# Patient Record
Sex: Female | Born: 1975
Health system: Southern US, Community
[De-identification: ages and names within clinical notes are randomized; demographics above are authoritative.]

## PROBLEM LIST (undated history)

## (undated) DIAGNOSIS — N289 Disorder of kidney and ureter, unspecified: Secondary | ICD-10-CM

## (undated) DIAGNOSIS — E282 Polycystic ovarian syndrome: Secondary | ICD-10-CM

## (undated) DIAGNOSIS — G5602 Carpal tunnel syndrome, left upper limb: Secondary | ICD-10-CM

## (undated) DIAGNOSIS — F329 Major depressive disorder, single episode, unspecified: Secondary | ICD-10-CM

## (undated) DIAGNOSIS — Z87442 Personal history of urinary calculi: Secondary | ICD-10-CM

## (undated) DIAGNOSIS — R05 Cough: Secondary | ICD-10-CM

## (undated) DIAGNOSIS — G44229 Chronic tension-type headache, not intractable: Secondary | ICD-10-CM

## (undated) DIAGNOSIS — E119 Type 2 diabetes mellitus without complications: Secondary | ICD-10-CM

## (undated) DIAGNOSIS — G8929 Other chronic pain: Secondary | ICD-10-CM

## (undated) DIAGNOSIS — I10 Essential (primary) hypertension: Secondary | ICD-10-CM

## (undated) DIAGNOSIS — M545 Low back pain, unspecified: Secondary | ICD-10-CM

## (undated) DIAGNOSIS — E785 Hyperlipidemia, unspecified: Secondary | ICD-10-CM

## (undated) DIAGNOSIS — F32A Depression, unspecified: Secondary | ICD-10-CM

## (undated) DIAGNOSIS — G43909 Migraine, unspecified, not intractable, without status migrainosus: Secondary | ICD-10-CM

## (undated) DIAGNOSIS — F419 Anxiety disorder, unspecified: Secondary | ICD-10-CM

## (undated) HISTORY — PX: WISDOM TOOTH EXTRACTION: SHX21

## (undated) HISTORY — DX: Hyperlipidemia, unspecified: E78.5

## (undated) HISTORY — DX: Migraine, unspecified, not intractable, without status migrainosus: G43.909

---

## 1998-11-06 ENCOUNTER — Other Ambulatory Visit: Admission: RE | Admit: 1998-11-06 | Discharge: 1998-11-06 | Payer: Self-pay | Admitting: Obstetrics & Gynecology

## 2002-09-26 ENCOUNTER — Encounter: Payer: Self-pay | Admitting: Family Medicine

## 2002-09-26 ENCOUNTER — Encounter: Admission: RE | Admit: 2002-09-26 | Discharge: 2002-09-26 | Payer: Self-pay | Admitting: Family Medicine

## 2003-01-30 ENCOUNTER — Other Ambulatory Visit: Admission: RE | Admit: 2003-01-30 | Discharge: 2003-01-30 | Payer: Self-pay | Admitting: Obstetrics and Gynecology

## 2004-03-30 ENCOUNTER — Other Ambulatory Visit: Admission: RE | Admit: 2004-03-30 | Discharge: 2004-03-30 | Payer: Self-pay | Admitting: Obstetrics and Gynecology

## 2005-12-30 ENCOUNTER — Emergency Department (HOSPITAL_COMMUNITY): Admission: EM | Admit: 2005-12-30 | Discharge: 2005-12-30 | Payer: Self-pay | Admitting: Pediatrics

## 2006-05-26 ENCOUNTER — Other Ambulatory Visit: Admission: RE | Admit: 2006-05-26 | Discharge: 2006-05-26 | Payer: Self-pay | Admitting: Obstetrics and Gynecology

## 2006-10-07 ENCOUNTER — Inpatient Hospital Stay (HOSPITAL_COMMUNITY): Admission: AD | Admit: 2006-10-07 | Discharge: 2006-10-07 | Payer: Self-pay | Admitting: Obstetrics and Gynecology

## 2007-01-13 ENCOUNTER — Observation Stay (HOSPITAL_COMMUNITY): Admission: AD | Admit: 2007-01-13 | Discharge: 2007-01-14 | Payer: Self-pay | Admitting: Obstetrics and Gynecology

## 2007-04-19 ENCOUNTER — Ambulatory Visit (HOSPITAL_COMMUNITY): Admission: RE | Admit: 2007-04-19 | Discharge: 2007-04-19 | Payer: Self-pay | Admitting: Obstetrics and Gynecology

## 2007-05-04 ENCOUNTER — Inpatient Hospital Stay (HOSPITAL_COMMUNITY): Admission: AD | Admit: 2007-05-04 | Discharge: 2007-05-09 | Payer: Self-pay | Admitting: Obstetrics and Gynecology

## 2011-03-09 NOTE — Op Note (Signed)
[name], [name]               ACCOUNT NO.:  [id number]   MEDICAL RECORD NO.:  [id number]          PATIENT TYPE:  INP   LOCATION:  9106                          [facility]ILITY:  [facility]   PHYSICIAN:  Hal Morales, M.D.DATE OF BIRTH:  Nov 27, 1975   DATE OF PROCEDURE:  05/06/2007  DATE OF DISCHARGE:                               OPERATIVE REPORT   PREOPERATIVE DIAGNOSIS:  Intrauterine pregnancy at 41-5/7 weeks, arrest  of dilatation.   POSTOPERATIVE DIAGNOSIS:  Intrauterine pregnancy at 41-5/7 weeks, arrest  of dilatation.   OPERATION:  Primary low transverse cesarean section.   SURGEON:  Hal Morales, M.D.   FIRST ASSISTANT:  Erin Sons, certified nurse midwife.   ANESTHESIA:  Spinal.   ESTIMATED BLOOD LOSS:  750 mL.   COMPLICATIONS:  None.   FINDINGS:  The patient was delivered of a female infant whose name is  Ellanor weighing 7 pounds 8 ounces with Apgars of 9 and 9 at 1 in 5  minutes respectively.  The uterus and tubes were normal for the gravid  state.  The ovaries were consistent with polycystic ovarian changes.  The placenta contained an eccentrically inserted three-vessel cord.   PREOPERATIVE DISCUSSION:  The patient had then brought in for induction  of labor on May 04, 2007 and had had cervical ripening overnight with  Cervidil.  She had had Pitocin from approximately 6:00 a.m. until 1:00  a.m. on May 06, 2007.  The Pitocin was discontinued for a short period  of time and restarted again at approximately 5:00 a.m. and continued  through 5:00 p.m.  In spite of contractions that became as close as  every 4 minutes the patient was unable to moved to a place where rupture  of membranes could be achieved.  In light of approximately 2 days of  Pitocin induction.  The patient was given the option of continuing  Pitocin through the evening, replacing Cervidil, or proceeding with  cesarean section.  She opted to proceed with cesarean section.  Several  discussions  had been held concerning the risks and benefits of cesarean  section, especially since the patient had originally been scheduled for  cesarean section because of breech presentation at 43 weeks' gestation.  She and her husband acknowledged understanding of the procedure as well  as the risks and wished to proceed.   PROCEDURE:  The patient was taken to the operating room after  appropriate identification and placed on the operating table.  A spinal  anesthetic was placed.  She was placed in the supine position with a  left lateral tilt.  The abdomen and perineum were prepped with multiple  layers of Betadine and a Foley catheter inserted into the bladder and  connected to straight drainage.  The abdomen was draped as a sterile  field.  After assurance of adequate anesthesia the suprapubic region was  infiltrated with 20 mL of quarter percent Marcaine.  A suprapubic  incision was made and the abdomen opened in layers.  The peritoneum was  entered and the bladder blade placed.  The uterus was incised  approximately 2 cm  above the uterovesical fold and that incision taken  laterally on either side bluntly.  The infant was delivered from the  occiput transverse position with the aid of a kiwi vacuum extractor and  after having the nares and pharynx suctioned and the cord clamped and  cut was handed off to the awaiting pediatricians.  The placenta was  allowed to separate from the uterus and was removed from the operative  field and given to the personnel from [facility].  The  uterine incision was closed with a running interlocking suture of 0  Vicryl.  An imbricating suture of 0 Vicryl was placed.  Several  hemostatic figure-of-eight sutures of 0 Vicryl were required for  adequate hemostasis.  Copious irrigation was carried out.  The abdominal  peritoneum was closed with a running suture of 2-0 Vicryl.  The rectus  muscles were reapproximated in the midline with a  figure-of-eight suture  of 2-0 Vicryl.  The rectus fascia was closed with a running suture of 0  Vicryl then reinforced on either side of midline with figure-of-eight  sutures of 0 Vicryl.  The subcutaneous tissue was copiously irrigated  and made hemostatic with Bovie cautery.  A 7 mm Jackson-Pratt drain was  placed in the subcutaneous tissue and exited through a left lower  quadrant stab wound.  It was sewn in place with a 0 silk suture.  The  skin incision was then closed with a subcuticular suture of 3-0  Monocryl.  Steri-Strips were applied.  The grenade was applied to the  Jackson-Pratt drain and a sterile dressing applied to the incision.  The  patient was taken from the operating room to the recovery room in  satisfactory condition having tolerated the procedure well with sponge  and instrument counts correct.  The infant went to the full-term  nursery.   OPERATIVE REPORT:  Nine Tyrene Nader thank you      Hal Morales, M.D.  Electronically Signed     VPH/MEDQ  D:  05/06/2007  T:  05/07/2007  Job:  [id number]

## 2011-03-09 NOTE — H&P (Signed)
[name], [name]               ACCOUNT NO.:  [id number]   MEDICAL RECORD NO.:  [id number]          PATIENT TYPE:  INP   LOCATION:                                [facility]   PHYSICIAN:  Janine Limbo, M.D.DATE OF BIRTH:  Mar 02, 1976   DATE OF ADMISSION:  05/04/2007  DATE OF DISCHARGE:                              HISTORY & PHYSICAL   [name] is a 35 year old  gravida 1, para 0, at 41-3/7 weeks who  presents tonight for induction secondary to post dates.  Her history has  been remarkable for a previous breech presentation for which the patient  was scheduled for cesarean section on April 20, 2007; however, on  presentation the patient was noted to be vertex.  She therefore was sent  home.  She was then seen in the office this week and was counseled  regarding risks and benefits of induction.  The patient did wish to  proceed with induction tonight.  Pregnancy has been remarkable for:  1. Elevated BMI.  2. Polycystic ovarian syndrome.  3. Anxiety disorder.  4. History of kidney stone.   PRENATAL LABS:  Blood type is O positive, Rh antibody negative, VDRL  nonreactive, rubella titer positive, Hepatitis B Surface Antigen  negative, HIV was nonreactive.  Cystic fibrosis testing was negative.  Toxoplasmosis titers were negative.  Hemoglobin upon entering the  practice was 13.2, it was 11 at 27 weeks.  First trimester screen was  normal, AFP was normal.  The patient had negative urine culture at 16  weeks, Glucola was normal at 27 weeks, Group B Strep culture was also  negative at 36 weeks.   HISTORY OF PRESENT PREGNANCY:  Patient entered care at approximately 10  weeks.  She had an ultrasound for first trimester screening that also  gave and confirmed an [facility] of April 27, 2007.  She had some upper abdominal  pain in early pregnancy.  She had an abdominal ultrasound that showed  mild diffuse fatty infiltration of the liver.  She was referred to Dr.  Loreta Ave.  She continued to  have some issues through 12 weeks, however, this  resolved by 15 weeks and a GI consult was canceled per the patient's  request.  She had a normal first trimester screen and a normal AFP.  She  had an ultrasound at 19 weeks showing normal growth, normal anatomy and  confirmation of EDC with normal cervical length.  Urine culture was sent  at that time and was negative.  Glucola was normal.  She had a kidney  stone at 26 weeks.  She had a urine culture showing multiple species at  29 weeks.  At 35-36 weeks she had an ultrasound for questionable  presentation, fetus was noted to be breech at that time and the patient  elected to not attempt an external version, therefore she was scheduled  for cesarean section on June 26.  However, on that day ultrasound was  performed and the fetus was noted to be vertex.  At that time, the  patient was disappointed in that change in position; however,  she was  counseled regarding the risks and benefits of elective cesarean section  versus awaiting the onset of labor or induction.  Patient did agree with  the plan then to wait for the onset of labor or scheduling of induction  for post dates.  On her exam this week cervix was closed, long, vertex  was at a -2 station.   OBSTETRICAL HISTORY:  The patient is a primigravida.   MEDICAL HISTORY:  1. The patient is a previous oral contraceptive user, she stopped in      July of 2006.  2. She reports usual childhood illnesses.  3. She does have a history of kidney stones and occasional UTI.  4. She does have a history of anxiety.   She has not been on any medication.   PAST SURGICAL HISTORY:  Wisdom teeth removed.   FAMILY HISTORY:  1. Paternal grandmother has varicosities.  2. Paternal grandfather had diabetes.  3. Maternal grandmother had arthritis.  4. Her father had myeloma.  5. Paternal aunt had some type of cancer.  6. Her father is bipolar.   GENETIC HISTORY:  Is remarkable for the patient's  paternal grandfather  having hemophilia and maternal grandmother being a carrier.   SOCIAL HISTORY:  1. The patient is married to the father of the baby.  He is involved      and supportive, his name is Lameka Disla.  2. Patient is Caucasian.  3. She is college educated.  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  5. Her partner is also college educated and is employed in customer      care.  6. She has been followed by the [facility] [place].  7. She denies any alcohol, drug or tobacco use during this pregnancy.   PHYSICAL EXAM:  VITAL SIGNS:  Stable.  Patient is afebrile.  HEENT:  Within normal limits.  LUNGS:  The breath sounds are clear.  HEART:  Regular rate and rhythm without murmur.  BREASTS:  Soft and nontender.  ABDOMEN:  Fundal height is approximately 39 cm, estimated fetal weight  is 7-8 pounds.  Uterine contractions are very occasional and mild and  fetal heart rate on NST yesterday was reactive with no decelerations.  PELVIC EXAM IN THE OFFICE YESTERDAY:  Closed, long, vertex was in a -2  station.  DEEP TENDON REFLEXES:  Are 2+ without clonus, there is a trace edema  noted.   IMPRESSION:  1. Intrauterine pregnancy at 41 and 3/7 weeks.  2. Post dates.  3. Native Group B Streptococcus.   PLAN:  1. Admit to [facility] per consult with Dr.      Stefano Gaul as attending physician.  2. Routine certified nurse midwife orders.  3. Will anticipate implanting Cervidil tonight then with Pitocin to be      begun in the morning.      [name], C.N.M.      Janine Limbo, M.D.  Electronically Signed    VLL/MEDQ  D:  05/04/2007  T:  05/04/2007  Job:  [id number]

## 2011-03-09 NOTE — Discharge Summary (Signed)
NAMECYANI, [name]               ACCOUNT NO.:  [id number]   MEDICAL RECORD NO.:  [id number]          PATIENT TYPE:  INP   LOCATION:  9106                          [facility]   PHYSICIAN:  Osborn Coho, M.D.   DATE OF BIRTH:  [birth year]   DATE OF ADMISSION:  05/04/2007  DATE OF DISCHARGE:  05/09/2007                               DISCHARGE SUMMARY   ADMISSION DIAGNOSES:  1. Intrauterine pregnancy at 41-3/7 weeks.  2. Post dates.   DISCHARGE DIAGNOSES:  1. Intrauterine pregnancy at 41-5/7 weeks.  2. Arrest dilatation.   PROCEDURES:  1. Primary low transverse cesarean section.  2. Spinal anesthesia.   HOSPITAL COURSE:  [name] is a 35 year old gravida 1, para 0, 41-3/7  weeks who was admitted for induction on the evening of May 04, 2007  secondary to post dates. Her history has been remarkable for previous  breech presentation which the patient was scheduled for C-section on  June 26, however, on presentation to the hospital the patient was noted  to be vertex.  She therefore was sent home.  She was then seen in the  office this week and was counseled regarding risks and benefits of  induction.  The patient did wish to proceed with induction.  She was  scheduled for the evening of July 10. Her pregnancy was remarkable for:  1. Elevated BMI.  2. Polycystic ovarian syndrome.  3. Anxiety disorder.  4. History of kidney stone.   HOSPITAL COURSE:  On admission cervix was posterior, closed to  fingertip, long, vertex -2.  Cervidil was placed.  The patient slept  throughout the night and was removed at approximately 10:00 a.m. with  cervix unchanged.  Pitocin was begun at that point. Next examination was  at 7:30 p.m. with cervix 1, 70%, vertex at minus three station. Pitocin  was continued through the evening, however, per patient request it was  discontinued approximately 1:30.  It was restarted again at 5:00 a.m.  throughout the rest of the day the patient continued  to have  contractions but was never uncomfortable. Cervix never changed beyond  fingertip to 1, 60%, vertex at -3.  Artificial rupture of membranes was  never able to be accomplished safely. At 5:00 p.m. the patient's status  was unchanged, contractions were essentially minimal despite being on 20  milliunits of Pitocin. Dr. Pennie Rushing was consulted and the decision was  made to offer the patient cesarean section.  She did wish to proceed  with C-section.  She was taken to the operating room where a primary low  transverse cesarean section was performed by Dr. Pennie Rushing under spinal  anesthesia.  Findings were a viable female by the name of Ellanor,  weight 7 pounds 8 ounces with Apgars of nine and nine. Ovaries were  consistent with polycystic ovarian changes. Placenta contained  eccentrically inserted three-vessel cord. The patient tolerated  procedure well was taken to recovery in good condition.  Infant was  taken to the full-term nursery.  By postop day #1 patient is doing well.  She initially was breast-feeding but then elected to start  bottle  feeding. Her hemoglobin was 10.3 down from 11.5, white blood cell count  13.4, platelet count was 293. Rest of hospital course was uncomplicated.  She did have a JP drain that drained a small amount. By postop day #3  she was doing well.  She was up ad lib.  She was planning oral  contraceptives for contraception.  Her incision was clean, dry and  intact.  Her JP drain was removed without difficulty.  She was deemed to  receive full benefit of hospital stay and was discharged home.  Discharge instructions per [facility] handout.   DISCHARGE MEDICATIONS:  1. Motrin 600 mg p.o. q.6 hours p.r.n. pain.  2. Percocet 5/325 one to two p.o. q.3-4 hours p.r.n. pain.  3. Loestrin 1.5/30 one p.o. daily.   The patient is to follow up at [facility] is in 6 weeks or  p.r.n.      [name], C.N.M.      Osborn Coho,  M.D.  Electronically Signed    VLL/MEDQ  D:  05/09/2007  T:  05/09/2007  Job:  [id number]

## 2011-03-09 NOTE — H&P (Signed)
[name], [name]               ACCOUNT NO.:  [id number]   MEDICAL RECORD NO.:  [id number]          PATIENT TYPE:  AMB   LOCATION:  SDC                           [facility]   PHYSICIAN:  Hal Morales, M.D.DATE OF BIRTH:  Apr 12, 1976   DATE OF ADMISSION:  05/04/2007  DATE OF DISCHARGE:  05/09/2007                              HISTORY & PHYSICAL   [name] is a 35 year old gravida 1, para 0, at 6 weeks, who  presents for scheduled primary cesarean section secondary to breech  presentation.  Pregnancy has been remarkable for:  1)  Elevated BMI.  2)  Polycystic ovarian syndrome disease.  3)  Anxiety disorder.  4)  History  of kidney stone.  5)  Breech presentation.  The infant was noted to be  breech at approximately 35-36 weeks.  The patient declined no external  version attempt; therefore, she was scheduled for C-section at 38 weeks.   PRENATAL LABS:  Blood type is O+.  Rh antibody negative.  VDRL  nonreactive.  Rubella titer positive.  Hepatitis B surface antigen  negative.  HIV was nonreactive.  Cystic fibrosis testing was negative.  Toxoplasmosis titers were negative.  Hemoglobin upon entering the  practice was 13.2.  It was 11 at 27 weeks.  First trimester screen was  normal.  AFP was normal.  Patient had a negative urine culture at 16  weeks.  Glucola was normal at 27 weeks.  Group B strep culture was also  negative at 36 weeks.   HISTORY OF PRESENT PREGNANCY:  Patient entered care at approximately 10  weeks.  She had an ultrasound for first trimester screening that also  gave and confirmed EDC of April 27, 2007.  She had some upper abdominal  pain of early pregnancy.  She had an abdominal ultrasound that showed  mild diffuse fatty infiltration of the liver.  She was referred to Dr.  Loreta Hooper.  She continued to have some issues through 12 weeks; however, this  resolved by 15 weeks and a GI consult was cancelled, per the patient's  request.  She had a normal first  trimester screen and a normal AFP.  She  had an ultrasound at 19 weeks showing normal growth, normal anatomy, and  confirmation of EDC with normal cervical length.  Urine culture was sent  at that time and was negative.  Glucola was normal.  She had a kidney  stone at 26 weeks.  She had a multiple species urine culture at 29  weeks.  At 35-36 weeks, she had an ultrasound for questionable position.  Fetus was noted to be breech at that time, and the patient elected to  not attempt an external version; therefore, she is scheduled for  cesarean section.   OBSTETRICAL HISTORY:  Patient is a primigravida.   MEDICAL HISTORY:  Patient is a previous oral contraceptive user.  She  stopped in July, 2006.  She reports the usual childhood illnesses.  She  does have a history of kidney stones and occasional UTI.  Patient does  have a history of anxiety.   PAST SURGICAL  HISTORY:  Wisdom teeth only, removed.   FAMILY HISTORY:  Paternal grandmother has varicosities.  Paternal  grandfather had diabetes.  Maternal grandmother had arthritis.  Her  father had myeloma.  Paternal aunt had some type of cancer.  Her father  is bipolar.   GENETIC HISTORY:  Remarkable for the paternal grandfather having  hemophilia and the paternal grandmother being a carrier.   SOCIAL HISTORY:  Patient is married to the father of the baby.  He is  involved and supportive.  His name is [name].  Patient is  Caucasian.  She is college-educated.  She is a Secretary/administrator.  Her partner is also college-educated.  He is also  employed in customer care.  She has been followed by the certified nurse  midwife service at [facility].  She denies any alcohol, drug,  or tobacco use during this pregnancy.   PHYSICAL EXAMINATION:  VITAL SIGNS:  Stable.  Patient is afebrile.  HEENT:  Within normal limits.  LUNGS:  Breath sounds are clear.  HEART:  Regular rate and rhythm without murmur.  BREASTS:  Soft and  nontender.  ABDOMEN:  Fundal height is approximately 39 cm.  Estimated fetal weight  is 7-8 pounds.  Uterine contractions are very occasional and mild.  PELVIC:  Deferred.  Fetal heart rates are in the 140s to 150s by Doppler  and office visits.  EXTREMITIES:  Deep tendon reflexes are 2+ without clonus.  There is a  trace edema noted.   IMPRESSION:  1. Intrauterine pregnancy at 38 weeks.  2. Breech presentation with patient declining version.  3. Negative Group B strep.   PLAN:  1. Admit to [facility] for a consult with Dr.      Dierdre Forth as attending physician.  2. Routine physician preoperative orders.  3. Preoperative ultrasound shows vertex presentation.  Discussion with      the parents re options for management:  proceed with elective      cesarean section without medical indication, and discharge home to      await spontaneous labor.  Risks and benefits of each option were      reviewed, and parents decided on discharge home.      Renaldo Reel [name], C.N.M.      Hal Morales, M.D.  Electronically Signed    VLL/MEDQ  D:  04/14/2007  T:  04/14/2007  Job:  [id number]

## 2011-03-12 NOTE — Discharge Summary (Signed)
[name], [name]               ACCOUNT NO.:  [id number]   MEDICAL RECORD NO.:  [id number]          PATIENT TYPE:  OBV   LOCATION:  9319                          [facility]   PHYSICIAN:  Janine Limbo, M.D.DATE OF BIRTH:  06-Jul-1976   DATE OF ADMISSION:  01/13/2007  DATE OF DISCHARGE:  01/14/2007                               DISCHARGE SUMMARY   ADMISSION DIAGNOSES:  1. Intrauterine pregnancy at 25-1/7 weeks.  2. Left renal calculus.   DISCHARGE DIAGNOSES:  1. Intrauterine pregnancy at 25-1/7 weeks.  2. Left renal calculus.   HOSPITAL COURSE:  [name] is a 34 year old primigravida who  presented to [facility] hospital at 25-1/7 weeks; complaining of left flank  and left lower quadrant pain that began on January 12, 2007.  She reported  mild nausea and no vomiting, mild dysuria but no fever.   CURRENT PREGNANCY:  Her pregnancy has been followed by the [facility] service and has been remarkable for:  1. History of renal stones.  2. History of UTI and pyelonephritis.  3. Elevated BMI.  4. Polycystic ovarian syndrome.  5. History of anxiety.   Upon admission the patient's vital signs were stable.  She was afebrile.  Fetal heart tones were tones were in the 150s, with no decelerations.  There were no contractions on the monitor.  Her cervix was long and  closed.  A renal ultrasound showed a 10 mm nonobstructive left renal  calculus, with no hydronephrosis.  A clean catch urinalysis showed  specific gravity less than 1.005, with large hemoglobin.  CBC:  Hemoglobin 11.5, hematocrit 33.4, white blood cell count 13.5, platelets  389,000.  Neutrophils were at the 74th percent.   The patient was given a room for 23-year observation for a consult with  Dr. Stefano Gaul.  She was to receive IV hydration, Ancef 1 gram IV q.6 h.;  as well as pain medications.  Her urine was also to be strained.  On the  morning of January 14, 2007 at approximately 6:00 a.m., she passed  a pea-  sized stone and was feeling better subsequently.  While in the hospital  she was using full-dose Dilaudid PCA.  She was also receiving Zofran as  needed.  Her urine was sent for culture, but the results were  unavailable at the time of her discharge.  She was deemed to have  received a full benefit of her hospital stay and was discharged home.   DISCHARGE MEDICATIONS:  Prenatal vitamin one p.o. daily.   DISCHARGE INSTRUCTIONS:  Preterm labor precautions were reviewed, as  well as to call with signs or symptoms of UTI or kidney stones.   DISCHARGE FOLLOW-UP:  Will occur at [facility] at her next  scheduled visit, or p.r.n.      Cam Hai, C.N.M.      Janine Limbo, M.D.  Electronically Signed    KS/MEDQ  D:  01/14/2007  T:  01/14/2007  Job:  [id number]

## 2011-03-12 NOTE — H&P (Signed)
[name], ADINOLFI               ACCOUNT NO.:  [id number]   MEDICAL RECORD NO.:  [id number]          PATIENT TYPE:  OBV   LOCATION:  9319                          FACILITY:  [facility]   PHYSICIAN:  Janine Limbo, M.D.DATE OF BIRTH:  12-03-1975   DATE OF ADMISSION:  01/13/2007  DATE OF DISCHARGE:                              HISTORY & PHYSICAL   [name] is a 35 year old gravida 1, para 0 at 25-1/7 weeks who  presented complaining of left flank and left lower quadrant pain since  last night.  She reports mild nausea, no vomiting.  Mild dysuria this  a.m. but no fever and reports positive fetal movement and denies  contractions.  Pregnancy has been remarkable for:  1. History of renal stones.  2. History of UTI and pyelonephritis.  3. Elevated BMI.  4. Polycystic ovarian syndrome.  5. History of anxiety.   PRENATAL LABS:  Blood type is O+, RH antibody negative, VDRL  nonreactive, rubella titer positive, hepatitis B surface antigen  negative. HIV was nonreactive.  Cystic fibrosis testing was negative.  Toxoplasmosis titers were negative, GC Chlamydia cultures were declined  in December  Pap was normal in August.  Hemoglobin upon entering the  practice was 13.2, platelets were 392.  First trimester screen was  normal.  AFT was done and was also normal.   HISTORY OF PRESENT PREGNANCY:  The patient entered care at approximately  10 weeks.  She had an ultrasound for viability at 11 weeks showing EDC  of 7308.  First trimester screen was desired and was normal.  She was  seen for upper abdominal pain in December, was using Vicodin after an  MAU visit.  She had resolution of this.  Initially they were just going  to be a referral to Dr. Christella Hartigan at [facility].  However, then with  resolution of the pain that was cancelled.  She had a urine culture done  at approximately 15 weeks that was negative.  Subsequent to that she did  get started on amoxicillin for a UTI at urgent care.  She  had another  ultrasound at 19 weeks showing normal growth and development.  Rest of  pregnancy was essentially uncomplicated until this pain began last  night.   OBSTETRICAL HISTORY:  The patient is a prima gravida.   MEDICAL HISTORY:  She used oral contraceptives and stopped in 04/2005.  She does have anxiety disorder but is not on any current medications.  She has had a kidney stone in the past.  She has had occasional UTI.  Her only other surgery was wisdom teeth removed in the past.   ALLERGIES:  None.   FAMILY HISTORY:  Paternal grandmother had varicosities.  Paternal  grandfather had diabetes. Her maternal grandmother has arthritis.  Her  father had myeloma.  Paternal aunt had breast cancer.  Her father is  bipolar.   GENETIC HISTORY:  Remarkable for the patient's paternal grandfather  having hemophilia with her maternal grandmother being a carrier.  The  patient's parents were screened and were negative.   SOCIAL HISTORY:  The patient is  married to the father of the baby.  He  is involved and his name is Eris Hannan.  The patient is Caucasian.  She is college educated.  She is employed in customer care.  Her husband  is also college educated. He is also employed in customer care.  She has  been followed by certified nurse midwife service in [place].  She denies any alcohol, drug or tobacco use during this pregnancy.   PHYSICAL EXAMINATION:  VITAL SIGNS:  Stable.  The patient is afebrile.  HEENT:  Within normal limits.  LUNGS:  Breath sounds are clear.  HEART:  Regular rate and rhythm without murmurs.  BREASTS:  Soft, nontender.  ABDOMEN:  Fundal height is approximately 25-26 cm.  Abdomen is soft,  nontender except for a small amount of mild tenderness in the left lower  quadrant.  There is no rebound or guarding noted.  There is negative CVA  tenderness noted.  PELVIC EXAM:  Shows cervix is long and closed.  Fetal  heart rate is in the 150s, no  decelerations, no uterine contractions on  the monitor.  EXTREMITIES:  Deep tendon reflexes are 2+ without clonus.  There is a  trace to 1+ edema noted in the lower extremities.   CBC showed hemoglobin 11.5, hematocrit of 33.4, white blood cell count  of 13.5 and platelet count of 389.  Neutrophil count was 74%.   IMPRESSION:  1. Intrauterine pregnancy at 25 weeks.  2. Left renal calculus.   PLAN:  1. Admitted for 23 hour observation for consult with Dr. Debria Garret      attending physician.  2. IV hydration.  3. Ancef 1 g IV q. 6.  4. Pain medication.  5. Strain all urine.  6. Reevaluate tomorrow or p.r.n.      [name], C.N.M.      Janine Limbo, M.D.  Electronically Signed    VLL/MEDQ  D:  01/13/2007  T:  01/13/2007  Job:  [id number]

## 2011-08-10 LAB — CCBB MATERNAL DONOR DRAW

## 2011-08-10 LAB — CBC
HCT: 34 — ABNORMAL LOW
Hemoglobin: 10.3 — ABNORMAL LOW
Hemoglobin: 11.5 — ABNORMAL LOW
MCHC: 33.8
MCHC: 33.9
MCV: 82.1
Platelets: 293
Platelets: 350
RBC: 4.14
RDW: 14.5 — ABNORMAL HIGH
RDW: 14.7 — ABNORMAL HIGH
WBC: 12.8 — ABNORMAL HIGH

## 2011-08-10 LAB — RPR: RPR Ser Ql: NONREACTIVE

## 2011-08-11 LAB — DIFFERENTIAL
Basophils Relative: 0
Eosinophils Absolute: 0.1
Lymphs Abs: 2.2
Neutro Abs: 7.5
Neutrophils Relative %: 71

## 2011-08-11 LAB — CBC
MCV: 82.6
Platelets: 342
WBC: 10.6 — ABNORMAL HIGH

## 2011-08-11 LAB — RPR: RPR Ser Ql: NONREACTIVE

## 2011-09-08 ENCOUNTER — Encounter: Payer: Self-pay | Admitting: *Deleted

## 2011-09-08 ENCOUNTER — Encounter: Payer: BC Managed Care – PPO | Attending: Family Medicine | Admitting: *Deleted

## 2011-09-08 DIAGNOSIS — E669 Obesity, unspecified: Secondary | ICD-10-CM | POA: Insufficient documentation

## 2011-09-08 DIAGNOSIS — Z713 Dietary counseling and surveillance: Secondary | ICD-10-CM | POA: Insufficient documentation

## 2011-09-08 NOTE — Progress Notes (Signed)
  Medical Nutrition Therapy:  Appt start time: 0900 end time:  1000.   Assessment:  Primary concerns today: Patient states she is trying very hard to lose weight with no success. 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 She is exercising 5 days a week aerobically for 50 minutes. She also states she has PCOS with insulin resistance. She is in process of changing her birth control meds which could help with weight loss efforts. She states she has been on Metformin in the past but it made her very nauseated. She plans to resume the Metformin in the extended release form as soon as the birth control meds are settled.   MEDICATIONS: see list   DIETARY INTAKE:  Usual eating pattern includes 3 meals and 0-1 snacks per day.  Everyday foods include good variety of all food groups.  Avoided foods include fried foods, sweets, regular sodas.    24-hr recall:  B ( AM): cheerios and 4 oz skim milk  Snk ( AM): none  L ( PM): lean meat sandwich on whole grain bread with 1 slice, cheese, occasionally pita chips or fresh fruit Snk ( PM): none D ( PM): lean meat, vegetable and 1 starch serving Snk ( PM): small bag of popcorn Beverages: Diet Soda  Usual physical activity: 50 minutes aerobic exercise at the gym  Estimated energy needs: 1200 calories 135 g carbohydrates 90 g protein 33 g fat  Progress Towards Goal(s):  In progress.   Nutritional Diagnosis:  NI-1.7 Predicted excessive energy intake As related to weight loss efforts.  As evidenced by lack of weight loss with aerobic exercise 5 days a week.    Intervention:  Nutrition counseling provided on nutrient content of all food groups and role carbohydrate and protein plan in energy metabolism. Recommend a Low Carb High Protein plan for the next 2 weeks to jump start her metabolism further. This plan calls for 1 Carb Choice per meal, and 2-4 oz protein per meal. Also suggest high protein  choices for snacks and increased fluid intake of at least 8 oz per day.  Handouts given during visit include:  Carb Counting and Beyond handout  Meal Planning Card with portion recommendations included  Label Reading handout  Monitoring/Evaluation:  Dietary intake of low carb, high protein foods, exercise, and body weight in 2 week(s).

## 2011-09-08 NOTE — Patient Instructions (Signed)
Goals:  Eat 3 meals/day, Avoid meal skipping   Increase protein rich foods  Limit carbohydrate1 servings/meal   Choose more whole grains, lean protein, low-fat dairy, and fruits/non-starchy vegetables.   Continue with 50 min of physical activity 5 days/week  Continue avoiding sugar-sweetened beverages and concentrated sweets  Increase fluid intake by 8 oz per day.

## 2011-09-22 ENCOUNTER — Ambulatory Visit: Payer: BC Managed Care – PPO | Admitting: *Deleted

## 2011-10-01 ENCOUNTER — Other Ambulatory Visit: Payer: Self-pay | Admitting: Family Medicine

## 2011-10-01 DIAGNOSIS — M545 Low back pain: Secondary | ICD-10-CM

## 2011-10-04 ENCOUNTER — Other Ambulatory Visit: Payer: BC Managed Care – PPO

## 2011-10-12 ENCOUNTER — Emergency Department (HOSPITAL_COMMUNITY)
Admission: EM | Admit: 2011-10-12 | Discharge: 2011-10-13 | Disposition: A | Discharge status: Home / Self Care | Payer: BC Managed Care – PPO | Attending: Emergency Medicine | Admitting: Emergency Medicine

## 2011-10-12 ENCOUNTER — Emergency Department (HOSPITAL_COMMUNITY): Payer: BC Managed Care – PPO

## 2011-10-12 ENCOUNTER — Encounter (HOSPITAL_COMMUNITY): Payer: Self-pay | Admitting: *Deleted

## 2011-10-12 DIAGNOSIS — K5730 Diverticulosis of large intestine without perforation or abscess without bleeding: Secondary | ICD-10-CM | POA: Insufficient documentation

## 2011-10-12 DIAGNOSIS — N200 Calculus of kidney: Secondary | ICD-10-CM

## 2011-10-12 DIAGNOSIS — N390 Urinary tract infection, site not specified: Secondary | ICD-10-CM | POA: Insufficient documentation

## 2011-10-12 DIAGNOSIS — R109 Unspecified abdominal pain: Secondary | ICD-10-CM | POA: Insufficient documentation

## 2011-10-12 DIAGNOSIS — K7689 Other specified diseases of liver: Secondary | ICD-10-CM | POA: Insufficient documentation

## 2011-10-12 DIAGNOSIS — N201 Calculus of ureter: Secondary | ICD-10-CM | POA: Insufficient documentation

## 2011-10-12 MED ORDER — SODIUM CHLORIDE 0.9 % IV BOLUS (SEPSIS)
1000.0000 mL | Freq: Once | INTRAVENOUS | Status: AC
  Administered 2011-10-12: 1000 mL via INTRAVENOUS

## 2011-10-12 MED ORDER — HYDROMORPHONE HCL PF 1 MG/ML IJ SOLN
1.0000 mg | Freq: Once | INTRAMUSCULAR | Status: AC
  Administered 2011-10-12: 1 mg via INTRAVENOUS
  Filled 2011-10-12: qty 1

## 2011-10-12 MED ORDER — ONDANSETRON HCL 4 MG/2ML IJ SOLN
4.0000 mg | Freq: Once | INTRAMUSCULAR | Status: AC
  Administered 2011-10-12: 4 mg via INTRAVENOUS
  Filled 2011-10-12: qty 2

## 2011-10-12 MED ORDER — ONDANSETRON 8 MG PO TBDP
8.0000 mg | ORAL_TABLET | Freq: Once | ORAL | Status: AC
  Administered 2011-10-12: 8 mg via ORAL
  Filled 2011-10-12: qty 1

## 2011-10-12 NOTE — ED Notes (Signed)
MD at bedside. 

## 2011-10-12 NOTE — ED Notes (Signed)
Patient transported to CT 

## 2011-10-12 NOTE — ED Notes (Signed)
Pt in c/o left flank pain x1 hour, with nausea, states she has a history of kidney stones but this pain feels worse

## 2011-10-12 NOTE — ED Provider Notes (Addendum)
History     CSN: [id number] Arrival date & time: 10/12/2011 10:22 PM   First MD Initiated Contact with Patient 10/12/11 2301      Chief Complaint  Patient presents with  . Flank Pain    (Consider location/radiation/quality/duration/timing/severity/associated sxs/prior treatment) HPI Comments: Patient has a history of kidney stones the last one was 2-3 years ago when she was pregnant with her daughter. Over the last 2 weeks she's had intermittent pain and was seen at an outside emergency room 10 days ago and found to have multiple renal stones no obstructing stones at that time. She states the pain rapidly worsened today about one hour prior to arrival to the ER causing her to vomit and radiating down into her groin. She states her pain is 10 out of 10. She's had several episodes of vomiting. No fever, dysuria, diarrhea. She states that over the last one month she has been taking multiple antibiotics for persistent UTI.  Patient is a 35 y.o. female presenting with flank pain. The history is provided by the patient.  Flank Pain This is a recurrent problem. The current episode started less than 1 hour ago. The problem occurs constantly. The problem has been gradually worsening. Associated symptoms include abdominal pain. Pertinent negatives include no headaches and no shortness of breath. The symptoms are aggravated by nothing. The symptoms are relieved by nothing. She has tried acetaminophen for the symptoms. The treatment provided no relief.    Past Medical History  Diagnosis Date  . PCOS (polycystic ovarian syndrome)     Past Surgical History  Procedure Date  . Cesarean section     History reviewed. No pertinent family history.  History  Substance Use Topics  . Smoking status: Former Smoker    Quit date: 09/07/1998  . Smokeless tobacco: Never Used  . Alcohol Use: No    OB History    Grav Para Term Preterm Abortions TAB SAB Ect Mult Living                  Review of  Systems  Respiratory: Negative for shortness of breath.   Gastrointestinal: Positive for abdominal pain.  Genitourinary: Positive for flank pain.  Neurological: Negative for headaches.  All other systems reviewed and are negative.    Allergies  Review of patient's allergies indicates no known allergies.  Home Medications   Current Outpatient Rx  Name Route Sig Dispense Refill  . CALCIUM CARBONATE ANTACID 420 MG PO CHEW Oral Chew 420 mg by mouth.      . ESTRADIOL CYPIONATE 5 MG/ML IM OIL Intramuscular Inject into the muscle every 28 (twenty-eight) days.      . MULTI-VITAMIN/MINERALS PO TABS Oral Take 1 tablet by mouth daily.      . SERTRALINE HCL 100 MG PO TABS Oral Take 150 mg by mouth daily.      . TRAZODONE HCL 150 MG PO TABS Oral Take 150 mg by mouth at bedtime.        BP 154/84  Pulse 105  Temp(Src) 99 F (37.2 C) (Oral)  Resp 22  SpO2 97%  Physical Exam  Nursing note and vitals reviewed. Constitutional: She is oriented to person, place, and time. She appears well-developed and well-nourished. She appears distressed.       Writhing around in the bed in pain  HENT:  Head: Normocephalic and atraumatic.  Mouth/Throat: Mucous membranes are dry.  Eyes: EOM are normal. Pupils are equal, round, and reactive to light.  Cardiovascular: Normal rate,  regular rhythm, normal heart sounds and intact distal pulses.  Exam reveals no friction rub.   No murmur heard. Pulmonary/Chest: Effort normal and breath sounds normal. She has no wheezes. She has no rales.  Abdominal: Soft. Bowel sounds are normal. She exhibits no distension. There is no tenderness. There is CVA tenderness. There is no rebound and no guarding.       Left CVA tenderness.  No LLQ or suprapubic pain  Musculoskeletal: Normal range of motion. She exhibits no tenderness.       No edema  Neurological: She is alert and oriented to person, place, and time. No cranial nerve deficit.  Skin: Skin is warm and dry. No rash  noted.  Psychiatric: She has a normal mood and affect. Her behavior is normal.    ED Course  Procedures (including critical care time)  Labs Reviewed  CBC - Abnormal; Notable for the following:    WBC 14.0 (*) WHITE COUNT CONFIRMED ON SMEAR   All other components within normal limits  DIFFERENTIAL - Abnormal; Notable for the following:    Neutro Abs 8.1 (*)    Lymphs Abs 4.5 (*)    Monocytes Absolute 1.3 (*)    All other components within normal limits  URINALYSIS, ROUTINE W REFLEX MICROSCOPIC - Abnormal; Notable for the following:    APPearance CLOUDY (*)    Hgb urine dipstick LARGE (*)    Protein, ur 30 (*)    Leukocytes, UA MODERATE (*)    All other components within normal limits  URINE MICROSCOPIC-ADD ON - Abnormal; Notable for the following:    Squamous Epithelial / LPF FEW (*)    Bacteria, UA FEW (*)    All other components within normal limits  COMPREHENSIVE METABOLIC PANEL  URINE CULTURE   No results found.   No diagnosis found.    MDM   Pt with symptoms consistent with kidney stone.  Denies infectious sx, or GI symptoms.  Low concern for diverticulitis and no risk factors or history suggestive of AAA.  No hx suggestive of GU source (discharge) and otherwise pt is healthy.  As the patient seem 10 days ago and had a CT done at an outside hospital which showed multiple stones in the kidneys to 6 mm. Patient's pain got increasingly worse today she is writhing in pain today and states it's been worse the last 1-2 hours. Will hydrate, treat pain and ensure no infection with UA, CBC, CMP and will get stone study to further eval due to concern for obstruction.  12:34 AM Patient found to have a 4 mm left stone. UA with moderate leukocytes 11-20 white blood cells. With a mild leukocytosis of 14,000. On repeat evaluation patient was still in pain so given a second milligram of Dilaudid and Toradol.   2:22 AM On reevaluation the patient feels much better. She's tolerating  orals. Will give by mouth Percocet. Speaking with patient she is had multiple rounds of antibiotics.  Infection over the last month including Cipro, Levaquin, Bactrim without clearance of the urinary tract infection. She states that they've been cultures but were able to grow out enough to know the sensitivities. We will send a urine culture today. Discussed that she will need to followup with urology for further evaluation of these frequent UTIs to see if it is related to the stones. She has an appointment with her GP tomorrow which she is going to keep. She will return here for any fever, vomiting, worsening pain. Will prescribe Vantin  for UTI.    Gwyneth Sprout, MD 10/12/11 1610  Gwyneth Sprout, MD 10/13/11 [phone]

## 2011-10-12 NOTE — ED Notes (Signed)
Patient returned from CT scan.

## 2011-10-13 LAB — COMPREHENSIVE METABOLIC PANEL
ALT: 21 U/L (ref 0–35)
AST: 32 U/L (ref 0–37)
Albumin: 3.9 g/dL (ref 3.5–5.2)
Alkaline Phosphatase: 65 U/L (ref 39–117)
Potassium: 4 mEq/L (ref 3.5–5.1)
Sodium: 136 mEq/L (ref 135–145)
Total Protein: 7.7 g/dL (ref 6.0–8.3)

## 2011-10-13 LAB — URINE MICROSCOPIC-ADD ON

## 2011-10-13 LAB — CBC
HCT: 37.3 % (ref 36.0–46.0)
Platelets: 335 10*3/uL (ref 150–400)
RBC: 4.59 MIL/uL (ref 3.87–5.11)
RDW: 14.6 % (ref 11.5–15.5)
WBC: 14 10*3/uL — ABNORMAL HIGH (ref 4.0–10.5)

## 2011-10-13 LAB — URINALYSIS, ROUTINE W REFLEX MICROSCOPIC
Bilirubin Urine: NEGATIVE
Glucose, UA: NEGATIVE mg/dL
Ketones, ur: NEGATIVE mg/dL
pH: 6 (ref 5.0–8.0)

## 2011-10-13 LAB — DIFFERENTIAL
Eosinophils Absolute: 0.1 10*3/uL (ref 0.0–0.7)
Lymphocytes Relative: 32 % (ref 12–46)
Monocytes Absolute: 1.3 10*3/uL — ABNORMAL HIGH (ref 0.1–1.0)
Monocytes Relative: 9 % (ref 3–12)

## 2011-10-13 MED ORDER — HYDROMORPHONE HCL PF 1 MG/ML IJ SOLN
1.0000 mg | Freq: Once | INTRAMUSCULAR | Status: AC
  Administered 2011-10-13: 1 mg via INTRAVENOUS
  Filled 2011-10-13: qty 1

## 2011-10-13 MED ORDER — OXYCODONE-ACETAMINOPHEN 5-325 MG PO TABS
2.0000 | ORAL_TABLET | Freq: Once | ORAL | Status: AC
  Administered 2011-10-13: 2 via ORAL
  Filled 2011-10-13: qty 2

## 2011-10-13 MED ORDER — TAMSULOSIN HCL 0.4 MG PO CAPS: 0.4000 mg | ORAL_CAPSULE | ORAL | Status: DC

## 2011-10-13 MED ORDER — KETOROLAC TROMETHAMINE 30 MG/ML IJ SOLN
30.0000 mg | Freq: Once | INTRAMUSCULAR | Status: AC
  Administered 2011-10-13: 30 mg via INTRAVENOUS
  Filled 2011-10-13: qty 1

## 2011-10-13 MED ORDER — CEFPODOXIME PROXETIL 200 MG PO TABS: 200.0000 mg | ORAL_TABLET | Freq: Two times a day (BID) | ORAL | Status: AC

## 2011-10-13 MED ORDER — OXYCODONE-ACETAMINOPHEN 5-325 MG PO TABS: 1.0000 | ORAL_TABLET | Freq: Four times a day (QID) | ORAL | Status: AC | PRN

## 2011-10-13 NOTE — ED Notes (Signed)
MD at bedside. 

## 2011-10-14 LAB — URINE CULTURE
Colony Count: >=100000
Culture  Setup Time: 201212190623

## 2011-11-01 ENCOUNTER — Other Ambulatory Visit: Payer: Self-pay | Admitting: Urology

## 2011-11-02 ENCOUNTER — Encounter (HOSPITAL_COMMUNITY): Payer: Self-pay | Admitting: *Deleted

## 2011-11-02 NOTE — Pre-Procedure Instructions (Signed)
Patient instructed to bring blue folder, driver, insurance card, picture ID. To arrive in [place]  At 1415. NPO for solid food at midnight Sunday. MAY HAVE CLEAR LIQUIDS UNTIL !000 AM ON  Monday. Not to take any aspirin, Ibuprofen, vitamins etc until after the procedure. Patient to follow instructions for laxative Sunday afternoon.. Patient verbalized understanding of instructions.

## 2011-11-04 ENCOUNTER — Encounter (HOSPITAL_COMMUNITY): Payer: Self-pay

## 2011-11-08 ENCOUNTER — Encounter (HOSPITAL_COMMUNITY): Admission: RE | Payer: Self-pay | Source: Ambulatory Visit

## 2011-11-08 ENCOUNTER — Ambulatory Visit (HOSPITAL_COMMUNITY): Admission: RE | Admit: 2011-11-08 | Payer: 59 | Source: Ambulatory Visit | Admitting: Urology

## 2011-11-08 SURGERY — LITHOTRIPSY, ESWL
Anesthesia: LOCAL | Laterality: Right

## 2011-11-17 ENCOUNTER — Encounter (HOSPITAL_COMMUNITY): Payer: Self-pay | Admitting: *Deleted

## 2011-11-17 NOTE — Progress Notes (Signed)
No Aspirin products after Thursday 11/18/11. Pt reminded to take laxative Sunday PM

## 2011-11-22 ENCOUNTER — Encounter (HOSPITAL_COMMUNITY)
Admission: RE | Disposition: A | Discharge status: Home / Self Care | Payer: Self-pay | Source: Ambulatory Visit | Attending: Urology

## 2011-11-22 ENCOUNTER — Ambulatory Visit (HOSPITAL_COMMUNITY)
Admission: RE | Admit: 2011-11-22 | Discharge: 2011-11-22 | Disposition: A | Discharge status: Home / Self Care | Payer: 59 | Source: Ambulatory Visit | Attending: Urology | Admitting: Urology

## 2011-11-22 ENCOUNTER — Ambulatory Visit (HOSPITAL_COMMUNITY): Payer: 59

## 2011-11-22 ENCOUNTER — Encounter (HOSPITAL_COMMUNITY): Payer: Self-pay | Admitting: *Deleted

## 2011-11-22 DIAGNOSIS — N200 Calculus of kidney: Secondary | ICD-10-CM | POA: Insufficient documentation

## 2011-11-22 DIAGNOSIS — E669 Obesity, unspecified: Secondary | ICD-10-CM | POA: Insufficient documentation

## 2011-11-22 HISTORY — DX: Anxiety disorder, unspecified: F41.9

## 2011-11-22 HISTORY — PX: EXTRACORPOREAL SHOCK WAVE LITHOTRIPSY: SHX1557

## 2011-11-22 LAB — PREGNANCY, URINE: Preg Test, Ur: NEGATIVE

## 2011-11-22 SURGERY — LITHOTRIPSY, ESWL
Anesthesia: LOCAL | Laterality: Right

## 2011-11-22 MED ORDER — CIPROFLOXACIN HCL 500 MG PO TABS
500.0000 mg | ORAL_TABLET | ORAL | Status: AC
  Administered 2011-11-22: 500 mg via ORAL

## 2011-11-22 MED ORDER — DIAZEPAM 5 MG PO TABS
10.0000 mg | ORAL_TABLET | ORAL | Status: AC
  Administered 2011-11-22: 10 mg via ORAL

## 2011-11-22 MED ORDER — OXYCODONE-ACETAMINOPHEN 5-325 MG PO TABS: 1.0000 | ORAL_TABLET | ORAL | Status: AC | PRN

## 2011-11-22 MED ORDER — DEXTROSE-NACL 5-0.45 % IV SOLN
INTRAVENOUS | Status: DC
  Administered 2011-11-22: 17:00:00 via INTRAVENOUS

## 2011-11-22 MED ORDER — CIPROFLOXACIN HCL 500 MG PO TABS
ORAL_TABLET | ORAL | Status: AC
  Administered 2011-11-22: 500 mg via ORAL
  Filled 2011-11-22: qty 1

## 2011-11-22 MED ORDER — DIPHENHYDRAMINE HCL 25 MG PO CAPS
25.0000 mg | ORAL_CAPSULE | ORAL | Status: AC
  Administered 2011-11-22: 25 mg via ORAL

## 2011-11-22 MED ORDER — DIAZEPAM 5 MG PO TABS
ORAL_TABLET | ORAL | Status: AC
  Administered 2011-11-22: 10 mg via ORAL
  Filled 2011-11-22: qty 2

## 2011-11-22 MED ORDER — DIPHENHYDRAMINE HCL 25 MG PO CAPS
ORAL_CAPSULE | ORAL | Status: AC
  Administered 2011-11-22: 25 mg via ORAL
  Filled 2011-11-22: qty 1

## 2011-11-22 NOTE — Brief Op Note (Signed)
11/22/2011  5:43 PM  PATIENT:  [name]  36 y.o. female  PRE-OPERATIVE DIAGNOSIS:  Right Nephrolithiasis  POST-OPERATIVE DIAGNOSIS:  Right nephrolithiasis  PROCEDURE:  Procedure(s): Right EXTRACORPOREAL SHOCK WAVE LITHOTRIPSY (ESWL)  SURGEON:  Surgeon(s): Milford Cage, MD  PHYSICIAN ASSISTANT:   ASSISTANTS: none   ANESTHESIA:   IV sedation  EBL:     BLOOD ADMINISTERED:none  DRAINS: none   LOCAL MEDICATIONS USED:  NONE  SPECIMEN:  No Specimen  DISPOSITION OF SPECIMEN:  N/A  COUNTS:  YES  TOURNIQUET:  * No tourniquets in log *  DICTATION: .Note written in paper chart  PLAN OF CARE: Discharge to home after PACU  PATIENT DISPOSITION:  PACU - hemodynamically stable.   Delay start of Pharmacological VTE agent (>24hrs) due to surgical blood loss or risk of bleeding:  Yes

## 2011-11-22 NOTE — H&P (Signed)
Urology History and Physical Exam  CC: Right nephrolithiasis  HPI: 36 year old female.  History of bilateral nephrolithiasis.  She had a right upper pole 6mm stone.  She has passed other stones.  After discussion of management options, she had elected for shock wave lithotripsy of this right upper pole stone. Urine culture from 11-01-11 was negative for growth.  PMH: Past Medical History  Diagnosis Date  . PCOS (polycystic ovarian syndrome)   . Anxiety     takes Zoloft  . Chronic kidney disease     right renal stone    PSH: Past Surgical History  Procedure Date  . Cesarean section     Allergies: No Known Allergies  Medications: No prescriptions prior to admission  Microgestin 1/20 tabs Sertraline 100mg  Trazodone 150 mg   Social History: History   Social History  . Marital Status: Married    Spouse Name: N/A    Number of Children: N/A  . Years of Education: N/A   Occupational History  . Not on file.   Social History Main Topics  . Smoking status: Former Smoker    Quit date: 09/07/1998  . Smokeless tobacco: Never Used  . Alcohol Use: Yes     very occasionally  . Drug Use: Not on file  . Sexually Active: Not on file   Other Topics Concern  . Not on file   Social History Narrative  . No narrative on file    Family History: History reviewed. No pertinent family history.  Review of Systems: Positive: None Negative: Chest pain, gross hematuria.  A further 10 point review of systems was negative except what is listed in the HPI.  Physical Exam:  General: No acute distress.  Awake. Head:  Normocephalic.  Atraumatic. ENT:  EOMI.  Mucous membranes moist Neck:  Supple.  No lymphadenopathy. CV:  S1 present. S2 present. Regular rate. Pulmonary: Equal effort bilaterally.  Clear to auscultation bilaterally. Abdomen: Soft.  Non- tender to palpation. Skin:  Normal turgor.  No visible rash. Extremity: No gross deformity of bilateral upper extremities.  No  gross deformity of    bilateral lower extremities. Neurologic: Alert. Appropriate mood.   Studies:  No results found for this basename: HGB:2,WBC:2,PLT:2 in the last 72 hours  No results found for this basename: NA:2,K:2,CL:2,CO2:2,BUN:2,CREATININE:2,CALCIUM:2,MAGNESIUM:2,GFRNONAA:2,GFRAA:2 in the last 72 hours   No results found for this basename: PT:2,INR:2,APTT:2 in the last 72 hours   No components found with this basename: ABG:2    Assessment:  Right upper pole nephrolithiasis.  Plan: -Right upper pole shock wave lithotripsy.

## 2012-01-05 ENCOUNTER — Ambulatory Visit
Admission: RE | Admit: 2012-01-05 | Discharge: 2012-01-05 | Disposition: A | Discharge status: Home / Self Care | Payer: 59 | Source: Ambulatory Visit | Attending: Family Medicine | Admitting: Family Medicine

## 2012-01-05 ENCOUNTER — Other Ambulatory Visit: Payer: Self-pay | Admitting: Family Medicine

## 2012-01-05 DIAGNOSIS — R509 Fever, unspecified: Secondary | ICD-10-CM

## 2012-01-05 DIAGNOSIS — R05 Cough: Secondary | ICD-10-CM

## 2012-02-09 IMAGING — CR DG ABDOMEN 1V
2 series · 2 of 2 positions shown · non-contrast
Comparison: 10/29/2011

CLINICAL DATA: Right-sided lithotripsy.

ABDOMEN - 1 VIEW

[t abdomen supine (1 of 2)]
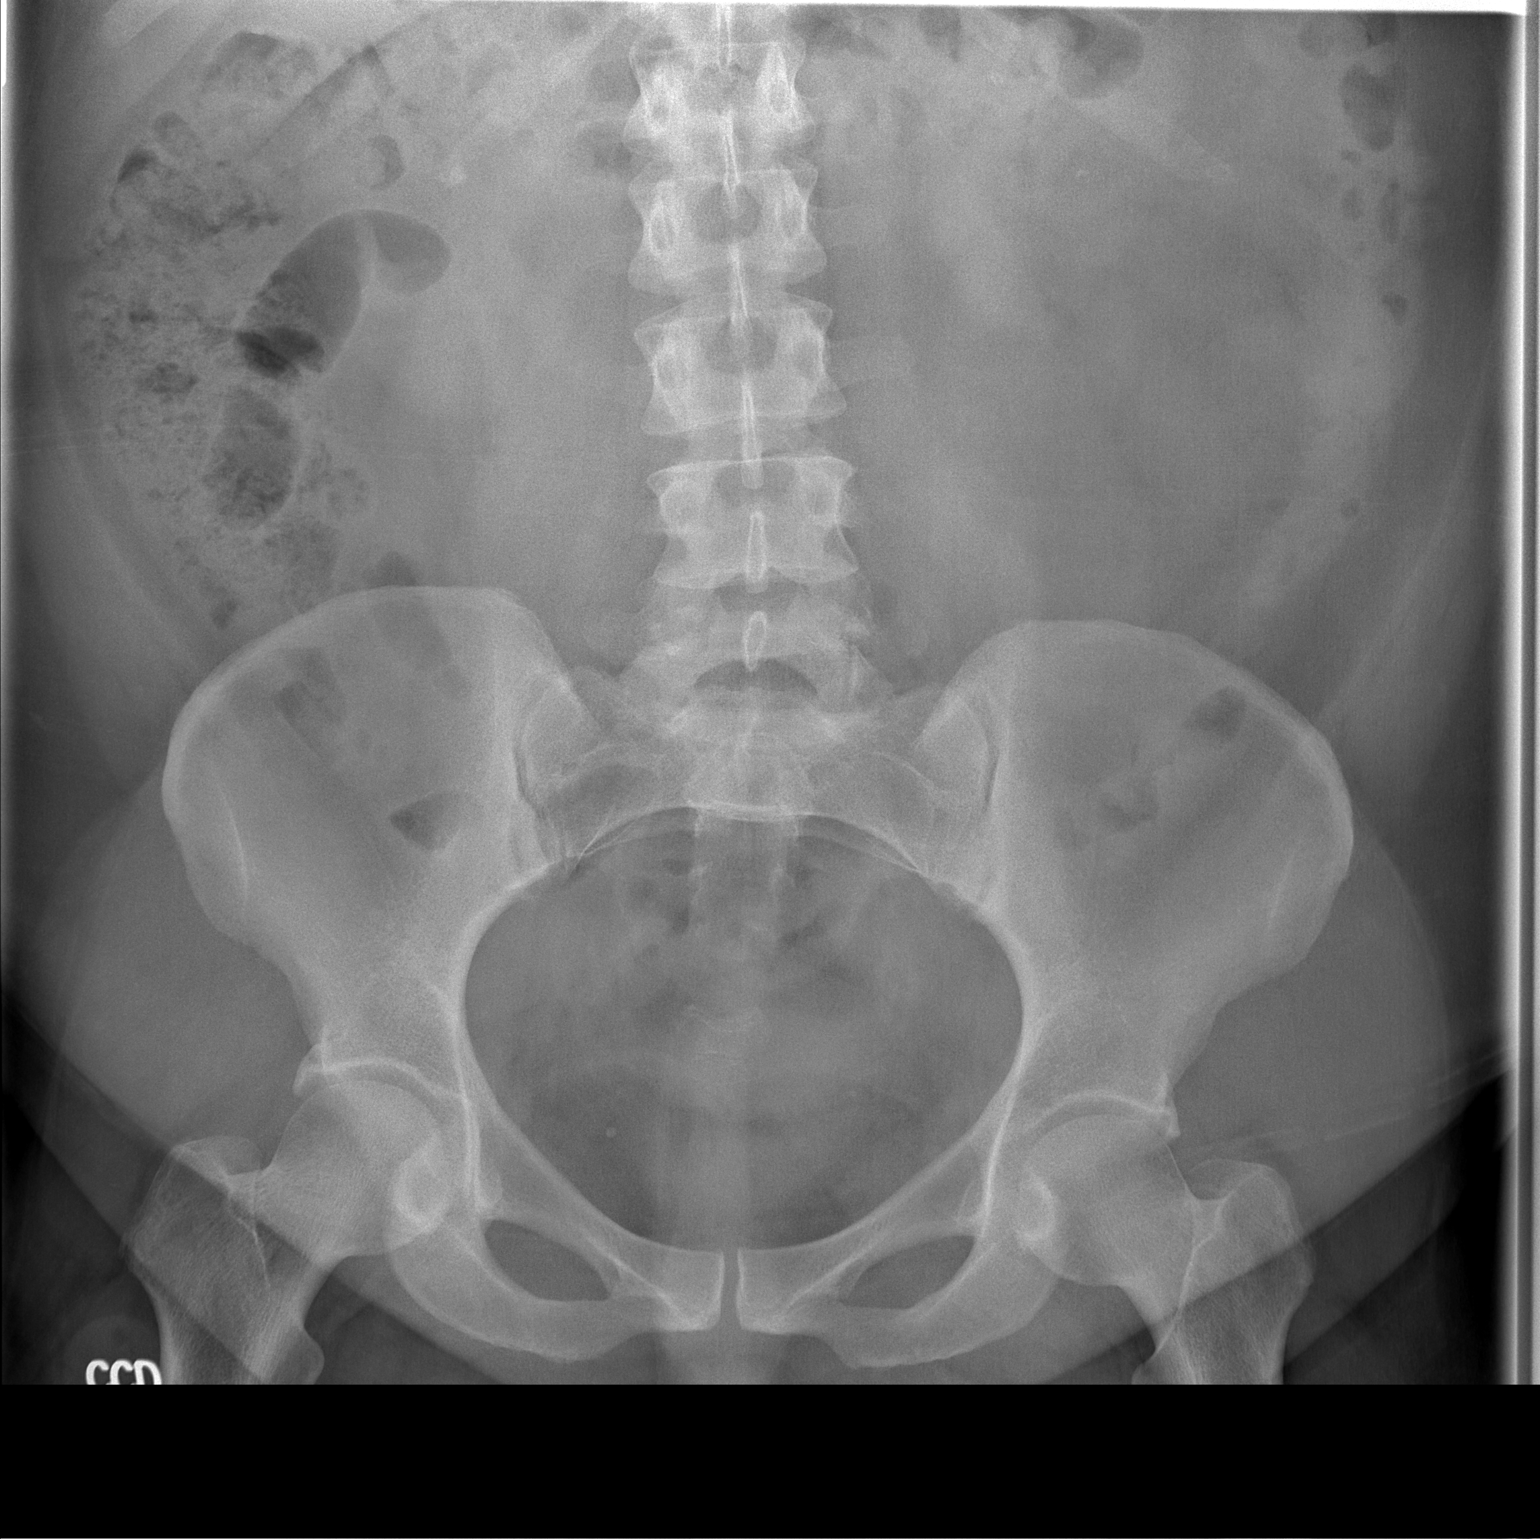

[t abdomen supine (2 of 2)]
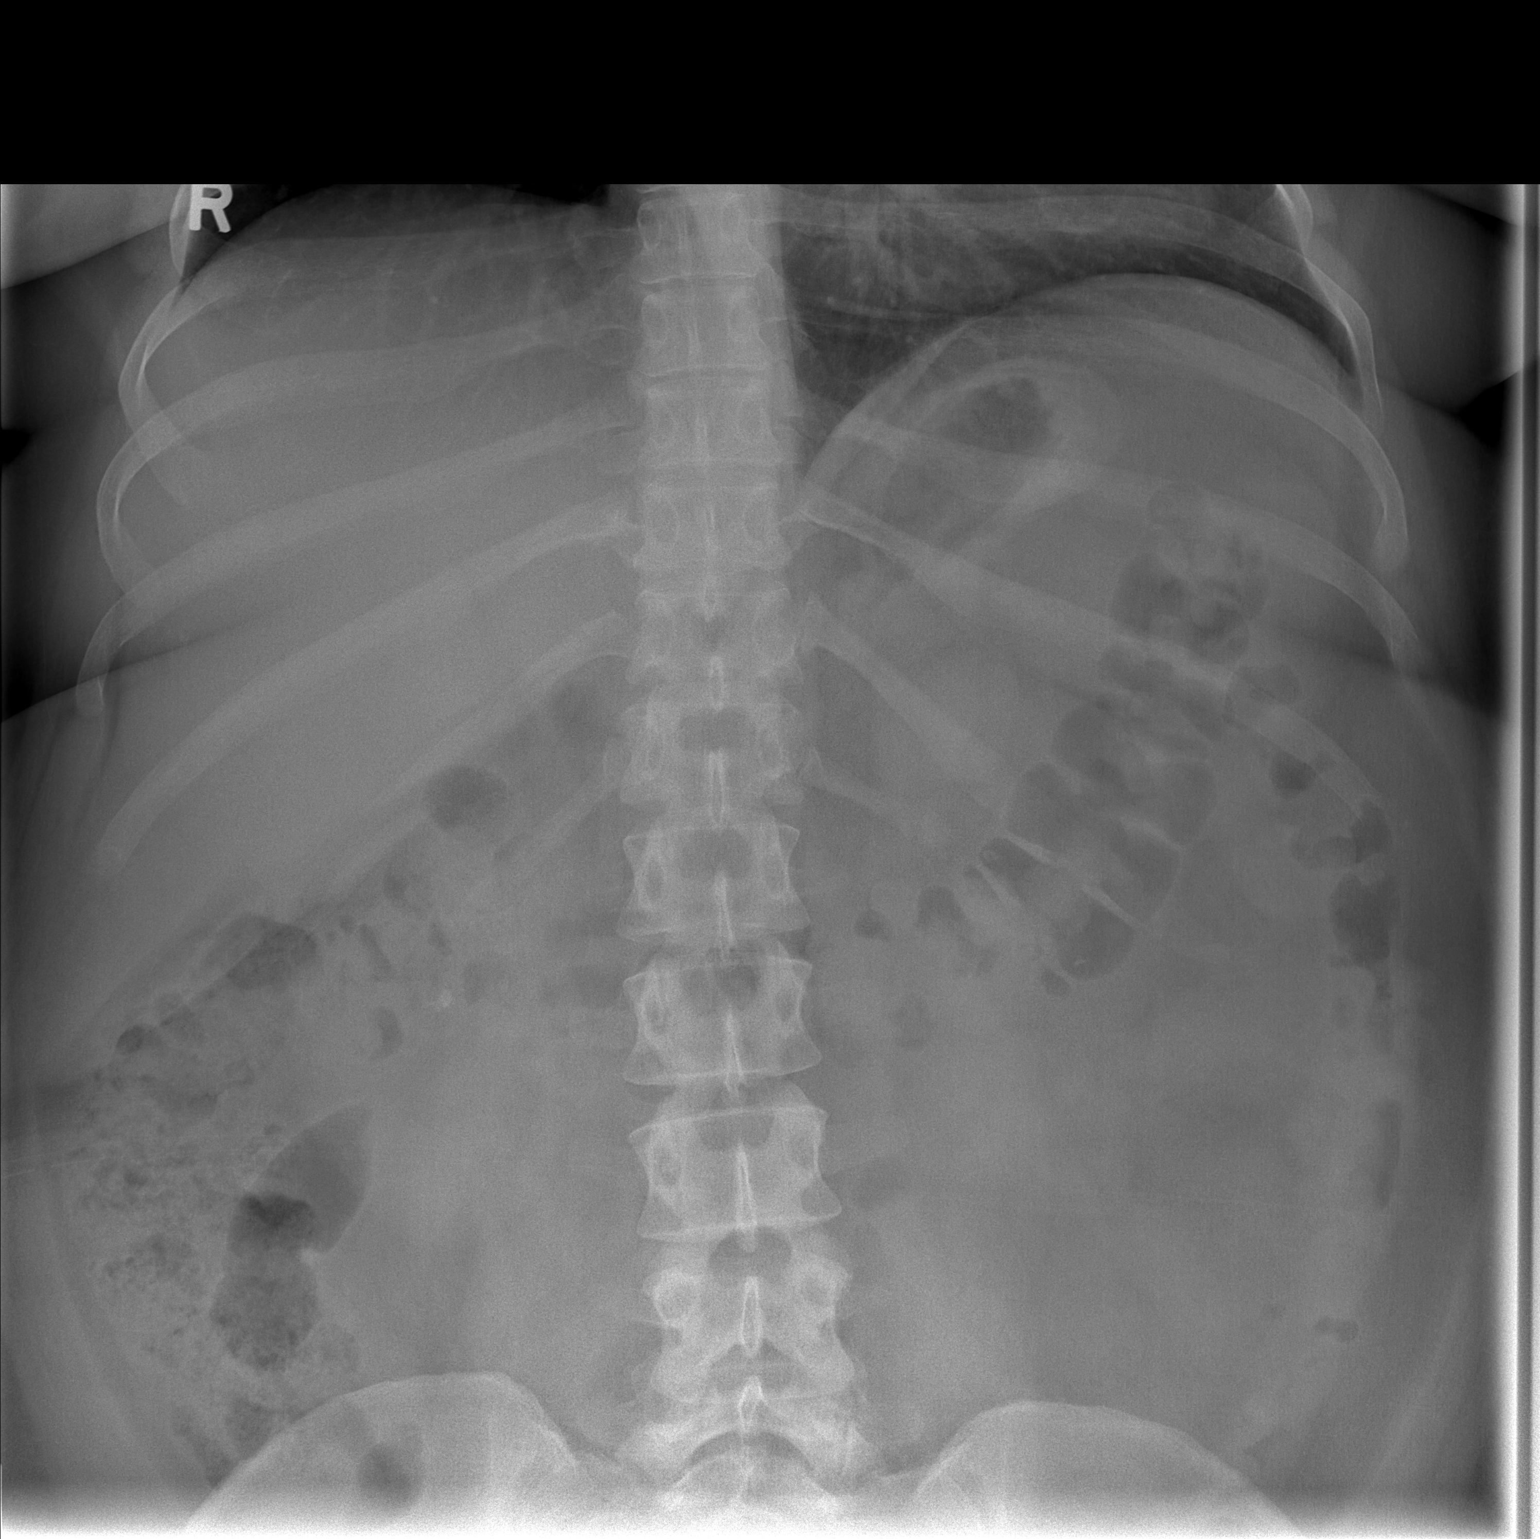

[2 of 2 positions shown; findings below may reference images not displayed]

FINDINGS: Calcification projects over the mid pole of the right
kidney, unchanged.  Tiny calcified phlebolith in the right side of
the anatomic pelvis.  Small left renal stones are also again noted.
Normal bowel gas pattern.  No bony abnormality.
IMPRESSION: Bilateral nephrolithiasis, stable.

## 2012-03-24 IMAGING — CR DG CHEST 2V
2 series · 2 of 2 positions shown · non-contrast
Comparison: None

CLINICAL DATA: Cough and fever.

CHEST - 2 VIEW

[w chest pa]
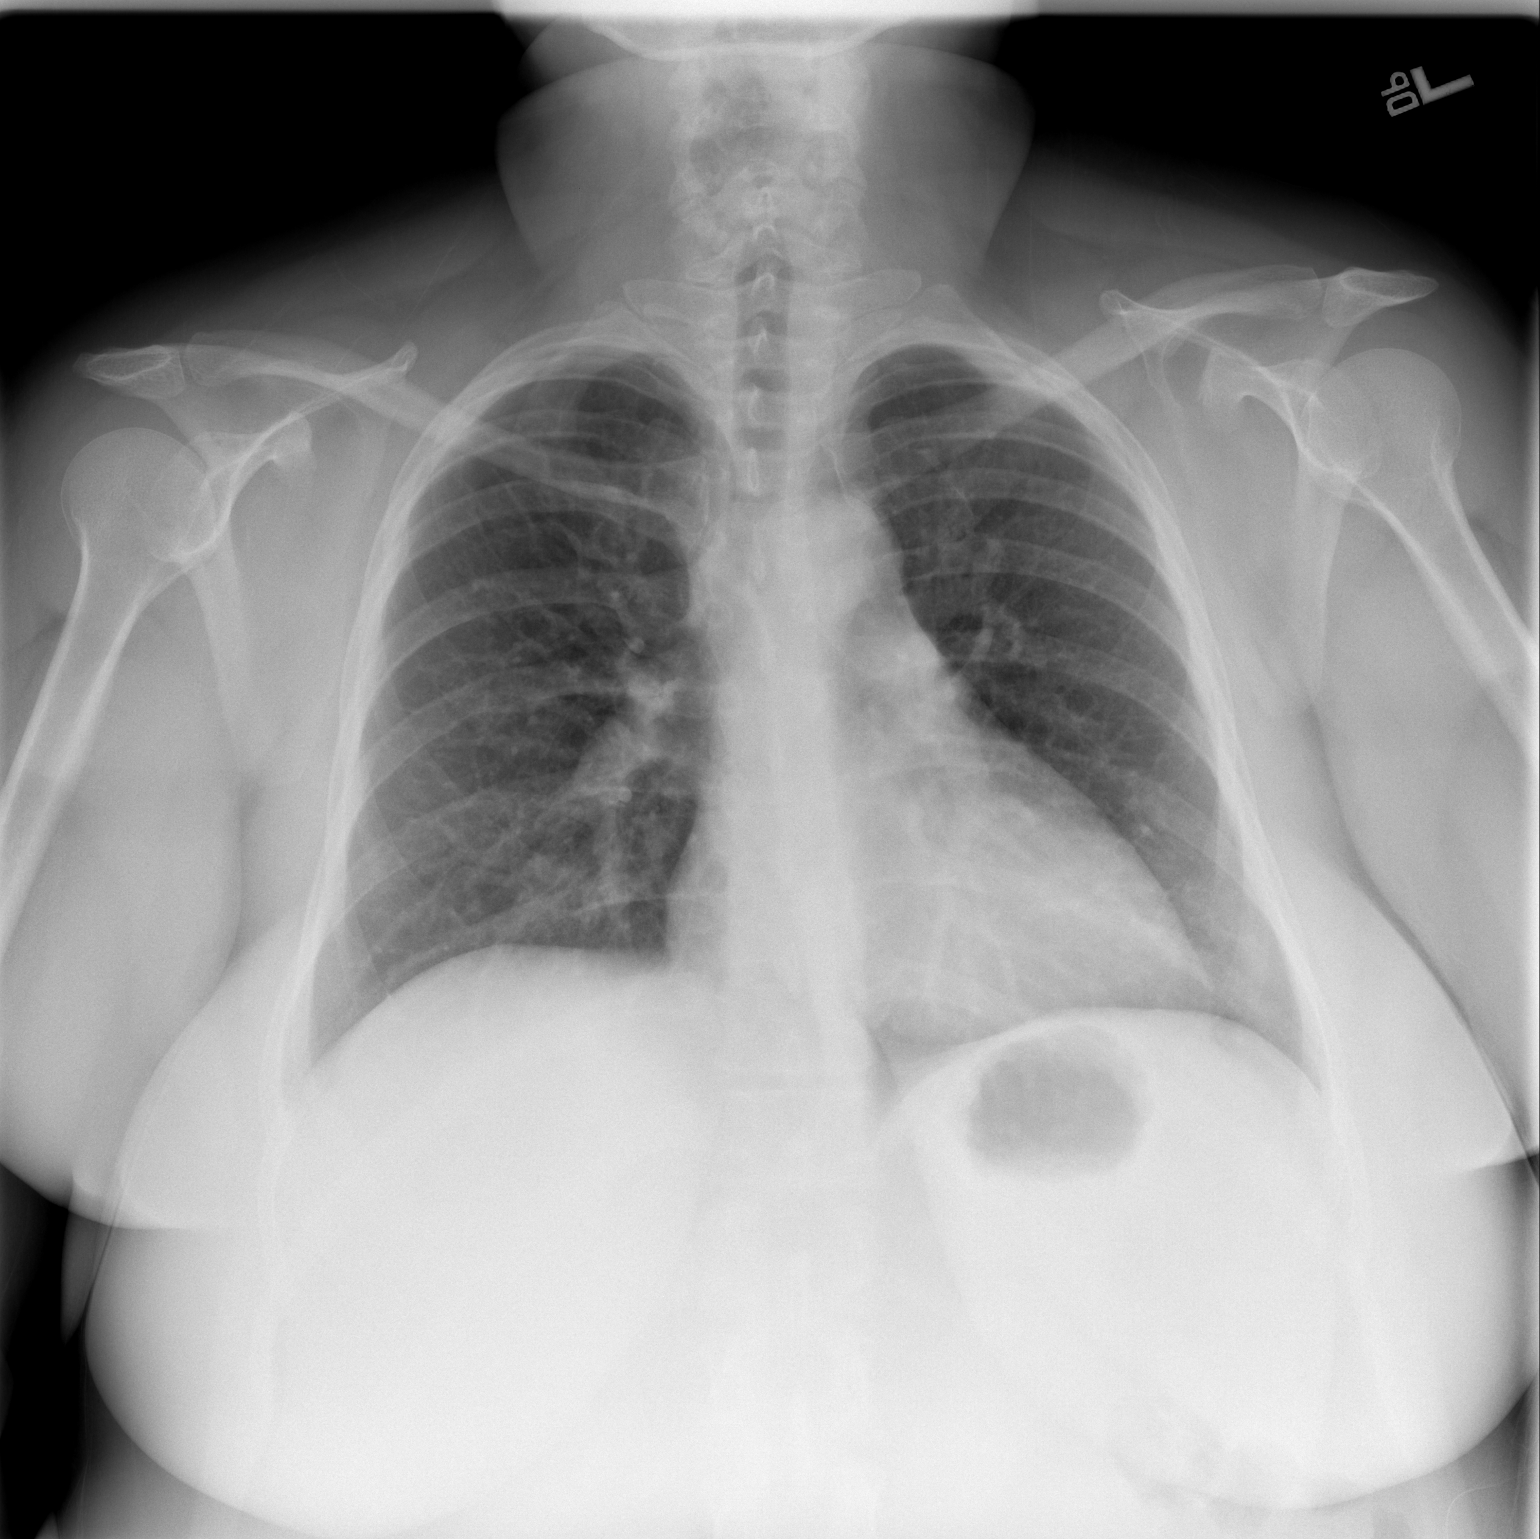

[w chest lat]
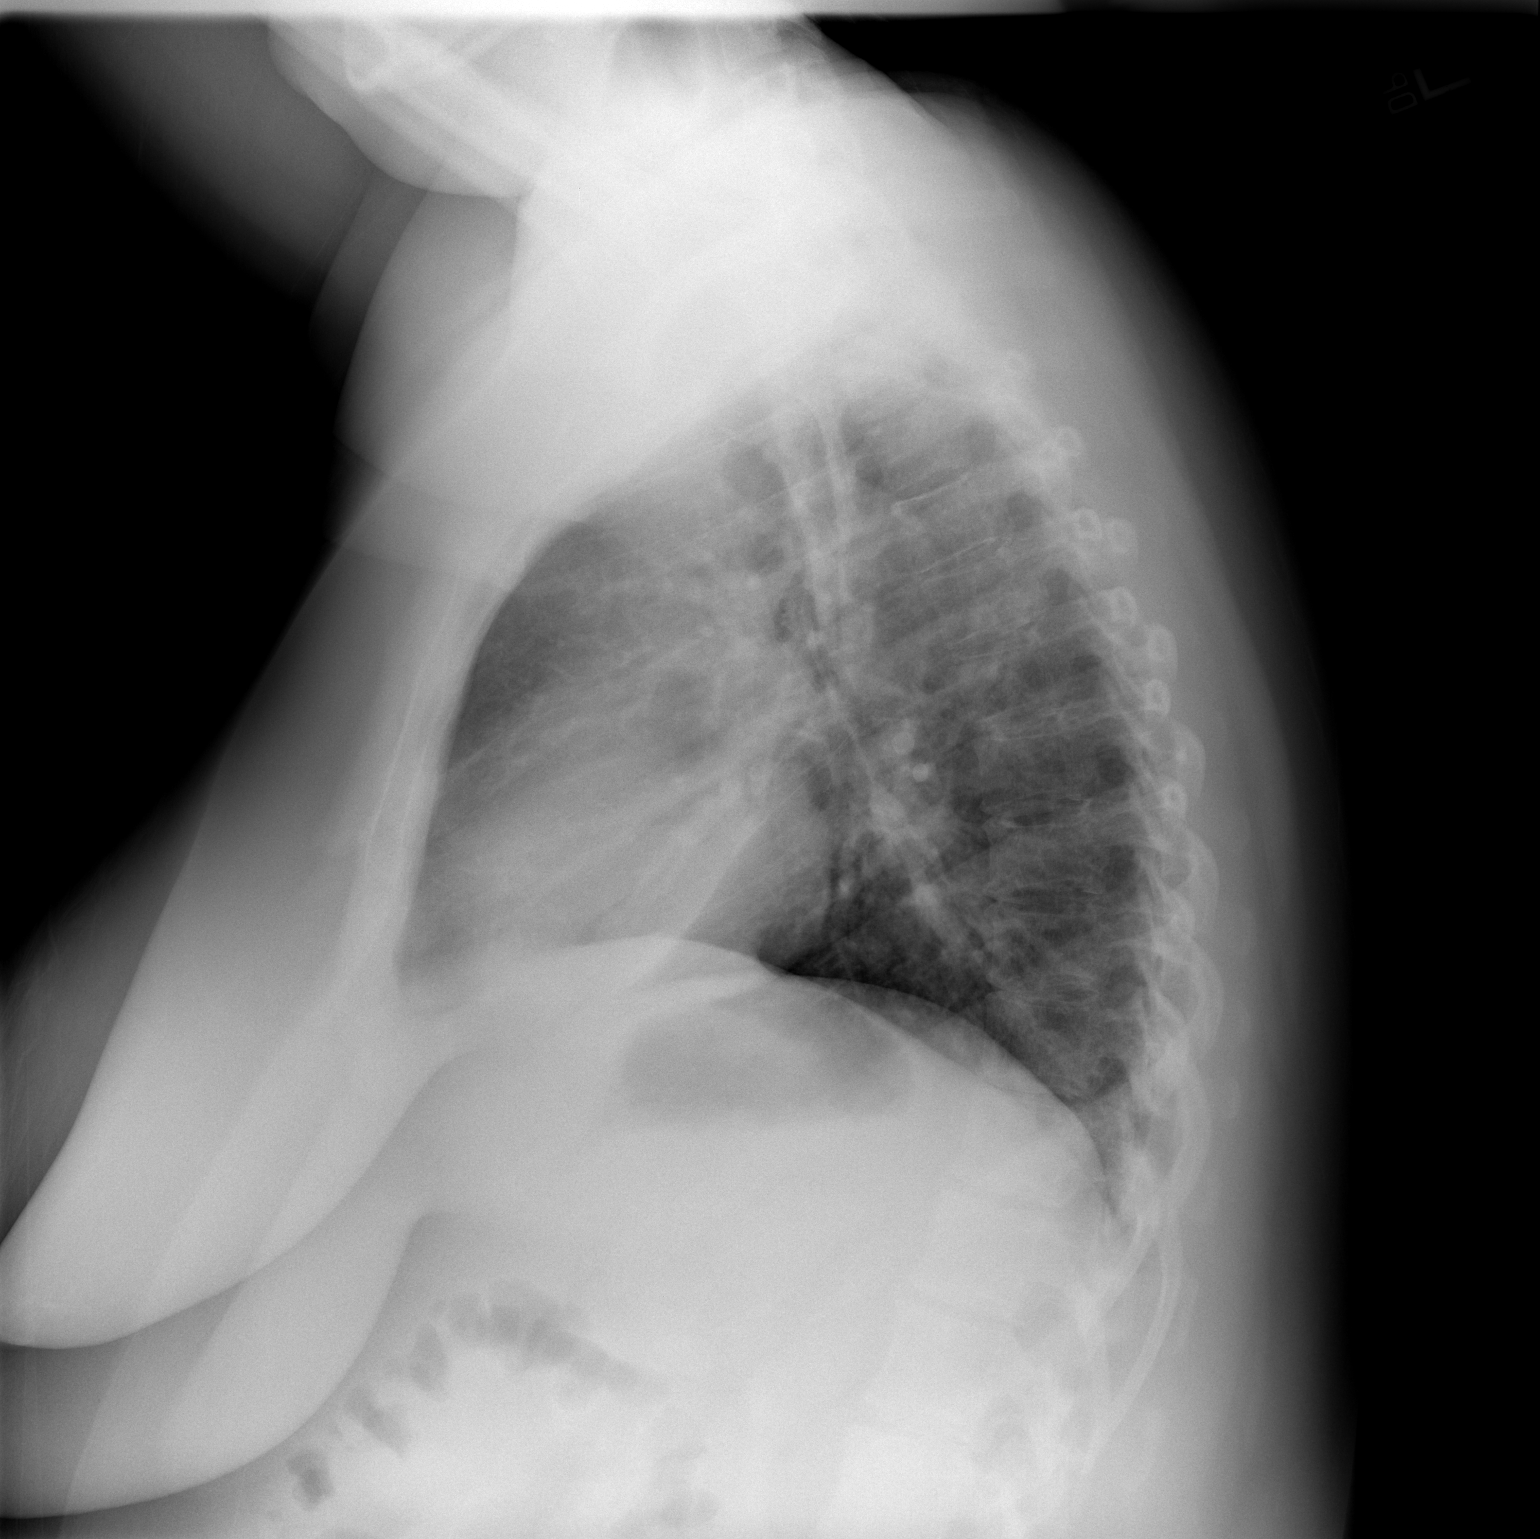

[2 of 2 positions shown; findings below may reference images not displayed]

FINDINGS: The cardiac silhouette, mediastinal and hilar contours
are within normal limits.  There are mild bronchitic type lung
changes which could be related to smoking or bronchitis.  Right
middle lobe and left lingular densities could suggest atelectasis
or early infiltrates.  No pleural effusion.  The bony thorax is
intact.
IMPRESSION: Mild bronchitic type lung changes may be related to smoking or
bronchitis.
Streaky density in the left lower lobe and right middle lobe may
suggest atelectasis or early infiltrates.

## 2012-10-24 ENCOUNTER — Ambulatory Visit: Payer: Self-pay | Admitting: Obstetrics and Gynecology

## 2012-11-08 ENCOUNTER — Ambulatory Visit: Payer: 59 | Admitting: Obstetrics and Gynecology

## 2012-11-08 ENCOUNTER — Other Ambulatory Visit: Payer: Self-pay | Admitting: Obstetrics and Gynecology

## 2012-11-08 ENCOUNTER — Encounter: Payer: Self-pay | Admitting: Obstetrics and Gynecology

## 2012-11-08 VITALS — BP 120/80 | HR 106 | Ht 60.5 in | Wt 236.0 lb

## 2012-11-08 DIAGNOSIS — Z124 Encounter for screening for malignant neoplasm of cervix: Secondary | ICD-10-CM

## 2012-11-08 DIAGNOSIS — R87619 Unspecified abnormal cytological findings in specimens from cervix uteri: Secondary | ICD-10-CM | POA: Insufficient documentation

## 2012-11-08 DIAGNOSIS — E282 Polycystic ovarian syndrome: Secondary | ICD-10-CM

## 2012-11-08 DIAGNOSIS — Z6841 Body Mass Index (BMI) 40.0 and over, adult: Secondary | ICD-10-CM | POA: Insufficient documentation

## 2012-11-08 MED ORDER — NORETHINDRONE ACET-ETHINYL EST 1-20 MG-MCG PO TABS: 1.0000 | ORAL_TABLET | Freq: Every day | ORAL | Status: DC

## 2012-11-08 NOTE — Progress Notes (Signed)
Subjective:  Last Pap: 07/29/09 WNL: Yes Regular Periods:yes Contraception: pill-microgestin  Monthly Breast exam:yes Tetanus<25yrs:yes Nl.Bladder Function:yes Daily BMs:yes Healthy Diet:yes Calcium:yes Mammogram:no Date of Mammogram: n/a Exercise:yes Have often Exercise: 1-2 times per week  Seatbelt: yes Abuse at home: no Stressful work:no Sigmoid-colonoscopy: n/a Bone Density: No PCP: Dr. Blair Heys Change in PMH: no changes Change in FMH:no changes  [name] is a 37 y.o. female, G1P1, who presents for an annual exam.     History   Social History  . Marital Status: Married    Spouse Name: N/A    Number of Children: N/A  . Years of Education: N/A   Social History Main Topics  . Smoking status: Former Smoker -- 0.2 packs/day    Types: Cigarettes    Quit date: 09/07/1998  . Smokeless tobacco: Never Used  . Alcohol Use: Yes     Comment: very occasionally  . Drug Use: No  . Sexually Active: Yes    Birth Control/ Protection: Pill     Comment: microgestin   Other Topics Concern  . None   Social History Narrative  . None    Menstrual cycle:   LMP: Patient's last menstrual period was 10/22/2012.           Cycle: every 3 mos cycles on extended cycle Micronor Dyspareunia is improved with that med.  Wants to d/c metformin . May want another pregnancy in a year or so. The following portions of the patient's history were reviewed and updated as appropriate: allergies, current medications, past family history, past medical history, past social history, past surgical history and problem list.  Review of Systems Pertinent items are noted in HPI. Breast:Negative for breast lump,nipple discharge or nipple retraction Gastrointestinal: Negative for abdominal pain, change in bowel habits or rectal bleeding Urinary:negative   Objective:    BP 120/80  Pulse 106  Ht 5' 0.5" (1.537 m)  Wt 236 lb (107.049 kg)  BMI 45.33 kg/m2  LMP 10/22/2012    Weight:  Wt  Readings from Last 1 Encounters:  11/08/12 236 lb (107.049 kg)          BMI: Body mass index is 45.33 kg/(m^2).  General Appearance: Alert, appropriate appearance for age. No acute distress HEENT: Grossly normal Neck / Thyroid: Supple, no masses, nodes or enlargement Lungs: clear to auscultation bilaterally Back: No CVA tenderness Breast Exam: No masses or nodes.No dimpling, nipple retraction or discharge. Cardiovascular: Regular rate and rhythm. S1, S2, no murmur Gastrointestinal: Soft, non-tender, no masses or organomegaly Pelvic Exam: Vulva and vagina appear normal. Bimanual exam reveals normal uterus and adnexa. Exam limited by body habitus Rectovaginal: normal rectal, no masses Lymphatic Exam: Non-palpable nodes in neck, clavicular, axillary, or inguinal regions Skin: no rash or abnormalities Neurologic: Normal gait and speech, no tremor  Psychiatric: Alert and oriented, appropriate affect.   Wet Prep:not applicable Urinalysis:not applicable UPT: Not done   Assessment:    PCOS  SX suggestive of endometriosis, now controlled on Micronor Morbid obesity   Plan:   Advised trying to reach pre-pregnancy wt of 190-200 again D/c metformin HGBa1C TODAY and again 3 mos after d/cing metformin pap smear return annually or prn STD screening: declined Contraception:oral progesterone-only contraceptive   Dierdre Forth MD

## 2012-11-09 LAB — PAP IG AND HPV HIGH-RISK

## 2012-11-16 LAB — HPV DNA, RELFEX TYPE 16/18: HPV DNA High Risk: DETECTED — AB

## 2012-11-16 LAB — HPV TYPE 16/18: HPV Genotype, 18: NOT DETECTED

## 2012-11-21 ENCOUNTER — Other Ambulatory Visit: Payer: Self-pay | Admitting: Family Medicine

## 2012-11-21 ENCOUNTER — Other Ambulatory Visit: Payer: Self-pay | Admitting: Obstetrics and Gynecology

## 2012-11-21 DIAGNOSIS — R1032 Left lower quadrant pain: Secondary | ICD-10-CM

## 2012-11-21 NOTE — Telephone Encounter (Signed)
[facility] to address. Chart is on your desk. Last AEX 11/08/12.

## 2012-11-22 ENCOUNTER — Other Ambulatory Visit: Payer: 59

## 2012-11-27 ENCOUNTER — Ambulatory Visit
Admission: RE | Admit: 2012-11-27 | Discharge: 2012-11-27 | Disposition: A | Discharge status: Home / Self Care | Payer: 59 | Source: Ambulatory Visit | Attending: Family Medicine | Admitting: Family Medicine

## 2012-11-27 DIAGNOSIS — R1032 Left lower quadrant pain: Secondary | ICD-10-CM

## 2012-11-27 MED ORDER — IOHEXOL 300 MG/ML  SOLN
125.0000 mL | Freq: Once | INTRAMUSCULAR | Status: AC | PRN
  Administered 2012-11-27: 125 mL via INTRAVENOUS

## 2012-12-12 ENCOUNTER — Ambulatory Visit: Payer: 59 | Admitting: Obstetrics and Gynecology

## 2012-12-12 ENCOUNTER — Encounter: Payer: Self-pay | Admitting: Obstetrics and Gynecology

## 2012-12-12 VITALS — BP 110/72 | Ht 60.0 in | Wt 239.0 lb

## 2012-12-12 DIAGNOSIS — Z139 Encounter for screening, unspecified: Secondary | ICD-10-CM

## 2012-12-12 DIAGNOSIS — B977 Papillomavirus as the cause of diseases classified elsewhere: Secondary | ICD-10-CM

## 2012-12-12 DIAGNOSIS — R87619 Unspecified abnormal cytological findings in specimens from cervix uteri: Secondary | ICD-10-CM

## 2012-12-12 LAB — POCT URINE PREGNANCY: Preg Test, Ur: NEGATIVE

## 2012-12-12 NOTE — Progress Notes (Signed)
GYN PROBLEM VISIT  Ms. [name] is a 37 y.o. year old female,G1P1, who presents for a problem visit.   Subjective:  Pt here for colposcopy. No complaints  Objective:  BP 110/72  Ht 5' (1.524 m)  Wt 239 lb (108.41 kg)  BMI 46.68 kg/m2  LMP 11/06/2012    Colposcopy Procedure Note  Indications: Pap smear 1 months ago showed: benign cellular changes. Positive HPV 16. The prior pap showed no abnormalities.  Prior cervical/vaginal disease: no prior exam. Prior cervical treatment: no treatment.  Procedure Details  The risks and benefits of the procedure and Written informed consent obtained.  Speculum placed in vagina and excellent visualization of cervix achieved, cervix swabbed x 3 with acetic acid solution.  Findings: Cervix: visible lesion(s) at 8 o'clock, acetowhite lesion(s) noted at 8 o'clock and mosaicism noted at 8 o'clock; SCJ visualized - lesion at 8 o'clock, endocervical curettage performed, cervical biopsies taken at 8 o'clock, specimen labelled and sent to pathology and hemostasis achieved with Monsel's solution. Vaginal inspection: vaginal colposcopy not performed. Vulvar colposcopy: vulvar colposcopy not performed.  Specimens: bx from 8    ECC   Complications: none.  Plan: Specimens labelled and sent to Pathology. Will base further treatment on Pathology findings. Treatment options discussed with patient.   Dierdre Forth, MD  12/12/2012 11:37 AM

## 2012-12-12 NOTE — Patient Instructions (Signed)

## 2012-12-21 ENCOUNTER — Encounter: Payer: Self-pay | Admitting: Obstetrics and Gynecology

## 2013-10-01 ENCOUNTER — Emergency Department (HOSPITAL_COMMUNITY)
Admission: EM | Admit: 2013-10-01 | Discharge: 2013-10-01 | Disposition: A | Discharge status: Home / Self Care | Payer: 59 | Attending: Emergency Medicine | Admitting: Emergency Medicine

## 2013-10-01 ENCOUNTER — Encounter (HOSPITAL_COMMUNITY): Payer: Self-pay | Admitting: Emergency Medicine

## 2013-10-01 ENCOUNTER — Emergency Department (HOSPITAL_COMMUNITY): Payer: 59

## 2013-10-01 DIAGNOSIS — F411 Generalized anxiety disorder: Secondary | ICD-10-CM | POA: Insufficient documentation

## 2013-10-01 DIAGNOSIS — N200 Calculus of kidney: Secondary | ICD-10-CM

## 2013-10-01 DIAGNOSIS — M549 Dorsalgia, unspecified: Secondary | ICD-10-CM | POA: Insufficient documentation

## 2013-10-01 DIAGNOSIS — N189 Chronic kidney disease, unspecified: Secondary | ICD-10-CM | POA: Insufficient documentation

## 2013-10-01 DIAGNOSIS — Z79899 Other long term (current) drug therapy: Secondary | ICD-10-CM | POA: Insufficient documentation

## 2013-10-01 DIAGNOSIS — Z3202 Encounter for pregnancy test, result negative: Secondary | ICD-10-CM | POA: Insufficient documentation

## 2013-10-01 DIAGNOSIS — Z8742 Personal history of other diseases of the female genital tract: Secondary | ICD-10-CM | POA: Insufficient documentation

## 2013-10-01 DIAGNOSIS — Z87891 Personal history of nicotine dependence: Secondary | ICD-10-CM | POA: Insufficient documentation

## 2013-10-01 LAB — CBC WITH DIFFERENTIAL/PLATELET
Basophils Absolute: 0 10*3/uL (ref 0.0–0.1)
Basophils Relative: 0 % (ref 0–1)
Lymphocytes Relative: 29 % (ref 12–46)
MCHC: 35 g/dL (ref 30.0–36.0)
Monocytes Absolute: 0.7 10*3/uL (ref 0.1–1.0)
Neutro Abs: 7.7 10*3/uL (ref 1.7–7.7)
Neutrophils Relative %: 64 % (ref 43–77)
Platelets: 410 10*3/uL — ABNORMAL HIGH (ref 150–400)
RDW: 13.9 % (ref 11.5–15.5)
WBC: 12 10*3/uL — ABNORMAL HIGH (ref 4.0–10.5)

## 2013-10-01 LAB — COMPREHENSIVE METABOLIC PANEL
ALT: 20 U/L (ref 0–35)
AST: 27 U/L (ref 0–37)
Albumin: 3.8 g/dL (ref 3.5–5.2)
CO2: 22 mEq/L (ref 19–32)
Chloride: 98 mEq/L (ref 96–112)
Creatinine, Ser: 0.69 mg/dL (ref 0.50–1.10)
Potassium: 3.4 mEq/L — ABNORMAL LOW (ref 3.5–5.1)
Sodium: 137 mEq/L (ref 135–145)
Total Bilirubin: 0.3 mg/dL (ref 0.3–1.2)

## 2013-10-01 LAB — URINALYSIS, ROUTINE W REFLEX MICROSCOPIC
Leukocytes, UA: NEGATIVE
Nitrite: NEGATIVE
Protein, ur: 30 mg/dL — AB
Specific Gravity, Urine: 1.017 (ref 1.005–1.030)
Urobilinogen, UA: 0.2 mg/dL (ref 0.0–1.0)

## 2013-10-01 LAB — URINE MICROSCOPIC-ADD ON

## 2013-10-01 LAB — PREGNANCY, URINE: Preg Test, Ur: NEGATIVE

## 2013-10-01 MED ORDER — HYDROMORPHONE HCL PF 1 MG/ML IJ SOLN
1.0000 mg | Freq: Once | INTRAMUSCULAR | Status: AC
  Administered 2013-10-01: 1 mg via INTRAVENOUS
  Filled 2013-10-01: qty 1

## 2013-10-01 MED ORDER — OXYCODONE-ACETAMINOPHEN 5-325 MG PO TABS: 1.0000 | ORAL_TABLET | ORAL | Status: DC | PRN

## 2013-10-01 MED ORDER — TAMSULOSIN HCL 0.4 MG PO CAPS: 0.4000 mg | ORAL_CAPSULE | Freq: Every day | ORAL | Status: DC

## 2013-10-01 MED ORDER — KETOROLAC TROMETHAMINE 30 MG/ML IJ SOLN
30.0000 mg | Freq: Once | INTRAMUSCULAR | Status: AC
  Administered 2013-10-01: 30 mg via INTRAVENOUS
  Filled 2013-10-01: qty 1

## 2013-10-01 NOTE — ED Notes (Signed)
Patient c/o left flank pain. Patient reports history of kidney stones and states she was diagnosed with stones in the past year. Patient denies any blood in her urine.

## 2013-10-01 NOTE — ED Provider Notes (Signed)
Medical screening examination/treatment/procedure(s) were performed by non-physician practitioner and as supervising physician I was immediately available for consultation/collaboration.  EKG Interpretation   None        Doug Sou, MD 10/01/13 2330

## 2013-10-01 NOTE — ED Provider Notes (Signed)
CSN: [id number]     Arrival date & time 10/01/13  1508 History   None    Chief Complaint  Patient presents with  . Flank Pain    HPI  [name] is a 37 y.o. female with a PMH of PCOS, anxiety, CKD, oligomenorrhea, insulin resistance, abnormal pap, and kidney stones who presents to the ED for evaluation of flank pain.  History was provided by the patient.  Patient states that she has had right flank pain off and on for the past few days.  Her pain intensified today about an hour prior to arrival.  She describes a dull constant pain currently.  Her pain is located in her left lower back with radiation to her left flank, LLQ, and left groin.  Her pain is similar to her kidney stones in the past.  Nothing makes her pain better/worse.  She has urologist at [facility] Dr. Margarita Grizzle.  She has a hx of lithotripsy in the past.  She has not taken anything for pain prior to arrival.  No nausea, emesis, diarrhea, constipation, dysuria/hematuria, or vaginal bleeding/discharge.  Previous abdominal surgeries include a c-section.  She otherwise has been well with no fevers, chills, or change in appetite/activity.  No chest pain, SOB, dizziness, lightheadedness, or headaches.       Past Medical History  Diagnosis Date  . PCOS (polycystic ovarian syndrome)   . Anxiety     takes Zoloft  . Chronic kidney disease     right renal stone  . Oligomenorrhea   . Insulin resistance   . Abnormal pap 06/20/07    ASCUS  . Kidney stones    Past Surgical History  Procedure Laterality Date  . Cesarean section     History reviewed. No pertinent family history. History  Substance Use Topics  . Smoking status: Former Smoker -- 0.25 packs/day    Types: Cigarettes    Quit date: 09/07/1998  . Smokeless tobacco: Never Used  . Alcohol Use: Yes     Comment: very occasionally   OB History   Grav Para Term Preterm Abortions TAB SAB Ect Mult Living   1 1        1      Review of Systems  Constitutional: Negative for  fever, chills, diaphoresis, activity change, appetite change and fatigue.  HENT: Negative for congestion and sore throat.   Respiratory: Negative for cough and shortness of breath.   Cardiovascular: Negative for chest pain and leg swelling.  Gastrointestinal: Positive for abdominal pain. Negative for nausea, vomiting, diarrhea, constipation, anal bleeding and rectal pain.  Genitourinary: Positive for flank pain. Negative for dysuria, urgency, hematuria, decreased urine volume, vaginal bleeding, vaginal discharge, difficulty urinating, genital sores, vaginal pain, menstrual problem and pelvic pain.  Musculoskeletal: Positive for back pain. Negative for gait problem, myalgias and neck pain.  Skin: Negative for color change and wound.  Neurological: Negative for dizziness, weakness, light-headedness, numbness and headaches.    Allergies  Review of patient's allergies indicates no known allergies.  Home Medications   Current Outpatient Rx  Name  Route  Sig  Dispense  Refill  . Calcium-Phosphorus-Vitamin D (CALCIUM GUMMIES PO)   Oral   Take 1 tablet by mouth daily.         . metFORMIN (GLUCOPHAGE-XR) 750 MG 24 hr tablet      TAKE 2 TABLETS DAILY   180 tablet   2   . metFORMIN (GLUMETZA) 1000 MG (MOD) 24 hr tablet   Oral  Take 1,000 mg by mouth daily with breakfast.         . Multiple Vitamins-Minerals (MULTIVITAMIN WITH MINERALS) tablet   Oral   Take 1 tablet by mouth daily.           . norethindrone-ethinyl estradiol (MICROGESTIN) 1-20 MG-MCG tablet   Oral   Take 1 tablet by mouth daily.   3 Package   3   . Potassium Citrate (UROCIT-K 15 PO)   Oral   Take by mouth 2 (two) times daily.         . sertraline (ZOLOFT) 100 MG tablet   Oral   Take 150 mg by mouth daily.           . traZODone (DESYREL) 150 MG tablet   Oral   Take 150 mg by mouth at bedtime.            BP 149/99  Pulse 116  Temp(Src) 97.6 F (36.4 C) (Oral)  Resp 16  Ht 5' (1.524 m)  Wt  225 lb (102.059 kg)  BMI 43.94 kg/m2  SpO2 98%  LMP 08/01/2013  Filed Vitals:   10/01/13 1514 10/01/13 1757 10/01/13 1936  BP: 149/99 141/72 145/69  Pulse: 116 97 87  Temp: 97.6 F (36.4 C) 98.4 F (36.9 C) 98.5 F (36.9 C)  TempSrc: Oral Oral Oral  Resp: 16 20 18   Height: 5' (1.524 m)    Weight: 225 lb (102.059 kg)    SpO2: 98% 95% 95%    Physical Exam  Nursing note and vitals reviewed. Constitutional: She is oriented to person, place, and time. She appears well-developed and well-nourished. No distress.  Patient appears to be in pain and uncomfortable  HENT:  Head: Normocephalic and atraumatic.  Right Ear: External ear normal.  Left Ear: External ear normal.  Nose: Nose normal.  Mouth/Throat: Oropharynx is clear and moist.  Eyes: Conjunctivae are normal. Pupils are equal, round, and reactive to light. Right eye exhibits no discharge. Left eye exhibits no discharge.  Neck: Normal range of motion. Neck supple.  Cardiovascular: Normal rate, regular rhythm, normal heart sounds and intact distal pulses.  Exam reveals no gallop and no friction rub.   No murmur heard. Pulmonary/Chest: Effort normal and breath sounds normal. No respiratory distress. She has no wheezes. She has no rales. She exhibits no tenderness.  Abdominal: Soft. Bowel sounds are normal. She exhibits no distension and no mass. There is no tenderness. There is no rebound and no guarding.  Musculoskeletal: Normal range of motion. She exhibits tenderness. She exhibits no edema.  Tenderness to palpation to the left flank.  No CVA tenderness bilaterally.  No tenderness to palpation to the thoracic or lumbar spinous processes throughout.  No tenderness to palpation to the paraspinal muscles throughout.  Patient able to ambulate without difficulty or ataxia.    Neurological: She is alert and oriented to person, place, and time.  Skin: Skin is warm and dry. She is not diaphoretic.    ED Course  Procedures (including  critical care time) Labs Review Labs Reviewed  URINALYSIS, ROUTINE W REFLEX MICROSCOPIC   Imaging Review No results found.  EKG Interpretation   None      Results for orders placed during the hospital encounter of 10/01/13  URINALYSIS, ROUTINE W REFLEX MICROSCOPIC      Result Value Range   Color, Urine YELLOW  YELLOW   APPearance CLOUDY (*) CLEAR   Specific Gravity, Urine 1.017  1.005 - 1.030   pH 6.0  5.0 - 8.0   Glucose, UA NEGATIVE  NEGATIVE mg/dL   Hgb urine dipstick TRACE (*) NEGATIVE   Bilirubin Urine NEGATIVE  NEGATIVE   Ketones, ur NEGATIVE  NEGATIVE mg/dL   Protein, ur 30 (*) NEGATIVE mg/dL   Urobilinogen, UA 0.2  0.0 - 1.0 mg/dL   Nitrite NEGATIVE  NEGATIVE   Leukocytes, UA NEGATIVE  NEGATIVE  PREGNANCY, URINE      Result Value Range   Preg Test, Ur NEGATIVE  NEGATIVE  CBC WITH DIFFERENTIAL      Result Value Range   WBC 12.0 (*) 4.0 - 10.5 K/uL   RBC 4.68  3.87 - 5.11 MIL/uL   Hemoglobin 13.5  12.0 - 15.0 g/dL   HCT 40.9  81.1 - 91.4 %   MCV 82.5  78.0 - 100.0 fL   MCH 28.8  26.0 - 34.0 pg   MCHC 35.0  30.0 - 36.0 g/dL   RDW 78.2  95.6 - 21.3 %   Platelets 410 (*) 150 - 400 K/uL   Neutrophils Relative % 64  43 - 77 %   Neutro Abs 7.7  1.7 - 7.7 K/uL   Lymphocytes Relative 29  12 - 46 %   Lymphs Abs 3.4  0.7 - 4.0 K/uL   Monocytes Relative 5  3 - 12 %   Monocytes Absolute 0.7  0.1 - 1.0 K/uL   Eosinophils Relative 2  0 - 5 %   Eosinophils Absolute 0.2  0.0 - 0.7 K/uL   Basophils Relative 0  0 - 1 %   Basophils Absolute 0.0  0.0 - 0.1 K/uL  COMPREHENSIVE METABOLIC PANEL      Result Value Range   Sodium 137  135 - 145 mEq/L   Potassium 3.4 (*) 3.5 - 5.1 mEq/L   Chloride 98  96 - 112 mEq/L   CO2 22  19 - 32 mEq/L   Glucose, Bld 139 (*) 70 - 99 mg/dL   BUN 9  6 - 23 mg/dL   Creatinine, Ser 0.86  0.50 - 1.10 mg/dL   Calcium 9.8  8.4 - 57.8 mg/dL   Total Protein 7.8  6.0 - 8.3 g/dL   Albumin 3.8  3.5 - 5.2 g/dL   AST 27  0 - 37 U/L   ALT 20  0 - 35  U/L   Alkaline Phosphatase 61  39 - 117 U/L   Total Bilirubin 0.3  0.3 - 1.2 mg/dL   GFR calc non Af Amer >90  >90 mL/min   GFR calc Af Amer >90  >90 mL/min  URINE MICROSCOPIC-ADD ON      Result Value Range   Squamous Epithelial / LPF RARE  RARE   WBC, UA 0-2  <3 WBC/hpf   RBC / HPF 0-2  <3 RBC/hpf   Bacteria, UA RARE  RARE   Casts HYALINE CASTS (*) NEGATIVE   Urine-Other MUCOUS PRESENT      MDM   SHAUNDREA CARRIGG is a 37 y.o. female with a PMH of PCOS, anxiety, CKD, oligomenorrhea, insulin resistance, abnormal pap, and kidney stones who presents to the ED for evaluation of flank pain.   Rechecks  5:30 PM = Pain well controlled.  Patient appears more comfortable.    6:45 PM = Pain returning.  Toradol ordered.   7:30 PM = Pain well controlled.  Patient feels ready for discharge.      Etiology of flank pain likely due to a kidney stone.  CT revealed a  5 mm mildly obstructing left distal UVJ calculus with mild hydronephrosis and hydroureter.  Patient's pain controlled at discharge.  Patient had no complaints of abdominal pain and her abdominal exam was benign. She was given prescriptions for percocet and Flomax.  She has a strainer at home.  Patient was instructed to follow-up with urologist.  Urine negative for UTI.  Labs otherwise unremarkable.  Return precautions, discharge instructions, and follow-up was discussed with the patient.  Patient in agreement with discharge and plan.  Husband in ED and will drive patient home.        Discharge Medication List as of 10/01/2013  7:30 PM    START taking these medications   Details  oxyCODONE-acetaminophen (PERCOCET/ROXICET) 5-325 MG per tablet Take 1-2 tablets by mouth every 4 (four) hours as needed for severe pain., Starting 10/01/2013, Until Discontinued, Print    tamsulosin (FLOMAX) 0.4 MG CAPS capsule Take 1 capsule (0.4 mg total) by mouth daily., Starting 10/01/2013, Until Discontinued, Print         Final impressions: 1. Kidney  stone on left side       Luiz Iron PA-C   This patient was discussed with Dr. Barbarann Ehlers, PA-C 10/01/13 2324

## 2014-05-21 ENCOUNTER — Emergency Department (HOSPITAL_COMMUNITY)
Admission: EM | Admit: 2014-05-21 | Discharge: 2014-05-21 | Discharge status: Against Medical Advice | Payer: 59 | Attending: Emergency Medicine | Admitting: Emergency Medicine

## 2014-05-21 ENCOUNTER — Encounter (HOSPITAL_COMMUNITY): Payer: Self-pay | Admitting: Emergency Medicine

## 2014-05-21 DIAGNOSIS — N189 Chronic kidney disease, unspecified: Secondary | ICD-10-CM | POA: Diagnosis not present

## 2014-05-21 DIAGNOSIS — R1110 Vomiting, unspecified: Secondary | ICD-10-CM | POA: Diagnosis not present

## 2014-05-21 DIAGNOSIS — R51 Headache: Secondary | ICD-10-CM | POA: Diagnosis not present

## 2014-05-21 DIAGNOSIS — Z87442 Personal history of urinary calculi: Secondary | ICD-10-CM | POA: Diagnosis not present

## 2014-05-21 NOTE — ED Notes (Signed)
Pt c/o headache x 1 day and vomiting about 1 hour ago.

## 2014-05-21 NOTE — ED Notes (Signed)
Patient called for the second time, with no response

## 2014-05-21 NOTE — ED Notes (Signed)
Patient called 3rd time for vital sign check, no answer. Patient dismissed at this time LWBS

## 2014-05-21 NOTE — ED Notes (Signed)
Attempted to call patient x1- no response

## 2014-05-21 NOTE — ED Notes (Signed)
Patient called to recheck vitals. No answer.

## 2014-08-07 ENCOUNTER — Other Ambulatory Visit: Payer: Self-pay | Admitting: Orthopedic Surgery

## 2014-08-15 ENCOUNTER — Ambulatory Visit (HOSPITAL_BASED_OUTPATIENT_CLINIC_OR_DEPARTMENT_OTHER): Admit: 2014-08-15 | Payer: 59 | Admitting: Orthopedic Surgery

## 2014-08-15 ENCOUNTER — Encounter (HOSPITAL_BASED_OUTPATIENT_CLINIC_OR_DEPARTMENT_OTHER): Payer: Self-pay

## 2014-08-15 SURGERY — CARPAL TUNNEL RELEASE
Anesthesia: Choice | Laterality: Right

## 2014-08-26 ENCOUNTER — Encounter (HOSPITAL_COMMUNITY): Payer: Self-pay | Admitting: Emergency Medicine

## 2015-02-25 ENCOUNTER — Encounter (HOSPITAL_COMMUNITY): Payer: Self-pay | Admitting: General Practice

## 2015-02-26 ENCOUNTER — Other Ambulatory Visit: Payer: Self-pay | Admitting: Obstetrics and Gynecology

## 2015-03-16 ENCOUNTER — Encounter (HOSPITAL_COMMUNITY): Payer: Self-pay | Admitting: Obstetrics and Gynecology

## 2015-03-16 DIAGNOSIS — IMO0002 Reserved for concepts with insufficient information to code with codable children: Secondary | ICD-10-CM | POA: Diagnosis present

## 2015-03-16 NOTE — Anesthesia Preprocedure Evaluation (Addendum)
Anesthesia Evaluation  Patient identified by MRN, date of birth, ID band Patient awake    Reviewed: Allergy & Precautions, NPO status , Patient's Chart, lab work & pertinent test results  History of Anesthesia Complications Negative for: history of anesthetic complications  Airway Mallampati: III  TM Distance: >3 FB Neck ROM: Full    Dental no notable dental hx. (+) Dental Advisory Given   Pulmonary former smoker,  breath sounds clear to auscultation  Pulmonary exam normal       Cardiovascular negative cardio ROS Normal cardiovascular examRhythm:Regular Rate:Normal     Neuro/Psych PSYCHIATRIC DISORDERS Anxiety negative neurological ROS     GI/Hepatic negative GI ROS, Neg liver ROS,   Endo/Other  Morbid obesity  Renal/GU negative Renal ROS  negative genitourinary   Musculoskeletal negative musculoskeletal ROS (+)   Abdominal   Peds negative pediatric ROS (+)  Hematology negative hematology ROS (+)   Anesthesia Other Findings   Reproductive/Obstetrics negative OB ROS                            Anesthesia Physical Anesthesia Plan  ASA: III  Anesthesia Plan: General   Post-op Pain Management:    Induction: Intravenous  Airway Management Planned: LMA  Additional Equipment:   Intra-op Plan:   Post-operative Plan: Extubation in OR  Informed Consent: I have reviewed the patients History and Physical, chart, labs and discussed the procedure including the risks, benefits and alternatives for the proposed anesthesia with the patient or authorized representative who has indicated his/her understanding and acceptance.   Dental advisory given  Plan Discussed with: CRNA  Anesthesia Plan Comments:         Anesthesia Quick Evaluation

## 2015-03-16 NOTE — H&P (Signed)
[name] is an 39 y.o. female. Who presents for LEEP excision of HGSIL of the cervix.  She had her first abnormal pap in 2008. Evaluations had all shown only LGSIL until 11/25/14 when pap showed ASCUS with positive HR HPV 16.  Colposcopically directed biopsies showed HGSIL, moderated dysplasia and ECC neg.  The patient was noted to have an elevation in her BP at her pre-op visit and I changed her BCPs to progesterone only pill.  She has been on that pill for 1 week.  Pertinent Gynecological History: Menses: Governed by bcps, currently monthly with progesterone only pills. Contraception: oral progesterone-only contraceptive Blood transfusions: none Sexually transmitted diseases: no past history Previous GYN Procedures: none  Last mammogram: n/a Last pap: abnormal: ASCUS with positive HR HPV Date: 11/25/14 OB History: G1, P1   Menstrual History: Menarche age: 7 Patient's last menstrual period was 02/16/2015 (approximate).    Past Medical History  Diagnosis Date  . PCOS (polycystic ovarian syndrome)   . Anxiety     takes Zoloft  . Chronic kidney disease     right renal stone  . Oligomenorrhea   . Insulin resistance   . Abnormal pap 06/20/07    ASCUS  . Kidney stones   . Vaginal Pap smear, abnormal   . Anxiety disorder     Past Surgical History  Procedure Laterality Date  . Cesarean section    . Wisdom tooth extraction Bilateral   . Lithotripsy      2013    History reviewed. No pertinent family history.  Social History:  reports that she quit smoking about 16 years ago. Her smoking use included Cigarettes. She smoked 0.25 packs per day. She has never used smokeless tobacco. She reports that she drinks alcohol. She reports that she does not use illicit drugs.  Allergies: No Known Allergies  Prescriptions prior to admission  Medication Sig Dispense Refill Last Dose  . acetaminophen (TYLENOL) 500 MG tablet Take 1,000 mg by mouth every 6 (six) hours as needed for mild  pain or headache.     Marland Kitchen BIOTIN PO Take 2,500 mg by mouth at bedtime.     Marland Kitchen HYDROcodone-acetaminophen (NORCO/VICODIN) 5-325 MG per tablet Take 2 tablets by mouth every 6 (six) hours as needed for moderate pain.     Marland Kitchen sertraline (ZOLOFT) 100 MG tablet Take 150 mg by mouth daily.    03/16/2015 at Unknown time  . traZODone (DESYREL) 150 MG tablet Take 150 mg by mouth at bedtime.     03/16/2015 at Unknown time  . ibuprofen (ADVIL,MOTRIN) 200 MG tablet Take 400 mg by mouth every 6 (six) hours as needed (pain).   10/01/2013 at Unknown time  . norethindrone-ethinyl estradiol (MICROGESTIN) 1-20 MG-MCG tablet Take 1 tablet by mouth daily. 3 Package 3 09/30/2013 at Unknown time  . oxyCODONE-acetaminophen (PERCOCET/ROXICET) 5-325 MG per tablet Take 1-2 tablets by mouth every 4 (four) hours as needed for severe pain. (Patient not taking: Reported on 03/05/2015) 20 tablet 0 Not Taking at Unknown time  . tamsulosin (FLOMAX) 0.4 MG CAPS capsule Take 1 capsule (0.4 mg total) by mouth daily. (Patient not taking: Reported on 03/05/2015) 10 capsule 0 Not Taking at Unknown time    Review of Systems  Constitutional: Negative.   HENT: Negative.   Eyes: Negative.   Respiratory: Negative.   Cardiovascular: Negative.   Gastrointestinal: Negative.   Genitourinary: Negative.   Musculoskeletal: Negative.   Skin: Negative.   Neurological: Negative.   Endo/Heme/Allergies: Negative.  Psychiatric/Behavioral: Negative.     Blood pressure 154/98, pulse 100, temperature 98.1 F (36.7 C), temperature source Oral, resp. rate 16, height 5' (1.524 m), weight 230 lb (104.327 kg), last menstrual period 02/16/2015, SpO2 100 %. Physical Exam  Constitutional: She is oriented to person, place, and time.  Morbidly obese  HENT:  Head: Normocephalic and atraumatic.  Eyes: EOM are normal.  Neck: Normal range of motion. Neck supple.  Cardiovascular: Normal rate and regular rhythm.   Respiratory: Effort normal and breath sounds normal.   GI: Soft. Bowel sounds are normal.  Exam limited by body habitus  Genitourinary:  Pelvic exam:  VULVA: normal appearing vulva with no masses, tenderness or lesions,  VAGINA: normal appearing vagina with normal color and discharge, no lesions,  CERVIX: normal appearing cervix without discharge or lesions,  UTERUS: uterus is normal size, shape, consistency and nontender,,Exam limited by body habitus  ADNEXA: no masses.Exam limited by body habitus  Musculoskeletal: Normal range of motion.  Neurological: She is alert and oriented to person, place, and time.  Skin: Skin is warm and dry.  Psychiatric: She has a normal mood and affect.    Results for orders placed or performed during the hospital encounter of 03/17/15 (from the past 24 hour(s))  Pregnancy, urine     Status: None   Collection Time: 03/17/15  8:03 AM  Result Value Ref Range   Preg Test, Ur NEGATIVE NEGATIVE     Assessment: HGSIL with negative ECC Morbid obesity  Plan Options for management include observation, ablative or excitional therapy.  Pt has chosen excision with LEEP under anesthestia.  The risks of anesthesia, infection, damage to adjacent organs and particularly for this surgery intraoperative and late bleeding were reviewed.  The patient  Acknowledges understanding and wishes to proceed.   [name] P 03/17/2015, 9:21 AM

## 2015-03-17 ENCOUNTER — Ambulatory Visit (HOSPITAL_COMMUNITY)
Admission: RE | Admit: 2015-03-17 | Discharge: 2015-03-17 | Disposition: A | Discharge status: Home / Self Care | Payer: 59 | Source: Ambulatory Visit | Attending: Obstetrics and Gynecology | Admitting: Obstetrics and Gynecology

## 2015-03-17 ENCOUNTER — Encounter (HOSPITAL_COMMUNITY)
Admission: RE | Disposition: A | Discharge status: Home / Self Care | Payer: Self-pay | Source: Ambulatory Visit | Attending: Obstetrics and Gynecology

## 2015-03-17 ENCOUNTER — Encounter (HOSPITAL_COMMUNITY): Payer: Self-pay | Admitting: Emergency Medicine

## 2015-03-17 ENCOUNTER — Ambulatory Visit (HOSPITAL_COMMUNITY): Payer: 59 | Admitting: Anesthesiology

## 2015-03-17 DIAGNOSIS — R87613 High grade squamous intraepithelial lesion on cytologic smear of cervix (HGSIL): Secondary | ICD-10-CM | POA: Diagnosis present

## 2015-03-17 DIAGNOSIS — F419 Anxiety disorder, unspecified: Secondary | ICD-10-CM | POA: Diagnosis not present

## 2015-03-17 DIAGNOSIS — Z87442 Personal history of urinary calculi: Secondary | ICD-10-CM | POA: Diagnosis not present

## 2015-03-17 DIAGNOSIS — Z6841 Body Mass Index (BMI) 40.0 and over, adult: Secondary | ICD-10-CM

## 2015-03-17 DIAGNOSIS — IMO0002 Reserved for concepts with insufficient information to code with codable children: Secondary | ICD-10-CM | POA: Diagnosis present

## 2015-03-17 DIAGNOSIS — E8881 Metabolic syndrome: Secondary | ICD-10-CM | POA: Diagnosis not present

## 2015-03-17 DIAGNOSIS — Z87891 Personal history of nicotine dependence: Secondary | ICD-10-CM | POA: Insufficient documentation

## 2015-03-17 DIAGNOSIS — N870 Mild cervical dysplasia: Secondary | ICD-10-CM | POA: Insufficient documentation

## 2015-03-17 DIAGNOSIS — E282 Polycystic ovarian syndrome: Secondary | ICD-10-CM | POA: Insufficient documentation

## 2015-03-17 DIAGNOSIS — Z79899 Other long term (current) drug therapy: Secondary | ICD-10-CM | POA: Diagnosis not present

## 2015-03-17 HISTORY — PX: LEEP: SHX91

## 2015-03-17 LAB — PREGNANCY, URINE: Preg Test, Ur: NEGATIVE

## 2015-03-17 SURGERY — LEEP (LOOP ELECTROSURGICAL EXCISION PROCEDURE)
Anesthesia: General

## 2015-03-17 MED ORDER — DEXAMETHASONE SODIUM PHOSPHATE 10 MG/ML IJ SOLN
INTRAMUSCULAR | Status: DC | PRN
  Administered 2015-03-17: 8 mg via INTRAVENOUS

## 2015-03-17 MED ORDER — PROPOFOL 10 MG/ML IV BOLUS
INTRAVENOUS | Status: DC | PRN
  Administered 2015-03-17: 250 mg via INTRAVENOUS
  Administered 2015-03-17: 40 mg via INTRAVENOUS

## 2015-03-17 MED ORDER — FENTANYL CITRATE (PF) 250 MCG/5ML IJ SOLN
INTRAMUSCULAR | Status: AC
  Filled 2015-03-17: qty 5

## 2015-03-17 MED ORDER — VASOPRESSIN 20 UNIT/ML IV SOLN
INTRAVENOUS | Status: DC | PRN
  Administered 2015-03-17: 30 mL via INTRAMUSCULAR

## 2015-03-17 MED ORDER — IODINE STRONG (LUGOLS) 5 % PO SOLN
ORAL | Status: DC | PRN
  Administered 2015-03-17: 8 mL via ORAL

## 2015-03-17 MED ORDER — FERRIC SUBSULFATE 259 MG/GM EX SOLN
CUTANEOUS | Status: AC
  Filled 2015-03-17: qty 8

## 2015-03-17 MED ORDER — KETOROLAC TROMETHAMINE 30 MG/ML IJ SOLN
INTRAMUSCULAR | Status: DC | PRN
  Administered 2015-03-17: 30 mg via INTRAVENOUS

## 2015-03-17 MED ORDER — LIDOCAINE HCL (CARDIAC) 20 MG/ML IV SOLN
INTRAVENOUS | Status: DC | PRN
  Administered 2015-03-17: 40 mg via INTRAVENOUS
  Administered 2015-03-17: 100 mg via INTRAVENOUS

## 2015-03-17 MED ORDER — ONDANSETRON HCL 4 MG/2ML IJ SOLN: 4.0000 mg | Freq: Once | INTRAMUSCULAR | Status: DC | PRN

## 2015-03-17 MED ORDER — ONDANSETRON HCL 4 MG/2ML IJ SOLN
INTRAMUSCULAR | Status: AC
  Filled 2015-03-17: qty 2

## 2015-03-17 MED ORDER — ACETAMINOPHEN 10 MG/ML IV SOLN
1000.0000 mg | Freq: Once | INTRAVENOUS | Status: AC
  Administered 2015-03-17: 1000 mg via INTRAVENOUS
  Filled 2015-03-17: qty 100

## 2015-03-17 MED ORDER — LIDOCAINE HCL (CARDIAC) 20 MG/ML IV SOLN
INTRAVENOUS | Status: AC
  Filled 2015-03-17: qty 5

## 2015-03-17 MED ORDER — ACETIC ACID 5 % SOLN
Status: AC
  Filled 2015-03-17: qty 500

## 2015-03-17 MED ORDER — FERRIC SUBSULFATE 259 MG/GM EX SOLN
CUTANEOUS | Status: DC | PRN
  Administered 2015-03-17: 1

## 2015-03-17 MED ORDER — FENTANYL CITRATE (PF) 100 MCG/2ML IJ SOLN
INTRAMUSCULAR | Status: DC
Start: 2015-03-17 — End: 2015-03-17
  Filled 2015-03-17: qty 2

## 2015-03-17 MED ORDER — IODINE STRONG (LUGOLS) 5 % PO SOLN
ORAL | Status: AC
  Filled 2015-03-17: qty 1

## 2015-03-17 MED ORDER — HYDROCODONE-ACETAMINOPHEN 5-325 MG PO TABS: 1.0000 | ORAL_TABLET | Freq: Four times a day (QID) | ORAL | Status: DC | PRN

## 2015-03-17 MED ORDER — MIDAZOLAM HCL 2 MG/2ML IJ SOLN
INTRAMUSCULAR | Status: AC
  Filled 2015-03-17: qty 2

## 2015-03-17 MED ORDER — KETOROLAC TROMETHAMINE 30 MG/ML IJ SOLN
INTRAMUSCULAR | Status: AC
  Filled 2015-03-17: qty 1

## 2015-03-17 MED ORDER — MIDAZOLAM HCL 2 MG/2ML IJ SOLN
INTRAMUSCULAR | Status: DC | PRN
  Administered 2015-03-17: 2 mg via INTRAVENOUS

## 2015-03-17 MED ORDER — SCOPOLAMINE 1 MG/3DAYS TD PT72
1.0000 | MEDICATED_PATCH | TRANSDERMAL | Status: DC
  Administered 2015-03-17: 1.5 mg via TRANSDERMAL

## 2015-03-17 MED ORDER — FENTANYL CITRATE (PF) 100 MCG/2ML IJ SOLN
25.0000 ug | INTRAMUSCULAR | Status: DC | PRN
  Administered 2015-03-17 (×2): 50 ug via INTRAVENOUS

## 2015-03-17 MED ORDER — FENTANYL CITRATE (PF) 100 MCG/2ML IJ SOLN
INTRAMUSCULAR | Status: DC | PRN
  Administered 2015-03-17: 100 ug via INTRAVENOUS
  Administered 2015-03-17 (×3): 25 ug via INTRAVENOUS
  Administered 2015-03-17: 50 ug via INTRAVENOUS
  Administered 2015-03-17 (×3): 25 ug via INTRAVENOUS

## 2015-03-17 MED ORDER — VASOPRESSIN 20 UNIT/ML IV SOLN
INTRAVENOUS | Status: AC
  Filled 2015-03-17: qty 1

## 2015-03-17 MED ORDER — LACTATED RINGERS IV SOLN
INTRAVENOUS | Status: DC
  Administered 2015-03-17 (×2): via INTRAVENOUS

## 2015-03-17 MED ORDER — PROPOFOL 10 MG/ML IV BOLUS
INTRAVENOUS | Status: AC
  Filled 2015-03-17: qty 20

## 2015-03-17 MED ORDER — SODIUM CHLORIDE 0.9 % IJ SOLN
INTRAMUSCULAR | Status: AC
  Filled 2015-03-17: qty 50

## 2015-03-17 MED ORDER — FENTANYL CITRATE (PF) 100 MCG/2ML IJ SOLN
INTRAMUSCULAR | Status: AC
  Filled 2015-03-17: qty 2

## 2015-03-17 MED ORDER — SILVER NITRATE-POT NITRATE 75-25 % EX MISC
CUTANEOUS | Status: DC | PRN
  Administered 2015-03-17: 1

## 2015-03-17 MED ORDER — IBUPROFEN 600 MG PO TABS: ORAL_TABLET | ORAL | Status: DC

## 2015-03-17 MED ORDER — DEXAMETHASONE SODIUM PHOSPHATE 10 MG/ML IJ SOLN
INTRAMUSCULAR | Status: AC
  Filled 2015-03-17: qty 1

## 2015-03-17 MED ORDER — GLYCOPYRROLATE 0.2 MG/ML IJ SOLN
INTRAMUSCULAR | Status: DC | PRN
  Administered 2015-03-17: 0.1 mg via INTRAVENOUS

## 2015-03-17 MED ORDER — ONDANSETRON HCL 4 MG/2ML IJ SOLN
INTRAMUSCULAR | Status: DC | PRN
  Administered 2015-03-17: 4 mg via INTRAVENOUS

## 2015-03-17 MED ORDER — SCOPOLAMINE 1 MG/3DAYS TD PT72
MEDICATED_PATCH | TRANSDERMAL | Status: AC
  Filled 2015-03-17: qty 1

## 2015-03-17 SURGICAL SUPPLY — 28 items
APPLICATOR COTTON TIP 6IN STRL (MISCELLANEOUS) ×3 IMPLANT
CLOTH BEACON ORANGE TIMEOUT ST (SAFETY) ×3 IMPLANT
COUNTER NEEDLE 1200 MAGNETIC (NEEDLE) IMPLANT
ELECT BALL LEEP 5MM RED (ELECTRODE) ×3 IMPLANT
ELECT LOOP LEEP RND 15X12 GRN (CUTTING LOOP) ×3
ELECT LOOP LEEP RND 20X12 WHT (CUTTING LOOP)
ELECT REM PT RETURN 9FT ADLT (ELECTROSURGICAL) ×3
ELECTRODE LOOP LP RND 15X12GRN (CUTTING LOOP) ×1 IMPLANT
ELECTRODE LOOP LP RND 20X12WHT (CUTTING LOOP) IMPLANT
ELECTRODE REM PT RTRN 9FT ADLT (ELECTROSURGICAL) ×1 IMPLANT
EVACUATOR PREFILTER SMOKE (MISCELLANEOUS) ×3 IMPLANT
EXTENDER ELECT LOOP LEEP 10CM (CUTTING LOOP) IMPLANT
GLOVE SURG SS PI 6.5 STRL IVOR (GLOVE) ×6 IMPLANT
GOWN STRL REUS W/TWL LRG LVL3 (GOWN DISPOSABLE) ×6 IMPLANT
HOSE NS SMOKE EVAC 7/8 X6 (MISCELLANEOUS) ×2 IMPLANT
HOSE NS SMOKE EVAC 7/8 X6' (MISCELLANEOUS) ×1
NS IRRIG 1000ML POUR BTL (IV SOLUTION) ×3 IMPLANT
PACK VAGINAL MINOR WOMEN LF (CUSTOM PROCEDURE TRAY) ×3 IMPLANT
PAD OB MATERNITY 4.3X12.25 (PERSONAL CARE ITEMS) ×3 IMPLANT
PENCIL BUTTON HOLSTER BLD 10FT (ELECTRODE) ×3 IMPLANT
REDUCER FITTING SMOKE EVAC (MISCELLANEOUS) ×3 IMPLANT
SCOPETTES 8  STERILE (MISCELLANEOUS) ×4
SCOPETTES 8 STERILE (MISCELLANEOUS) ×2 IMPLANT
SPONGE SURGIFOAM ABS GEL 12-7 (HEMOSTASIS) ×3 IMPLANT
SUT VIC AB 4-0 PS2 27 (SUTURE) ×3 IMPLANT
TOWEL OR 17X24 6PK STRL BLUE (TOWEL DISPOSABLE) ×6 IMPLANT
TUBING SMOKE EVAC HOSE ADAPTER (MISCELLANEOUS) ×3 IMPLANT
WATER STERILE IRR 1000ML POUR (IV SOLUTION) ×3 IMPLANT

## 2015-03-17 NOTE — Anesthesia Postprocedure Evaluation (Signed)
  Anesthesia Post-op Note  Patient: [name]  Procedure(s) Performed: Procedure(s) (LRB): LOOP ELECTROSURGICAL EXCISION PROCEDURE (LEEP) and Cold Knife Conization (N/A)  Patient Location: PACU  Anesthesia Type: General  Level of Consciousness: awake and alert   Airway and Oxygen Therapy: Patient Spontanous Breathing  Post-op Pain: mild  Post-op Assessment: Post-op Vital signs reviewed, Patient's Cardiovascular Status Stable, Respiratory Function Stable, Patent Airway and No signs of Nausea or vomiting  Last Vitals:  Filed Vitals:   03/17/15 1230  BP: 136/80  Pulse: 107  Temp: 36.6 C  Resp: 20    Post-op Vital Signs: stable   Complications: No apparent anesthesia complications

## 2015-03-17 NOTE — Discharge Instructions (Signed)
Conization of the Cervix, Care After Refer to this sheet in the next few weeks. These instructions provide you with information on caring for yourself after your procedure. Your health care provider may also give you more specific instructions. Your treatment has been planned according to current medical practices but problems sometimes occur. Call your health care provider if you have any problems or questions after your procedure. WHAT TO EXPECT AFTER THE PROCEDURE After your procedure, it is typical to have the following sensations:  If you had a general anesthetic, you may be groggy for 2-3 hours after the procedure.  You may have cramps (similar to menstrual cramps) for about 1 week.   You may have a bloody discharge or light to moderate bleeding for 1-2 weeks. The bleeding should not be heavy (for example, it should not soak 1 pad in less than 1 hour).  You may have a black vaginal discharge that looks similar to coffee grounds. This is from the paste that was applied to the cervix to control bleeding. This is normal. Recovery may take up to 3 weeks.  HOME CARE INSTRUCTIONS   Arrange for someone to drive you home after the procedure.  Only take medicines as directed by your health care provider. Do not take aspirin. It can cause bleeding.   Take showers for the first week. Do not take baths, swim, or use hot tubs until your health care provider says it is okay.   Do not douche, use tampons, or have sexual intercourse until your health care provider says it is okay.   Avoid strenuous activities, exercises, and heavy lifting for at least 7-14 days.  You may resume your normal diet unless your health care provider advises you differently.    If you are constipated, you may:  Take a mild laxative as directed by your health care provider.   Add fruit and bran to your diet.   Make sure to drink enough fluids to keep your urine clear or pale yellow.  Keep follow-up  appointments with your health care provider. SEEK MEDICAL CARE IF:   You develop a rash.   You are dizzy or lightheaded.   You feel nauseous.   You develop a bad smelling vaginal discharge. SEEK IMMEDIATE MEDICAL CARE IF:   You have blood clots or bleeding that is heavier than a normal menstrual period (for example, soaking a pad in less than 1 hour) or you develop bright red bleeding.   You have a fever over 101F (38.3C) or persistent symptoms for more than 2-3 days.   You have a fever over 101F (38.3C) and your symptoms suddenly get worse.  You have increasing cramps.   You faint.   You have pain when urinating.  You have bloody urine.   You start vomiting.   Your pain is not relieved with your medicine.   Your have severe or worsening pain. MAKE SURE YOU:  Understand these instructions.  Will watch your condition.  Will get help right away if you are not doing well or get worse. Document Released: 10/11/2005 Document Revised: 10/16/2013 Document Reviewed: 04/06/2013 [facility] Patient Information 2015 [place], [place]. This information is not intended to replace advice given to you by your health care provider. Make sure you discuss any questions you have with your health care provider.

## 2015-03-17 NOTE — Op Note (Signed)
Procedure(s):  Cold Knife Conization Procedure Note  [name] female 39 y.o. 03/17/2015  Procedure(s) and Anesthesia Type:   Cold Knife Conization - General  Surgeon(s) and Role:    * Eldred Manges, MD - Primary    Indications: The patient was admitted to the hospital for management of high-grade intraepithelial neoplasia.   Surgeon: Eldred Manges   Assistants: none  Anesthesia: General endotracheal anesthesia  ASA Class: 3    Procedure Detail the patient was taken to the operating room and placed on the operating table . After appropriate identification.  She underwent induction of general anesthesia. She was placed in the lithotomy position. After a surgical time out. The perineum was prepped with multiple layers of Betadine as was the outer vagina. A red Robinson catheter was used to empty the bladder. The perineum was draped as a sterile field. A gray speculum was placed in the vagina for visualization of the cervix. Several sizes of speculum needed to be tried in an attempt to obtain the best visualization. Lateral wall retractors were also employed. The cervix was injected with a dilute solution of Pitressin in the area of the transition zone for a total of 30 cc. A single-tooth tenaculum was placed on the anterior cervix outside the transition zone and an attempt made to use the loop electrode for excision of the transition zone. However, difficulties with visualization cause the surgeon to determine this was not in the patient's best interest. At that time. Anchor sutures were placed at the 3 and 9:00 positions on the cervix with 0 Vicryl. A cone shaped specimen was then excised sharply with a cold knife, marked at the o'clock position with a suture, and removed from the operative field. The remaining endocervical area was curetted and that sent as a separate specimen. Hemostasis was achieved with Sturmdorf sutures at the 12 and 6:00 positions. However, an additional  hemostatic suture was placed at the 6:00 position as well. Gelfoam was placed in the conization bed. Hemostasis was noted to be adequate. All instruments were removed from the vagina and the patient taken to the recovery room in satisfactory condition having tolerated the procedure well with sponge and instrument counts correct.   LOOP ELECTROSURGICAL EXCISION PROCEDURE (LEEP) converted to a  Cold Knife Conization  Findings:  Lugol's nonstaining area extended from the 5 to 7:00 position on the cervix.  Estimated Blood Loss:  less than 100 mL         Drains: None         Blood Given: none          Specimens: Cervical conization and endocervical curettings.               Complications:  The patient's body mass index made visualization of the cervix difficult, even using the best instrumentation available. The decision was made that electrical excision in the presence of this limited visualization was not in the patient's best interest and procedure was converted to a cold knife conization.         Disposition: PACU - hemodynamically stable.         Condition: stable

## 2015-03-17 NOTE — Transfer of Care (Signed)
Immediate Anesthesia Transfer of Care Note  Patient: [name]  Procedure(s) Performed: Procedure(s): LOOP ELECTROSURGICAL EXCISION PROCEDURE (LEEP) and Cold Knife Conization (N/A)  Patient Location: PACU  Anesthesia Type:General  Level of Consciousness: awake, alert , oriented and patient cooperative  Airway & Oxygen Therapy: Patient Spontanous Breathing and Patient connected to nasal cannula oxygen  Post-op Assessment: Report given to RN and Post -op Vital signs reviewed and stable  Post vital signs: Reviewed and stable  Last Vitals:  Filed Vitals:   03/17/15 0759  BP: 154/98  Pulse: 100  Temp: 36.7 C  Resp: 16    Complications: No apparent anesthesia complications

## 2015-03-17 NOTE — Anesthesia Procedure Notes (Signed)
Procedure Name: LMA Insertion Date/Time: 03/17/2015 9:39 AM Performed by: Georgeanne Nim Pre-anesthesia Checklist: Patient identified, Emergency Drugs available, Suction available, Patient being monitored and Timeout performed Patient Re-evaluated:Patient Re-evaluated prior to inductionOxygen Delivery Method: Circle system utilized Preoxygenation: Pre-oxygenation with 100% oxygen Intubation Type: IV induction LMA: LMA with gastric port inserted Number of attempts: 1 Airway Equipment and Method: Patient positioned with wedge pillow Placement Confirmation: positive ETCO2,  CO2 detector and breath sounds checked- equal and bilateral Tube secured with: Tape

## 2015-03-18 ENCOUNTER — Encounter (HOSPITAL_COMMUNITY): Payer: Self-pay | Admitting: Obstetrics and Gynecology

## 2015-03-28 ENCOUNTER — Encounter: Payer: Self-pay | Admitting: Family Medicine

## 2015-03-28 ENCOUNTER — Ambulatory Visit (INDEPENDENT_AMBULATORY_CARE_PROVIDER_SITE_OTHER): Payer: 59 | Admitting: Family Medicine

## 2015-03-28 VITALS — BP 152/92 | HR 90 | Temp 98.4°F | Ht 60.75 in | Wt 255.7 lb

## 2015-03-28 DIAGNOSIS — G43009 Migraine without aura, not intractable, without status migrainosus: Secondary | ICD-10-CM

## 2015-03-28 DIAGNOSIS — N200 Calculus of kidney: Secondary | ICD-10-CM | POA: Diagnosis not present

## 2015-03-28 DIAGNOSIS — R87619 Unspecified abnormal cytological findings in specimens from cervix uteri: Secondary | ICD-10-CM | POA: Diagnosis not present

## 2015-03-28 DIAGNOSIS — Z23 Encounter for immunization: Secondary | ICD-10-CM

## 2015-03-28 DIAGNOSIS — I10 Essential (primary) hypertension: Secondary | ICD-10-CM

## 2015-03-28 DIAGNOSIS — F39 Unspecified mood [affective] disorder: Secondary | ICD-10-CM | POA: Insufficient documentation

## 2015-03-28 DIAGNOSIS — Z7689 Persons encountering health services in other specified circumstances: Secondary | ICD-10-CM

## 2015-03-28 DIAGNOSIS — Z7189 Other specified counseling: Secondary | ICD-10-CM

## 2015-03-28 DIAGNOSIS — M545 Low back pain: Secondary | ICD-10-CM | POA: Diagnosis not present

## 2015-03-28 MED ORDER — LOSARTAN POTASSIUM 50 MG PO TABS: 50.0000 mg | ORAL_TABLET | Freq: Every day | ORAL | Status: DC

## 2015-03-28 NOTE — Addendum Note (Signed)
Addended by: Agnes Lawrence on: 03/28/2015 01:16 PM   Modules accepted: Orders

## 2015-03-28 NOTE — Progress Notes (Signed)
HPI:  [name] is here to establish care. Used to go to [facility], but had a bad experience and changing PCP. Last PCP and physical:  Has the following chronic problems that require follow up and concerns today:  Elevated BP: -never on treatments in the past -but has been up on several occasions in the last several months at her ob office and [facility]  (reports at [facility] was up even on recheck) -denies: CP, SOB, DOE, swelling, HA changes -reports labs with prior PCP ok  Depression and Anxiety: -Seeing Dr. Clovis Pu at [facility] - on trazadone and sertraline and reports is doing better now then she has in a long time - no hx of bipolar disorder, SI or hospitalization -no counseling  Hx of migraines and frequent headaches: -started when she was in college > 15 years ago -tend to be R sided temporal with nausea and vomiting occ, sometimes tension headaches -usually takes tylenol which works ok -no aura -has 1 headache every 1-2 months, now, were more frequent in the past  Hey fever, Allergies: -chronic -minor nasal symptoms  Abnormal pap/PCOS: -seeing her gynecologist for this, s/p LEEP  Hx of nephrolithiasis: -go[facility] to [facility] when has a stone -has not had any issues in several years -hx lithotripsy  Chronic intermittent low back pain: -since pregnancy in 2008 -intermittent spasms in back, occur a few times per week -does not take anything, brief -denies: weakness, numbness, radiation to extremities, bowel or bladder incontinence  ROS negative for unless reported above: fevers, unintentional weight loss, hearing or vision loss, chest pain, palpitations, struggling to breath, hemoptysis, melena, hematochezia, hematuria, falls, loc, si, thoughts of self harm  Past Medical History  Diagnosis Date  . PCOS (polycystic ovarian syndrome)     sees gyn, hx mild insulin resistance as a teenager per her report  . Anxiety and depression     sees Dr. Clovis Pu  . Abnormal pap  06/20/07    ASCUS  . Kidney stones     sees [facility]  . Vaginal Pap smear, abnormal     HGSIL, s/p leep in 2016 with Dr. Leo Grosser  . Allergy   . Diverticulitis   . UTI (lower urinary tract infection)   . Migraines   . LGSIL (low grade squamous intraepithelial dysplasia) 12/21/2012    Noted at colposcopy.  Repeat pap and HPV 16/18 in 2015   . HGSIL (high grade squamous intraepithelial dysplasia) 03/16/2015    Noted on colposcopically directed biopsy.     Past Surgical History  Procedure Laterality Date  . Cesarean section    . Wisdom tooth extraction Bilateral   . Lithotripsy      2013  . Leep N/A 03/17/2015    Procedure: LOOP ELECTROSURGICAL EXCISION PROCEDURE (LEEP) and Cold Knife Conization;  Surgeon: Eldred Manges, MD;  Location: [facility];  Service: Gynecology;  Laterality: N/A;    Family History  Problem Relation Age of Onset  . Arthritis Mother   . Breast cancer Paternal Aunt   . Mental illness Father     History   Social History  . Marital Status: Married    Spouse Name: N/A  . Number of Children: N/A  . Years of Education: N/A   Social History Main Topics  . Smoking status: Former Smoker -- 0.25 packs/day    Types: Cigarettes    Quit date: 09/07/1998  . Smokeless tobacco: Never Used  . Alcohol Use: Yes     Comment: very occasionally  .  Drug Use: No  . Sexual Activity: Yes    Birth Control/ Protection: Pill     Comment: microgestin   Other Topics Concern  . None   Social History Narrative   Works in Therapist, art   Married     Current outpatient prescriptions:  .  ibuprofen (ADVIL,MOTRIN) 600 MG tablet, Ibuprofen 600 mg every 6 hours for 3 days and then every 6 hours as needed for pain, Disp: 60 tablet, Rfl: 0 .  norethindrone (MICRONOR,CAMILA,ERRIN) 0.35 MG tablet, Take 1 tablet by mouth daily., Disp: , Rfl:  .  sertraline (ZOLOFT) 100 MG tablet, Take 150 mg by mouth daily. , Disp: , Rfl:  .  traZODone (DESYREL) 150 MG tablet, Take 150  mg by mouth at bedtime.  , Disp: , Rfl:  .  losartan (COZAAR) 50 MG tablet, Take 1 tablet (50 mg total) by mouth daily., Disp: 30 tablet, Rfl: 3  EXAM:  Filed Vitals:   03/28/15 1106  BP: 152/92  Pulse: 90  Temp: 98.4 F (36.9 C)    Body mass index is 48.72 kg/(m^2).  GENERAL: vitals reviewed and listed above, alert, oriented, appears well hydrated and in no acute distress  HEENT: atraumatic, conjunttiva clear, no obvious abnormalities on inspection of external nose and ears  NECK: no obvious masses on inspection  LUNGS: clear to auscultation bilaterally, no wheezes, rales or rhonchi, good air movement  CV: HRRR, no peripheral edema  MS: moves all extremities without noticeable abnormality  PSYCH: pleasant and cooperative, no obvious depression or anxiety  ASSESSMENT AND PLAN:  Discussed the following assessment and plan:  Mood disorder - Anxiety and Depression -managed by her psychiatrist -doing well per her report  Migraine without aura and without status migrainosus, not intractable -rare, mild, treated with OTC options  Abnormal cervical Papanicolaou smear, unspecified abnormal pap finding -managed by her gynecologist  Nephrolithiasis -sees urology  Low back pain without sciatica, unspecified back pain laterality -no findings on exam, suspect OA or DDD mild without neurodeficits or severe symptoms; opted for HEP and follow up in 1 month with imaging if  Not improving  Essential hypertension - Plan: losartan (COZAAR) 50 MG tablet -discussed options and she decided to try losartan along with lifestyle changes; close follow up with labs in one month  Encounter to establish care -We reviewed the PMH, PSH, FH, SH, Meds and Allergies. -We provided refills for any medications we will prescribe as needed. -We addressed current concerns per orders and patient instructions. -We have asked for records for pertinent exams, studies, vaccines and notes from previous  providers. -We have advised patient to follow up per instructions below.   -Patient advised to return or notify a doctor immediately if symptoms worsen or persist or new concerns arise.  Patient Instructions  BEFORE YOU LEAVE: -Tdap -low back exercises -schedule follow up in 1 month - come fasting BUT, drink plenty of water and take your blood pressure medication before coming  Start the Losartan 21m once daily for your blood pressure  Do the back exercises at least 4 days per week  We recommend the following healthy lifestyle measures: - eat a healthy diet consisting of lots of vegetables, fruits, beans, nuts, seeds, healthy meats such as white chicken and fish - avoid starches, sweets, carbs, fried foods, fast food, processed foods, sodas, red meet and other fattening foods.  - get a least 150-300 minutes of aerobic exercise per week.       KColin BentonR.

## 2015-03-28 NOTE — Patient Instructions (Addendum)
BEFORE YOU LEAVE: -Tdap -low back exercises -schedule follow up in 1 month - come fasting BUT, drink plenty of water and take your blood pressure medication before coming  Start the Losartan 38m once daily for your blood pressure  Do the back exercises at least 4 days per week  We recommend the following healthy lifestyle measures: - eat a healthy diet consisting of lots of vegetables, fruits, beans, nuts, seeds, healthy meats such as white chicken and fish - avoid starches, sweets, carbs, fried foods, fast food, processed foods, sodas, red meet and other fattening foods.  - get a least 150-300 minutes of aerobic exercise per week.

## 2015-03-28 NOTE — Progress Notes (Signed)
Pre visit review using our clinic review tool, if applicable. No additional management support is needed unless otherwise documented below in the visit note. 

## 2015-04-07 ENCOUNTER — Telehealth: Payer: Self-pay

## 2015-04-07 DIAGNOSIS — I10 Essential (primary) hypertension: Secondary | ICD-10-CM

## 2015-04-07 MED ORDER — LOSARTAN POTASSIUM 50 MG PO TABS
50.0000 mg | ORAL_TABLET | Freq: Every day | ORAL | Status: DC
Start: 2015-04-07 — End: 2015-05-02

## 2015-04-07 NOTE — Telephone Encounter (Signed)
Rx done. 

## 2015-04-07 NOTE — Telephone Encounter (Signed)
[facility]: losartan (COZAAR) 50 MG tablet

## 2015-04-26 ENCOUNTER — Ambulatory Visit (INDEPENDENT_AMBULATORY_CARE_PROVIDER_SITE_OTHER): Payer: 59 | Admitting: Family Medicine

## 2015-04-26 ENCOUNTER — Encounter: Payer: Self-pay | Admitting: Family Medicine

## 2015-04-26 VITALS — BP 128/82 | HR 104 | Temp 98.1°F | Ht 60.75 in | Wt 259.5 lb

## 2015-04-26 DIAGNOSIS — J209 Acute bronchitis, unspecified: Secondary | ICD-10-CM | POA: Diagnosis not present

## 2015-04-26 MED ORDER — PREDNISONE 20 MG PO TABS: ORAL_TABLET | ORAL | Status: DC

## 2015-04-26 NOTE — Progress Notes (Signed)
Pre visit review using our clinic review tool, if applicable. No additional management support is needed unless otherwise documented below in the visit note. 

## 2015-04-26 NOTE — Patient Instructions (Addendum)
With productive cough/cold/sore throat for 3 weeks- concern for more of a laryngitis vs. Bronchitis and given your wheeze at times probably the latter. We will trial prednisone to see if this helps your symptoms but unfortunately it often takes 4-6 weeks for this to go away completely.   Follow up with Dr. Maudie Mercury as planned and can decide if further workup needed

## 2015-04-26 NOTE — Progress Notes (Signed)
PCP: Lucretia Kern., DO  Subjective:  [name] is a 39 y.o. year old very pleasant female patient who presents cough/cold symptoms -Started 3 weeks ago. Reports productive cough with green phlegm. Reports mild trouble swallowing as well as pain in her tongue while swallowing. Does report some wheeze. Shortness of breath only with coughing fits. Mildly improving. No sick contacts.   [facility] for tessalon but this has not helped her cough.   ROS- no fever, chills, nausea, vomiting. No dental pain. No sinus pressure.   Pertinent Past Medical History- hypertension, mood disorder, PCOS, morbid obesity  Medications- reviewed  Current Outpatient Prescriptions  Medication Sig Dispense Refill  . benzonatate (TESSALON) 100 MG capsule TAKE 1 TO 2 CAPSULES BY MOUTH 3 TIMES A DAY AS NEEDED FOR COUGH FOR UP TO 10 DAYS  0  . ibuprofen (ADVIL,MOTRIN) 600 MG tablet Ibuprofen 600 mg every 6 hours for 3 days and then every 6 hours as needed for pain 60 tablet 0  . losartan (COZAAR) 50 MG tablet Take 1 tablet (50 mg total) by mouth daily. 90 tablet 1  . norethindrone (MICRONOR,CAMILA,ERRIN) 0.35 MG tablet Take 1 tablet by mouth daily.    . sertraline (ZOLOFT) 100 MG tablet Take 150 mg by mouth daily.     . traZODone (DESYREL) 150 MG tablet Take 150 mg by mouth at bedtime.       Objective: BP 128/82 mmHg  Pulse 104  Temp(Src) 98.1 F (36.7 C) (Oral)  Ht 5' 0.75" (1.543 m)  Wt 259 lb 8 oz (117.708 kg)  BMI 49.44 kg/m2  SpO2 98%  LMP 02/16/2015 Gen: NAD, resting comfortably, coughing spells during visit lasting about 10 seconds HEENT: Turbinates erythematous but without drainage, TM normal, pharynx erythematous with mild tonsilar enlargement but no exudate, no sinus pressure CV: RRR (not tachy on exam) no murmurs rubs or gallops Lungs: no crackles, wheeze. Slight coarse breath sounds throughout Abdomen: soft/nontender/nondistended/normal bowel sounds. No rebound or  guarding.  Ext: no edema Skin: warm, dry, no rash  Assessment/Plan:  Productive cough/wheeze/sore throat for 3 weeks with slight rhonchi on exam and only mild improvement over 3 weeks. Suspect this is either a bronchitis or laryngitis.  We will trial a 7 day prednisone course- discussed side effects and weight gain concern and has follow up with PCP later this week. No obvious bacterial infection found on exam-with 3 weeks of cough, could consider x-ray but with normal sats, no shortness of breath held off on this today.   Return precautions advised.   Tessalon was given by urgent care.  Meds ordered this encounter  Medications  . benzonatate (TESSALON) 100 MG capsule    Sig: TAKE 1 TO 2 CAPSULES BY MOUTH 3 TIMES A DAY AS NEEDED FOR COUGH FOR UP TO 10 DAYS    Refill:  0  . predniSONE (DELTASONE) 20 MG tablet    Sig: Take 2 tabs daily for 2 days, then take 1 tab daily for 5 days    Dispense:  10 tablet    Refill:  0

## 2015-05-02 ENCOUNTER — Ambulatory Visit (INDEPENDENT_AMBULATORY_CARE_PROVIDER_SITE_OTHER): Payer: 59 | Admitting: Family Medicine

## 2015-05-02 ENCOUNTER — Encounter: Payer: Self-pay | Admitting: Family Medicine

## 2015-05-02 DIAGNOSIS — I10 Essential (primary) hypertension: Secondary | ICD-10-CM | POA: Diagnosis not present

## 2015-05-02 DIAGNOSIS — M545 Low back pain: Secondary | ICD-10-CM | POA: Diagnosis not present

## 2015-05-02 DIAGNOSIS — J209 Acute bronchitis, unspecified: Secondary | ICD-10-CM

## 2015-05-02 DIAGNOSIS — F39 Unspecified mood [affective] disorder: Secondary | ICD-10-CM

## 2015-05-02 LAB — BASIC METABOLIC PANEL
BUN: 13 mg/dL (ref 6–23)
CALCIUM: 9.4 mg/dL (ref 8.4–10.5)
CO2: 24 mEq/L (ref 19–32)
Chloride: 102 mEq/L (ref 96–112)
Creatinine, Ser: 0.63 mg/dL (ref 0.40–1.20)
GFR: 111.79 mL/min (ref >60.00)
Glucose, Bld: 112 mg/dL — ABNORMAL HIGH (ref 70–99)
POTASSIUM: 3.8 meq/L (ref 3.5–5.1)
Sodium: 138 mEq/L (ref 135–145)

## 2015-05-02 LAB — LIPID PANEL
CHOL/HDL RATIO: 5
CHOLESTEROL: 259 mg/dL — AB (ref 0–200)
HDL: 56.4 mg/dL (ref >39.00)
NonHDL: 202.6
Triglycerides: 277 mg/dL — ABNORMAL HIGH (ref 0.0–149.0)
VLDL: 55.4 mg/dL — AB (ref 0.0–40.0)

## 2015-05-02 LAB — LDL CHOLESTEROL, DIRECT: Direct LDL: 142 mg/dL

## 2015-05-02 LAB — HEMOGLOBIN A1C: Hgb A1c MFr Bld: 6.7 % — ABNORMAL HIGH (ref 4.6–6.5)

## 2015-05-02 MED ORDER — LOSARTAN POTASSIUM 50 MG PO TABS: 75.0000 mg | ORAL_TABLET | Freq: Every day | ORAL | Status: DC

## 2015-05-02 NOTE — Progress Notes (Signed)
Pre visit review using our clinic review tool, if applicable. No additional management support is needed unless otherwise documented below in the visit note. 

## 2015-05-02 NOTE — Patient Instructions (Signed)
BEFORE YOU LEAVE: -labs -schedule physical in 3-4 months  Increase losartan to 75 mg dialy (1.5 tablets daily)  We recommend the following healthy lifestyle measures: - eat a healthy diet consisting of lots of vegetables, fruits, beans, nuts, seeds, healthy meats such as white chicken and fish and whole grains.  - avoid fried foods, fast food, processed foods, sodas, red meet and other fattening foods.  - get a least 150 minutes of aerobic exercise per week.

## 2015-05-02 NOTE — Progress Notes (Signed)
HPI:  Treated for bronchitis in urgent care last weekend, doing much better, mild cough. No SOB.  Elevated BP: -started losartan 50 03/2015 -denies: CP, SOB, DOE, swelling, HA changes -reports labs with prior PCP ok  Depression and Anxiety: -Seeing Dr. Clovis Pu at [facility] - on trazadone and sertraline and reports is doing better now then she has in a long time - no hx of bipolar disorder, SI or hospitalization -no counseling  Hx of migraines and frequent headaches: -started when she was in college > 15 years ago -tend to be R sided temporal with nausea and vomiting occ, sometimes tension headaches -usually takes tylenol which works ok -no aura -has 1 headache every 1-2 months, now, were more frequent in the past  Hey fever, Allergies: -chronic -minor nasal symptoms  Abnormal pap/PCOS: -seeing her gynecologist for this, s/p LEEP  Hx of nephrolithiasis: -goes to [facility] when has a stone -has not had any issues in several years -hx lithotripsy  Chronic intermittent low back pain: -HEP given 6/16 -since pregnancy in 2008 -intermittent spasms in back, occur a few times per week -does not take anything, brief -denies: weakness, numbness, radiation to extremities, bowel or bladder incontinence   ROS: See pertinent positives and negatives per HPI.  Past Medical History  Diagnosis Date  . PCOS (polycystic ovarian syndrome)     sees gyn, hx mild insulin resistance as a teenager per her report  . Anxiety and depression     sees Dr. Clovis Pu  . Abnormal pap 06/20/07    ASCUS  . Kidney stones     sees [facility]  . Vaginal Pap smear, abnormal     HGSIL, s/p leep in 2016 with Dr. Leo Grosser  . Allergy   . Diverticulitis   . UTI (lower urinary tract infection)   . Migraines   . LGSIL (low grade squamous intraepithelial dysplasia) 12/21/2012    Noted at colposcopy.  Repeat pap and HPV 16/18 in 2015   . HGSIL (high grade squamous intraepithelial dysplasia)  03/16/2015    Noted on colposcopically directed biopsy.     Past Surgical History  Procedure Laterality Date  . Cesarean section    . Wisdom tooth extraction Bilateral   . Lithotripsy      2013  . Leep N/A 03/17/2015    Procedure: LOOP ELECTROSURGICAL EXCISION PROCEDURE (LEEP) and Cold Knife Conization;  Surgeon: Eldred Manges, MD;  Location: [facility];  Service: Gynecology;  Laterality: N/A;    Family History  Problem Relation Age of Onset  . Arthritis Mother   . Breast cancer Paternal Aunt   . Mental illness Father     History   Social History  . Marital Status: Married    Spouse Name: N/A  . Number of Children: N/A  . Years of Education: N/A   Social History Main Topics  . Smoking status: Former Smoker -- 0.25 packs/day    Types: Cigarettes    Quit date: 09/07/1998  . Smokeless tobacco: Never Used  . Alcohol Use: Yes     Comment: very occasionally  . Drug Use: No  . Sexual Activity: Yes    Birth Control/ Protection: Pill     Comment: microgestin   Other Topics Concern  . None   Social History Narrative   Works in Therapist, art   Married     Current outpatient prescriptions:  .  losartan (COZAAR) 50 MG tablet, Take 1.5 tablets (75 mg total) by mouth daily., Disp: 135 tablet, Rfl:  1 .  norethindrone (MICRONOR,CAMILA,ERRIN) 0.35 MG tablet, Take 1 tablet by mouth daily., Disp: , Rfl:  .  predniSONE (DELTASONE) 20 MG tablet, Take 2 tabs daily for 2 days, then take 1 tab daily for 5 days, Disp: 10 tablet, Rfl: 0 .  sertraline (ZOLOFT) 100 MG tablet, Take 150 mg by mouth daily. , Disp: , Rfl:  .  traZODone (DESYREL) 150 MG tablet, Take 150 mg by mouth at bedtime.  , Disp: , Rfl:   EXAM:  Filed Vitals:   05/02/15 0903  BP: 132/90  Pulse: 105  Temp: 98.1 F (36.7 C)    Body mass index is 49.17 kg/(m^2).  GENERAL: vitals reviewed and listed above, alert, oriented, appears well hydrated and in no acute distress  HEENT: atraumatic, conjunttiva clear, no  obvious abnormalities on inspection of external nose and ears  NECK: no obvious masses on inspection  LUNGS: clear to auscultation bilaterally, no wheezes, rales or rhonchi, good air movement  CV: HRRR, no peripheral edema  MS: moves all extremities without noticeable abnormality  PSYCH: pleasant and cooperative, no obvious depression or anxiety  ASSESSMENT AND PLAN:  Discussed the following assessment and plan:  Morbid obesity - Plan: Hemoglobin A1C, Lipid Panel  Essential hypertension - Plan: Basic metabolic panel, losartan (COZAAR) 50 MG tablet, DISCONTINUED: losartan (COZAAR) 50 MG tablet  Mood disorder - Anxiety and Depression  Low back pain without sciatica, unspecified back pain laterality  Acute bronchitis, unspecified organism  -FASTING labs - on prednisone for bronchitis -lifestyle recs -increase losartan to 10m daily -follow up in 3-4 months with CPE -Patient advised to return or notify a doctor immediately if symptoms worsen or persist or new concerns arise.  Patient Instructions  BEFORE YOU LEAVE: -labs -schedule physical in 3-4 months  Increase losartan to 75 mg dialy (1.5 tablets daily)  We recommend the following healthy lifestyle measures: - eat a healthy diet consisting of lots of vegetables, fruits, beans, nuts, seeds, healthy meats such as white chicken and fish and whole grains.  - avoid fried foods, fast food, processed foods, sodas, red meet and other fattening foods.  - get a least 150 minutes of aerobic exercise per week.         KColin BentonR.

## 2015-05-08 ENCOUNTER — Other Ambulatory Visit: Payer: Self-pay | Admitting: Orthopedic Surgery

## 2015-06-05 ENCOUNTER — Ambulatory Visit (HOSPITAL_BASED_OUTPATIENT_CLINIC_OR_DEPARTMENT_OTHER): Admit: 2015-06-05 | Payer: 59 | Admitting: Orthopedic Surgery

## 2015-06-05 ENCOUNTER — Encounter (HOSPITAL_BASED_OUTPATIENT_CLINIC_OR_DEPARTMENT_OTHER): Payer: Self-pay

## 2015-06-05 SURGERY — CARPAL TUNNEL RELEASE
Anesthesia: Choice | Laterality: Right

## 2015-08-06 ENCOUNTER — Encounter: Payer: Self-pay | Admitting: Family Medicine

## 2015-08-06 ENCOUNTER — Ambulatory Visit (INDEPENDENT_AMBULATORY_CARE_PROVIDER_SITE_OTHER): Payer: 59 | Admitting: Family Medicine

## 2015-08-06 DIAGNOSIS — I10 Essential (primary) hypertension: Secondary | ICD-10-CM

## 2015-08-06 DIAGNOSIS — E785 Hyperlipidemia, unspecified: Secondary | ICD-10-CM

## 2015-08-06 DIAGNOSIS — F39 Unspecified mood [affective] disorder: Secondary | ICD-10-CM | POA: Diagnosis not present

## 2015-08-06 DIAGNOSIS — Z23 Encounter for immunization: Secondary | ICD-10-CM | POA: Diagnosis not present

## 2015-08-06 DIAGNOSIS — R739 Hyperglycemia, unspecified: Secondary | ICD-10-CM | POA: Diagnosis not present

## 2015-08-06 LAB — LDL CHOLESTEROL, DIRECT: Direct LDL: 141 mg/dL

## 2015-08-06 LAB — LIPID PANEL
CHOL/HDL RATIO: 6
CHOLESTEROL: 247 mg/dL — AB (ref 0–200)
HDL: 42.9 mg/dL (ref >39.00)
NonHDL: 204.15
TRIGLYCERIDES: 320 mg/dL — AB (ref 0.0–149.0)
VLDL: 64 mg/dL — AB (ref 0.0–40.0)

## 2015-08-06 LAB — BASIC METABOLIC PANEL
BUN: 9 mg/dL (ref 6–23)
CALCIUM: 9.5 mg/dL (ref 8.4–10.5)
CO2: 30 mEq/L (ref 19–32)
Chloride: 101 mEq/L (ref 96–112)
Creatinine, Ser: 0.7 mg/dL (ref 0.40–1.20)
GFR: 98.86 mL/min (ref >60.00)
Glucose, Bld: 108 mg/dL — ABNORMAL HIGH (ref 70–99)
Potassium: 4.3 mEq/L (ref 3.5–5.1)
Sodium: 139 mEq/L (ref 135–145)

## 2015-08-06 LAB — HEMOGLOBIN A1C: Hgb A1c MFr Bld: 7 % — ABNORMAL HIGH (ref 4.6–6.5)

## 2015-08-06 NOTE — Progress Notes (Signed)
HPI:  Elevated BP: -started losartan 50 03/2015; increased to 25m last visit -denies: CP, SOB, DOE, swelling, HA changes -reports labs with prior PCP ok  HLD/Hyperglycemia/morbid obesity: -lifestyle recs and weight reduction advised -reports: trying to do some exercise for her back; no aerobic exercise; trying to work on diet -denies polyuria, pulydipsia, hx of statin use -reports chronic poor vision and sees eye doctor yearly  Depression and Anxiety: -[facility]- on trazadone and sertraline and reports is doing better now then she has in a long time - no hx of bipolar disorder, SI or hospitalization -no counseling  Hx of migraines and frequent headaches: -started when she was in college > 15 years ago -tend to be R sided temporal with nausea and vomiting occ, sometimes tension headaches -usually takes tylenol which works ok -no aura -has 1 headache every 1-2 months, now, were more frequent in the past  Hey fever, Allergies: -chronic -minor nasal symptoms  Abnormal pap/PCOS: -reports seeing her gynecologist for this, s/p LEEP  Hx of nephrolithiasis: -[facility] when has a stone -has not had any issues in several years -hx lithotripsy  Chronic intermittent low back pain: -HEP given 6/16 - reports much better with this without any spells since -since pregnancy in 2008 -denies: weakness, numbness, radiation to extremities, bowel or bladder incontinence    ROS: See pertinent positives and negatives per HPI.  Past Medical History  Diagnosis Date  . PCOS (polycystic ovarian syndrome)     sees gyn, hx mild insulin resistance as a teenager per her report  . Anxiety and depression     sees Dr. CClovis Pu . Abnormal pap 06/20/07    ASCUS  . Kidney stones     sees [facility]  . Vaginal Pap smear, abnormal     HGSIL, s/p leep in 2016 with Dr. HLeo Grosser . Allergy   . Diverticulitis   . UTI (lower urinary tract infection)   .  Migraines   . LGSIL (low grade squamous intraepithelial dysplasia) 12/21/2012    Noted at colposcopy.  Repeat pap and HPV 16/18 in 2015   . HGSIL (high grade squamous intraepithelial dysplasia) 03/16/2015    Noted on colposcopically directed biopsy.     Past Surgical History  Procedure Laterality Date  . Cesarean section    . Wisdom tooth extraction Bilateral   . Lithotripsy      2013  . Leep N/A 03/17/2015    Procedure: LOOP ELECTROSURGICAL EXCISION PROCEDURE (LEEP) and Cold Knife Conization;  Surgeon: VEldred Manges MD;  [facility];  Service: Gynecology;  Laterality: N/A;    Family History  Problem Relation Age of Onset  . Arthritis Mother   . Breast cancer Paternal Aunt   . Mental illness Father     Social History   Social History  . Marital Status: Married    Spouse Name: N/A  . Number of Children: N/A  . Years of Education: N/A   Social History Main Topics  . Smoking status: Former Smoker -- 0.25 packs/day    Types: Cigarettes    Quit date: 09/07/1998  . Smokeless tobacco: Never Used  . Alcohol Use: Yes     Comment: very occasionally  . Drug Use: No  . Sexual Activity: Yes    Birth Control/ Protection: Pill     Comment: microgestin   Other Topics Concern  . 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  losartan (COZAAR) 50 MG tablet, Take 1.5 tablets (75 mg total) by mouth daily., Disp: 135 tablet, Rfl: 1 .  norethindrone (MICRONOR,CAMILA,ERRIN) 0.35 MG tablet, Take 1 tablet by mouth daily., Disp: , Rfl:  .  sertraline (ZOLOFT) 100 MG tablet, Take 150 mg by mouth daily. , Disp: , Rfl:  .  traZODone (DESYREL) 150 MG tablet, Take 150 mg by mouth at bedtime.  , Disp: , Rfl:   EXAM:  Filed Vitals:   08/06/15 0951  BP: 124/86  Pulse: 104  Temp: 97.7 F (36.5 C)    Body mass index is 51.04 kg/(m^2).  GENERAL: vitals reviewed and listed above, alert, oriented, appears well  hydrated and in no acute distress  HEENT: atraumatic, conjunttiva clear, no obvious abnormalities on inspection of external nose and ears  NECK: no obvious masses on inspection  LUNGS: clear to auscultation bilaterally, no wheezes, rales or rhonchi, good air movement  CV: HRRR, no peripheral edema  MS: moves all extremities without noticeable abnormality  PSYCH: pleasant and cooperative, no obvious depression or anxiety  ASSESSMENT AND PLAN:  Discussed the following assessment and plan:  Morbid obesity, unspecified obesity type ([place])  Mood disorder - Anxiety and Depression  Hyperlipidemia - Plan: Lipid Panel  Hyperglycemia - Plan: Hemoglobin A1c  Essential hypertension - Plan: Basic metabolic panel  -stressed importance of healthy diet and regular aerobic exercise -continue losartan -recheck labs today and if [id number] still up referral to diabetes educator and addition of statin discussed along with risks and implications of disease versus treatment -follow up in 3 months -Patient advised to return or notify a doctor immediately if symptoms worsen or persist or new concerns arise.  Patient Instructions  BEFORE YOU LEAVE: -labs -flu shot -follow up in 3-4 months  -We have ordered labs or studies at this visit. It can take up to 1-2 weeks for results and processing. We will contact you with instructions IF your results are abnormal. Normal results will be released to your [facility]. If you have not heard from [place] or can not find your results in [facility] in 2 weeks please contact our office.  We recommend the following healthy lifestyle measures: - eat a healthy whole foods diet consisting of regular small meals composed of vegetables, fruits, beans, nuts, seeds, healthy meats such as white chicken and fish and whole grains.  - avoid sweets, white starchy foods, fried foods, fast food, processed foods, sodas, red meet and other fattening foods.  - get a least 150-300 minutes of  aerobic exercise per week.            Colin Benton R.

## 2015-08-06 NOTE — Progress Notes (Signed)
Pre visit review using our clinic review tool, if applicable. No additional management support is needed unless otherwise documented below in the visit note. 

## 2015-08-06 NOTE — Patient Instructions (Signed)
BEFORE YOU LEAVE: -labs -flu shot -follow up in 3-4 months  -We have ordered labs or studies at this visit. It can take up to 1-2 weeks for results and processing. We will contact you with instructions IF your results are abnormal. Normal results will be released to your [facility]. If you have not heard from [place] or can not find your results in [facility] in 2 weeks please contact our office.  We recommend the following healthy lifestyle measures: - eat a healthy whole foods diet consisting of regular small meals composed of vegetables, fruits, beans, nuts, seeds, healthy meats such as white chicken and fish and whole grains.  - avoid sweets, white starchy foods, fried foods, fast food, processed foods, sodas, red meet and other fattening foods.  - get a least 150-300 minutes of aerobic exercise per week.

## 2015-08-12 MED ORDER — METFORMIN HCL 500 MG PO TABS: ORAL_TABLET | ORAL | Status: DC

## 2015-08-12 MED ORDER — ROSUVASTATIN CALCIUM 10 MG PO TABS: 10.0000 mg | ORAL_TABLET | Freq: Every day | ORAL | Status: DC

## 2015-08-12 NOTE — Addendum Note (Signed)
Addended by: Agnes Lawrence on: 08/12/2015 02:40 PM   Modules accepted: Orders

## 2015-08-18 ENCOUNTER — Other Ambulatory Visit: Payer: Self-pay | Admitting: *Deleted

## 2015-08-18 MED ORDER — ROSUVASTATIN CALCIUM 10 MG PO TABS: 10.0000 mg | ORAL_TABLET | Freq: Every day | ORAL | Status: DC

## 2015-08-18 MED ORDER — METFORMIN HCL 500 MG PO TABS: ORAL_TABLET | ORAL | Status: DC

## 2015-09-09 ENCOUNTER — Encounter (HOSPITAL_BASED_OUTPATIENT_CLINIC_OR_DEPARTMENT_OTHER): Payer: Self-pay | Admitting: *Deleted

## 2015-09-09 NOTE — Pre-Procedure Instructions (Signed)
To come for BMET, EKG and anesthesia airway evaluation

## 2015-09-11 ENCOUNTER — Other Ambulatory Visit: Payer: Self-pay | Admitting: Orthopedic Surgery

## 2015-09-15 ENCOUNTER — Encounter: Payer: Self-pay | Admitting: Dietician

## 2015-09-15 ENCOUNTER — Encounter: Payer: 59 | Attending: Family Medicine | Admitting: Dietician

## 2015-09-15 ENCOUNTER — Encounter (HOSPITAL_BASED_OUTPATIENT_CLINIC_OR_DEPARTMENT_OTHER)
Admission: RE | Admit: 2015-09-15 | Discharge: 2015-09-15 | Disposition: A | Discharge status: Home / Self Care | Payer: 59 | Source: Ambulatory Visit | Attending: Orthopedic Surgery | Admitting: Orthopedic Surgery

## 2015-09-15 DIAGNOSIS — Z87891 Personal history of nicotine dependence: Secondary | ICD-10-CM | POA: Diagnosis not present

## 2015-09-15 DIAGNOSIS — G8929 Other chronic pain: Secondary | ICD-10-CM | POA: Diagnosis not present

## 2015-09-15 DIAGNOSIS — F418 Other specified anxiety disorders: Secondary | ICD-10-CM | POA: Diagnosis not present

## 2015-09-15 DIAGNOSIS — E119 Type 2 diabetes mellitus without complications: Secondary | ICD-10-CM | POA: Diagnosis not present

## 2015-09-15 DIAGNOSIS — I10 Essential (primary) hypertension: Secondary | ICD-10-CM | POA: Diagnosis not present

## 2015-09-15 DIAGNOSIS — Z713 Dietary counseling and surveillance: Secondary | ICD-10-CM | POA: Insufficient documentation

## 2015-09-15 DIAGNOSIS — E282 Polycystic ovarian syndrome: Secondary | ICD-10-CM | POA: Insufficient documentation

## 2015-09-15 DIAGNOSIS — G5601 Carpal tunnel syndrome, right upper limb: Secondary | ICD-10-CM | POA: Diagnosis not present

## 2015-09-15 DIAGNOSIS — Z79899 Other long term (current) drug therapy: Secondary | ICD-10-CM | POA: Diagnosis not present

## 2015-09-15 DIAGNOSIS — M545 Low back pain: Secondary | ICD-10-CM | POA: Diagnosis not present

## 2015-09-15 DIAGNOSIS — Z6841 Body Mass Index (BMI) 40.0 and over, adult: Secondary | ICD-10-CM | POA: Insufficient documentation

## 2015-09-15 LAB — BASIC METABOLIC PANEL
Anion gap: 8 (ref 5–15)
BUN: 8 mg/dL (ref 6–20)
CALCIUM: 9.2 mg/dL (ref 8.9–10.3)
CHLORIDE: 102 mmol/L (ref 101–111)
CO2: 25 mmol/L (ref 22–32)
CREATININE: 0.75 mg/dL (ref 0.44–1.00)
Glucose, Bld: 108 mg/dL — ABNORMAL HIGH (ref 65–99)
Potassium: 4.5 mmol/L (ref 3.5–5.1)
SODIUM: 135 mmol/L (ref 135–145)

## 2015-09-15 NOTE — Patient Instructions (Addendum)
Aim for 7-8 hours sleep each night.  Move bedtime back 15 minutes each week until goal is reached. Be a mindful eater. Evening snack time- Am I eating because I am hungry or for other reasons? Eat slowly. Whole grains rather than white. Aim for 1-2 Carb Choices per meal (30-45 grams) +/- 1 either way  Aim for 0-1 Carbs per snack if hungry  Include protein in moderation with your meals and snacks Consider reading food labels for Total Carbohydrate and Fat Grams of foods Consider  increasing your activity level by walking 30-60 minutes daily as tolerated  Omega-3 (1500-4 grams) Inositol 1.2-4 grams per day (can help insulin resistance, lipids, androgen lowering, HTN, weight loss) Make sure to get adequate vitamin D

## 2015-09-15 NOTE — Progress Notes (Signed)
Diabetes Self-Management Education  Visit Type: First/Initial  Appt. Start Time: 0815 Appt. End Time: 0930  09/15/2015  [name], identified by name and date of birth, is a 39 y.o. female with a diagnosis of Diabetes: Type 2.  Other hx includes PCOS, hyperlipidemia, HTN, Extreme obesity with BMI of 51.    Labs: Cholesterol 247, Triglycerides 320, HDL 43, LDL 141 Sleep:  About 6 hours per night Weight hx:  Highest weight is current weight.  Lowest weight 165 lbs after college.  PCOS since age 80.    Patient lives with her husband and 78 yo daughter.  Husband works 11 am-8 pm.  Patient does the shopping and cooking.    ASSESSMENT  Height 5' (1.524 m), weight 261 lb (118.389 kg), last menstrual period 07/28/2015. Body mass index is 50.97 kg/(m^2).      Diabetes Self-Management Education - 09/15/15 0817    Visit Information   Visit Type First/Initial   Initial Visit   Diabetes Type Type 2   Are you currently following a meal plan? No   Are you taking your medications as prescribed? Yes   Date Diagnosed 07/2015   Health Coping   How would you rate your overall health? Fair   Psychosocial Assessment   Patient Belief/Attitude about Diabetes Motivated to manage diabetes   Self-care barriers None   Self-management support Doctor's office;Family   Other persons present Patient   Patient Concerns Nutrition/Meal planning;Weight Control   Special Needs None   Preferred Learning Style No preference indicated   Learning Readiness Ready   How often do you need to have someone help you when you read instructions, pamphlets, or other written materials from your doctor or pharmacy? 1 - Never   What is the last grade level you completed in school? BA   Complications   Last HgB A1C per patient/outside source 7 %  08/06/15   How often do you check your blood sugar? Not recommended by provider   Have you had a dilated eye exam in the past 12 months? Yes   Have you had a dental  exam in the past 12 months? No   Are you checking your feet? Yes   How many days per week are you checking your feet? 7   Dietary Intake   Breakfast Rice Krispies or Honey Bunches of Oats or plain cheereos and 2% milk OR eggs, toast, sausage on weekends  9:15   Snack (morning) none   Lunch leftovers or waits until 1:30  11:15   Snack (afternoon) leftovers if hasn't eaten lunch early, occasional cheese stick  1:30   Dinner crock pot meal or soup with bread or rotissorie chicken, mashed potatoes, vegetables  6:30 OR 8:30 weekday    Snack (evening) popcorn, nuts, milk   Beverage(s) diet soda, 2% miolk, small amounts water, flavored water   Exercise   Exercise Type ADL's   Patient Education   Previous Diabetes Education No   Disease state  Definition of diabetes, type 1 and 2, and the diagnosis of diabetes;Explored patient's options for treatment of their diabetes   Nutrition management  Role of diet in the treatment of diabetes and the relationship between the three main macronutrients and blood glucose level;Food label reading, portion sizes and measuring food.;Meal options for control of blood glucose level and chronic complications.;Meal timing in regards to the patients' current diabetes medication.   Physical activity and exercise  Role of exercise on diabetes management, blood pressure control and cardiac  health.   Monitoring Identified appropriate SMBG and/or A1C goals.   Chronic complications Relationship between chronic complications and blood glucose control;Dental care;Retinopathy and reason for yearly dilated eye exams   Psychosocial adjustment Role of stress on diabetes   Individualized Goals (developed by patient)   Nutrition General guidelines for healthy choices and portions discussed;Follow meal plan discussed   Physical Activity Exercise 5-7 days per week;30 minutes per day   Medications take my medication as prescribed   Monitoring  Other (comment)  Patient to discuss  with MD and f/u with training with [place] as needed.   Reducing Risk Not Applicable   Outcomes   Expected Outcomes Demonstrated interest in learning. Expect positive outcomes   Future DMSE 4-6 wks   Program Status Completed      Individualized Plan for Diabetes Self-Management Training:   Learning Objective:  Patient will have a greater understanding of diabetes self-management. Patient education plan is to attend individual and/or group sessions per assessed needs and concerns. Also discussed diet in relationship to PCOS and hyperlipidemia and mindful eating for normalization of weight.    Plan:   Patient Instructions  Aim for 7-8 hours sleep each night.  Move bedtime back 15 minutes each week until goal is reached. Be a mindful eater. Evening snack time- Am I eating because I am hungry or for other reasons? Eat slowly. Whole grains rather than white. Aim for 1-2 Carb Choices per meal (30-45 grams) +/- 1 either way  Aim for 0-1 Carbs per snack if hungry  Include protein in moderation with your meals and snacks Consider reading food labels for Total Carbohydrate and Fat Grams of foods Consider  increasing your activity level by walking 30-60 minutes daily as tolerated  Omega-3 (1500-4 grams) Inositol 1.2-4 grams per day (can help insulin resistance, lipids, androgen lowering, HTN, weight loss) Make sure to get adequate vitamin D    Expected Outcomes:  Demonstrated interest in learning. Expect positive outcomes  Education material provided: Living Well with Diabetes, Food label handouts, A1C conversion sheet, Meal plan card, My Plate and Snack sheet, Breakfast ideas  If problems or questions, patient to contact team via:  Phone and Email  Future DSME appointment: 4-6 wks

## 2015-09-16 ENCOUNTER — Encounter (HOSPITAL_BASED_OUTPATIENT_CLINIC_OR_DEPARTMENT_OTHER): Payer: Self-pay

## 2015-09-16 ENCOUNTER — Ambulatory Visit (HOSPITAL_BASED_OUTPATIENT_CLINIC_OR_DEPARTMENT_OTHER): Payer: 59 | Admitting: Certified Registered"

## 2015-09-16 ENCOUNTER — Encounter (HOSPITAL_BASED_OUTPATIENT_CLINIC_OR_DEPARTMENT_OTHER)
Admission: RE | Disposition: A | Discharge status: Home / Self Care | Payer: Self-pay | Source: Ambulatory Visit | Attending: Orthopedic Surgery

## 2015-09-16 ENCOUNTER — Ambulatory Visit (HOSPITAL_BASED_OUTPATIENT_CLINIC_OR_DEPARTMENT_OTHER)
Admission: RE | Admit: 2015-09-16 | Discharge: 2015-09-16 | Disposition: A | Discharge status: Home / Self Care | Payer: 59 | Source: Ambulatory Visit | Attending: Orthopedic Surgery | Admitting: Orthopedic Surgery

## 2015-09-16 DIAGNOSIS — Z79899 Other long term (current) drug therapy: Secondary | ICD-10-CM | POA: Insufficient documentation

## 2015-09-16 DIAGNOSIS — G5601 Carpal tunnel syndrome, right upper limb: Secondary | ICD-10-CM | POA: Diagnosis not present

## 2015-09-16 DIAGNOSIS — E119 Type 2 diabetes mellitus without complications: Secondary | ICD-10-CM | POA: Insufficient documentation

## 2015-09-16 DIAGNOSIS — G8929 Other chronic pain: Secondary | ICD-10-CM | POA: Insufficient documentation

## 2015-09-16 DIAGNOSIS — M545 Low back pain: Secondary | ICD-10-CM | POA: Insufficient documentation

## 2015-09-16 DIAGNOSIS — F418 Other specified anxiety disorders: Secondary | ICD-10-CM | POA: Insufficient documentation

## 2015-09-16 DIAGNOSIS — Z87891 Personal history of nicotine dependence: Secondary | ICD-10-CM | POA: Insufficient documentation

## 2015-09-16 DIAGNOSIS — I10 Essential (primary) hypertension: Secondary | ICD-10-CM | POA: Insufficient documentation

## 2015-09-16 HISTORY — DX: Other chronic pain: G89.29

## 2015-09-16 HISTORY — DX: Chronic tension-type headache, not intractable: G44.229

## 2015-09-16 HISTORY — DX: Type 2 diabetes mellitus without complications: E11.9

## 2015-09-16 HISTORY — DX: Personal history of urinary calculi: Z87.442

## 2015-09-16 HISTORY — PX: CARPAL TUNNEL RELEASE: SHX101

## 2015-09-16 HISTORY — DX: Low back pain, unspecified: M54.50

## 2015-09-16 HISTORY — DX: Major depressive disorder, single episode, unspecified: F32.9

## 2015-09-16 HISTORY — DX: Essential (primary) hypertension: I10

## 2015-09-16 HISTORY — DX: Low back pain: M54.5

## 2015-09-16 HISTORY — DX: Depression, unspecified: F32.A

## 2015-09-16 LAB — GLUCOSE, CAPILLARY
GLUCOSE-CAPILLARY: 88 mg/dL (ref 65–99)
GLUCOSE-CAPILLARY: 90 mg/dL (ref 65–99)

## 2015-09-16 SURGERY — CARPAL TUNNEL RELEASE
Anesthesia: General | Site: Wrist | Laterality: Right

## 2015-09-16 MED ORDER — OXYCODONE HCL 5 MG/5ML PO SOLN: 5.0000 mg | Freq: Once | ORAL | Status: DC | PRN

## 2015-09-16 MED ORDER — ONDANSETRON HCL 4 MG/2ML IJ SOLN
INTRAMUSCULAR | Status: DC | PRN
  Administered 2015-09-16: 4 mg via INTRAVENOUS

## 2015-09-16 MED ORDER — FENTANYL CITRATE (PF) 100 MCG/2ML IJ SOLN
INTRAMUSCULAR | Status: AC
  Filled 2015-09-16: qty 2

## 2015-09-16 MED ORDER — DEXAMETHASONE SODIUM PHOSPHATE 10 MG/ML IJ SOLN
INTRAMUSCULAR | Status: AC
  Filled 2015-09-16: qty 1

## 2015-09-16 MED ORDER — MEPERIDINE HCL 25 MG/ML IJ SOLN: 6.2500 mg | INTRAMUSCULAR | Status: DC | PRN

## 2015-09-16 MED ORDER — PHENYLEPHRINE 40 MCG/ML (10ML) SYRINGE FOR IV PUSH (FOR BLOOD PRESSURE SUPPORT)
PREFILLED_SYRINGE | INTRAVENOUS | Status: AC
  Filled 2015-09-16: qty 10

## 2015-09-16 MED ORDER — CEFAZOLIN SODIUM-DEXTROSE 2-3 GM-% IV SOLR
2.0000 g | INTRAVENOUS | Status: AC
  Administered 2015-09-16: 2 g via INTRAVENOUS

## 2015-09-16 MED ORDER — MIDAZOLAM HCL 2 MG/2ML IJ SOLN
1.0000 mg | INTRAMUSCULAR | Status: DC | PRN
  Administered 2015-09-16: 2 mg via INTRAVENOUS

## 2015-09-16 MED ORDER — HYDROMORPHONE HCL 1 MG/ML IJ SOLN
INTRAMUSCULAR | Status: AC
  Filled 2015-09-16: qty 1

## 2015-09-16 MED ORDER — DEXAMETHASONE SODIUM PHOSPHATE 10 MG/ML IJ SOLN
INTRAMUSCULAR | Status: DC | PRN
  Administered 2015-09-16: 10 mg via INTRAVENOUS

## 2015-09-16 MED ORDER — ONDANSETRON HCL 4 MG/2ML IJ SOLN
INTRAMUSCULAR | Status: AC
  Filled 2015-09-16: qty 2

## 2015-09-16 MED ORDER — CHLORHEXIDINE GLUCONATE 4 % EX LIQD: 60.0000 mL | Freq: Once | CUTANEOUS | Status: DC

## 2015-09-16 MED ORDER — OXYCODONE HCL 5 MG PO TABS: 5.0000 mg | ORAL_TABLET | Freq: Once | ORAL | Status: DC | PRN

## 2015-09-16 MED ORDER — FENTANYL CITRATE (PF) 100 MCG/2ML IJ SOLN
50.0000 ug | INTRAMUSCULAR | Status: DC | PRN
  Administered 2015-09-16: 100 ug via INTRAVENOUS
  Administered 2015-09-16: 50 ug via INTRAVENOUS

## 2015-09-16 MED ORDER — HYDROCODONE-ACETAMINOPHEN 5-325 MG PO TABS: ORAL_TABLET | ORAL | Status: DC

## 2015-09-16 MED ORDER — MIDAZOLAM HCL 2 MG/2ML IJ SOLN
INTRAMUSCULAR | Status: AC
  Filled 2015-09-16: qty 2

## 2015-09-16 MED ORDER — LIDOCAINE HCL (CARDIAC) 20 MG/ML IV SOLN
INTRAVENOUS | Status: DC | PRN
  Administered 2015-09-16: 60 mg via INTRAVENOUS

## 2015-09-16 MED ORDER — BUPIVACAINE HCL (PF) 0.25 % IJ SOLN
INTRAMUSCULAR | Status: DC | PRN
Start: 2015-09-16 — End: 2015-09-16
  Administered 2015-09-16: 10 mL

## 2015-09-16 MED ORDER — PROPOFOL 10 MG/ML IV BOLUS
INTRAVENOUS | Status: DC | PRN
  Administered 2015-09-16: 200 mg via INTRAVENOUS

## 2015-09-16 MED ORDER — HYDROMORPHONE HCL 1 MG/ML IJ SOLN
0.2500 mg | INTRAMUSCULAR | Status: DC | PRN
  Administered 2015-09-16 (×2): 0.5 mg via INTRAVENOUS

## 2015-09-16 MED ORDER — LIDOCAINE HCL (CARDIAC) 20 MG/ML IV SOLN
INTRAVENOUS | Status: AC
  Filled 2015-09-16: qty 5

## 2015-09-16 MED ORDER — SCOPOLAMINE 1 MG/3DAYS TD PT72: 1.0000 | MEDICATED_PATCH | Freq: Once | TRANSDERMAL | Status: DC | PRN

## 2015-09-16 MED ORDER — 0.9 % SODIUM CHLORIDE (POUR BTL) OPTIME
TOPICAL | Status: DC | PRN
  Administered 2015-09-16: 100 mL

## 2015-09-16 MED ORDER — GLYCOPYRROLATE 0.2 MG/ML IJ SOLN: 0.2000 mg | Freq: Once | INTRAMUSCULAR | Status: DC | PRN

## 2015-09-16 MED ORDER — PHENYLEPHRINE HCL 10 MG/ML IJ SOLN
INTRAMUSCULAR | Status: DC | PRN
  Administered 2015-09-16 (×2): 40 ug via INTRAVENOUS

## 2015-09-16 MED ORDER — LACTATED RINGERS IV SOLN
INTRAVENOUS | Status: DC
  Administered 2015-09-16 (×2): via INTRAVENOUS

## 2015-09-16 SURGICAL SUPPLY — 38 items
BANDAGE ELASTIC 3 VELCRO ST LF (GAUZE/BANDAGES/DRESSINGS) ×3 IMPLANT
BLADE SURG 15 STRL LF DISP TIS (BLADE) ×2 IMPLANT
BLADE SURG 15 STRL SS (BLADE) ×4
BNDG ESMARK 4X9 LF (GAUZE/BANDAGES/DRESSINGS) ×3 IMPLANT
BNDG GAUZE ELAST 4 BULKY (GAUZE/BANDAGES/DRESSINGS) ×3 IMPLANT
CHLORAPREP W/TINT 26ML (MISCELLANEOUS) ×3 IMPLANT
CORDS BIPOLAR (ELECTRODE) ×3 IMPLANT
COVER BACK TABLE 60X90IN (DRAPES) ×3 IMPLANT
COVER MAYO STAND STRL (DRAPES) ×3 IMPLANT
CUFF TOURNIQUET SINGLE 18IN (TOURNIQUET CUFF) IMPLANT
CUFF TOURNIQUET SINGLE 24IN (TOURNIQUET CUFF) ×3 IMPLANT
DRAPE EXTREMITY T 121X128X90 (DRAPE) ×3 IMPLANT
DRAPE SURG 17X23 STRL (DRAPES) ×3 IMPLANT
DRSG PAD ABDOMINAL 8X10 ST (GAUZE/BANDAGES/DRESSINGS) ×3 IMPLANT
GAUZE SPONGE 4X4 12PLY STRL (GAUZE/BANDAGES/DRESSINGS) ×3 IMPLANT
GAUZE XEROFORM 1X8 LF (GAUZE/BANDAGES/DRESSINGS) ×3 IMPLANT
GLOVE BIO SURGEON STRL SZ 6.5 (GLOVE) ×2 IMPLANT
GLOVE BIO SURGEON STRL SZ7.5 (GLOVE) ×3 IMPLANT
GLOVE BIO SURGEONS STRL SZ 6.5 (GLOVE) ×1
GLOVE BIOGEL PI IND STRL 7.0 (GLOVE) ×2 IMPLANT
GLOVE BIOGEL PI IND STRL 8 (GLOVE) ×2 IMPLANT
GLOVE BIOGEL PI INDICATOR 7.0 (GLOVE) ×4
GLOVE BIOGEL PI INDICATOR 8 (GLOVE) ×4
GLOVE SURG SS PI 7.5 STRL IVOR (GLOVE) ×3 IMPLANT
GOWN STRL REUS W/ TWL LRG LVL3 (GOWN DISPOSABLE) ×1 IMPLANT
GOWN STRL REUS W/TWL LRG LVL3 (GOWN DISPOSABLE) ×2
GOWN STRL REUS W/TWL XL LVL3 (GOWN DISPOSABLE) ×6 IMPLANT
NEEDLE HYPO 25X1 1.5 SAFETY (NEEDLE) ×3 IMPLANT
NS IRRIG 1000ML POUR BTL (IV SOLUTION) ×3 IMPLANT
PACK BASIN DAY SURGERY FS (CUSTOM PROCEDURE TRAY) ×3 IMPLANT
PADDING CAST ABS 4INX4YD NS (CAST SUPPLIES) ×2
PADDING CAST ABS COTTON 4X4 ST (CAST SUPPLIES) ×1 IMPLANT
STOCKINETTE 4X48 STRL (DRAPES) ×3 IMPLANT
SUT ETHILON 4 0 PS 2 18 (SUTURE) ×3 IMPLANT
SYR BULB 3OZ (MISCELLANEOUS) ×3 IMPLANT
SYR CONTROL 10ML LL (SYRINGE) ×3 IMPLANT
TOWEL OR 17X24 6PK STRL BLUE (TOWEL DISPOSABLE) ×6 IMPLANT
UNDERPAD 30X30 (UNDERPADS AND DIAPERS) ×3 IMPLANT

## 2015-09-16 NOTE — Anesthesia Procedure Notes (Signed)
Procedure Name: LMA Insertion Date/Time: 09/16/2015 2:44 PM Performed by: Shemica Meath D Pre-anesthesia Checklist: Patient identified, Emergency Drugs available, Suction available and Patient being monitored Patient Re-evaluated:Patient Re-evaluated prior to inductionOxygen Delivery Method: Circle System Utilized Preoxygenation: Pre-oxygenation with 100% oxygen Intubation Type: IV induction Ventilation: Mask ventilation without difficulty LMA: LMA inserted LMA Size: 4.0 Number of attempts: 1 Airway Equipment and Method: Bite block Placement Confirmation: positive ETCO2 Tube secured with: Tape Dental Injury: Teeth and Oropharynx as per pre-operative assessment

## 2015-09-16 NOTE — Anesthesia Preprocedure Evaluation (Addendum)
Anesthesia Evaluation  Patient identified by MRN, date of birth, ID band Patient awake    Reviewed: Allergy & Precautions, NPO status , Patient's Chart, lab work & pertinent test results  Airway Mallampati: I  TM Distance: >3 FB Neck ROM: Full    Dental  (+) Teeth Intact, Dental Advisory Given   Pulmonary former smoker,    breath sounds clear to auscultation       Cardiovascular hypertension, Pt. on medications  Rhythm:Regular Rate:Normal     Neuro/Psych PSYCHIATRIC DISORDERS Anxiety Depression    GI/Hepatic   Endo/Other  diabetes, Well Controlled, Type 2, Oral Hypoglycemic AgentsMorbid obesity  Renal/GU      Musculoskeletal   Abdominal   Peds  Hematology   Anesthesia Other Findings   Reproductive/Obstetrics                            Anesthesia Physical Anesthesia Plan  ASA: III  Anesthesia Plan: General   Post-op Pain Management:    Induction: Intravenous  Airway Management Planned: LMA  Additional Equipment:   Intra-op Plan:   Post-operative Plan: Extubation in OR  Informed Consent: I have reviewed the patients History and Physical, chart, labs and discussed the procedure including the risks, benefits and alternatives for the proposed anesthesia with the patient or authorized representative who has indicated his/her understanding and acceptance.   Dental advisory given  Plan Discussed with: CRNA, Anesthesiologist and Surgeon  Anesthesia Plan Comments:         Anesthesia Quick Evaluation

## 2015-09-16 NOTE — Anesthesia Postprocedure Evaluation (Signed)
Anesthesia Post Note  Patient: [name]  Procedure(s) Performed: Procedure(s) (LRB): RIGHT CARPAL TUNNEL RELEASE (Right)  Patient location during evaluation: PACU Anesthesia Type: General Level of consciousness: awake and alert Pain management: pain level controlled Vital Signs Assessment: post-procedure vital signs reviewed and stable Respiratory status: spontaneous breathing, nonlabored ventilation, respiratory function stable and patient connected to nasal cannula oxygen Cardiovascular status: blood pressure returned to baseline and stable Postop Assessment: No signs of nausea or vomiting Anesthetic complications: no    Last Vitals:  Filed Vitals:   09/16/15 1518 09/16/15 1530  BP: 121/57 114/73  Pulse: 90 92  Temp:    Resp: 20 15    Last Pain:  Filed Vitals:   09/16/15 1532  PainSc: 4                  Marlita Keil A

## 2015-09-16 NOTE — H&P (Signed)
[name] is an 39 y.o. female.   Chief Complaint: carpal tunnel syndrome HPI: 39 yo rhd female with years of carpal tunnel syndrome.  Positive nerve conduction studies.  She wishes to have carpal tunnel release.  Past Medical History  Diagnosis Date  . Migraines   . Tension headache, chronic   . Anxiety   . Depression   . Non-insulin dependent type 2 diabetes mellitus ([place])   . Carpal tunnel syndrome of right wrist 08/2015  . Hypertension     states under control with med., has been on med. < 6 mos.  . History of kidney stones   . Chronic low back pain     Past Surgical History  Procedure Laterality Date  . Cesarean section  05/06/2007  . Wisdom tooth extraction    . Leep N/A 03/17/2015    Procedure: LOOP ELECTROSURGICAL EXCISION PROCEDURE (LEEP) and Cold Knife Conization;  Surgeon: Eldred Manges, MD;  Location: [facility];  Service: Gynecology;  Laterality: N/A;  . Extracorporeal shock wave lithotripsy  11/22/2011    Family History  Problem Relation Age of Onset  . Arthritis Mother   . Breast cancer Paternal Aunt   . Mental illness Father    Social History:  reports that she quit smoking about 17 years ago. She has never used smokeless tobacco. She reports that she drinks alcohol. She reports that she does not use illicit drugs.  Allergies: No Known Allergies  Medications Prior to Admission  Medication Sig Dispense Refill  . acetaminophen (TYLENOL) 325 MG tablet Take 650 mg by mouth every 6 (six) hours as needed.    Marland Kitchen losartan (COZAAR) 50 MG tablet Take 1.5 tablets (75 mg total) by mouth daily. 135 tablet 1  . metFORMIN (GLUCOPHAGE) 500 MG tablet Take 500 mg by mouth 2 (two) times daily with a meal.    . norethindrone (HEATHER) 0.35 MG tablet Take 1 tablet by mouth daily.    . rosuvastatin (CRESTOR) 10 MG tablet Take 1 tablet (10 mg total) by mouth daily. 90 tablet 1  . sertraline (ZOLOFT) 100 MG tablet Take 150 mg by mouth daily.     . traZODone (DESYREL) 150 MG  tablet Take 150 mg by mouth at bedtime.        Results for orders placed or performed during the hospital encounter of 09/16/15 (from the past 48 hour(s))  Glucose, capillary     Status: None   Collection Time: 09/16/15  1:11 PM  Result Value Ref Range   Glucose-Capillary 88 65 - 99 mg/dL    No results found.   A comprehensive review of systems was negative except for: Eyes: positive for contacts/glasses Genitourinary: positive for dysuria Neurological: positive for headaches Behavioral/Psych: positive for anxiety  Blood pressure 129/77, pulse 99, temperature 98.5 F (36.9 C), temperature source Oral, resp. rate 20, height 5' (1.524 m), weight 118.389 kg (261 lb), last menstrual period 07/28/2015, SpO2 96 %.  General appearance: alert, cooperative and appears stated age Head: Normocephalic, without obvious abnormality, atraumatic Neck: supple, symmetrical, trachea midline Resp: clear to auscultation bilaterally Cardio: regular rate and rhythm GI: non tender Extremities: intact sensation and capillary refill all digits.  +epl/fpl/io.  no wounds. Pulses: 2+ and symmetric Skin: Skin color, texture, turgor normal. No rashes or lesions Neurologic: Grossly normal Incision/Wound: none  Assessment/Plan Carpal tunnel syndrome.  She wishes to have right carpal tunnel release.  Non operative and operative treatment options were discussed with the patient and patient wishes to  proceed with operative treatment. Risks, benefits, and alternatives of surgery were discussed and the patient agrees with the plan of care.    Meghan Tiemann R 09/16/2015, 1:29 PM

## 2015-09-16 NOTE — Brief Op Note (Signed)
09/16/2015  3:08 PM  PATIENT:  [name]  39 y.o. female  PRE-OPERATIVE DIAGNOSIS:  RIGHT CARPAL TUNNEL SYNDROME   POST-OPERATIVE DIAGNOSIS:  RIGHT CARPAL TUNNEL SYNDROME   PROCEDURE:  Procedure(s): RIGHT CARPAL TUNNEL RELEASE (Right)  SURGEON:  Surgeon(s) and Role:    * Leanora Cover, MD - Primary  PHYSICIAN ASSISTANT:   ASSISTANTS: none   ANESTHESIA:   general  EBL:  Total I/O In: 1000 [I.V.:1000] Out: -   BLOOD ADMINISTERED:none  DRAINS: none   LOCAL MEDICATIONS USED:  MARCAINE     SPECIMEN:  No Specimen  DISPOSITION OF SPECIMEN:  N/A  COUNTS:  YES  TOURNIQUET:   Total Tourniquet Time Documented: Upper Arm (Right) - 12 minutes Total: Upper Arm (Right) - 12 minutes   DICTATION: .Other Dictation: Dictation Number [id number]  PLAN OF CARE: Discharge to home after PACU  PATIENT DISPOSITION:  PACU - hemodynamically stable.

## 2015-09-16 NOTE — Transfer of Care (Signed)
Immediate Anesthesia Transfer of Care Note  Patient: [name]  Procedure(s) Performed: Procedure(s): RIGHT CARPAL TUNNEL RELEASE (Right)  Patient Location: PACU  Anesthesia Type:General  Level of Consciousness: awake, alert , oriented and patient cooperative  Airway & Oxygen Therapy: Patient Spontanous Breathing and Patient connected to face mask oxygen  Post-op Assessment: Report given to RN and Post -op Vital signs reviewed and stable  Post vital signs: Reviewed and stable  Last Vitals:  Filed Vitals:   09/16/15 1518 09/16/15 1530  BP: 121/57 114/73  Pulse: 90 92  Temp:    Resp: 20 15    Complications: No apparent anesthesia complications

## 2015-09-16 NOTE — Op Note (Signed)
[id number] 

## 2015-09-16 NOTE — Discharge Instructions (Addendum)

## 2015-09-17 ENCOUNTER — Encounter (HOSPITAL_BASED_OUTPATIENT_CLINIC_OR_DEPARTMENT_OTHER): Payer: Self-pay | Admitting: Orthopedic Surgery

## 2015-09-17 NOTE — Op Note (Signed)
NAMEALYLA, PIETILA               ACCOUNT NO.:  [id number]  MEDICAL RECORD NO.:  [id number]  LOCATION:                               FACILITY:  [facility]  PHYSICIAN:  Leanora Cover, MD        DATE OF BIRTH:  1976-10-18  DATE OF PROCEDURE:  09/16/2015 DATE OF DISCHARGE:  09/16/2015                              OPERATIVE REPORT   PREOPERATIVE DIAGNOSIS:  Right carpal tunnel syndrome.  POSTOPERATIVE DIAGNOSIS:  Right carpal tunnel syndrome.  PROCEDURE:  Right carpal tunnel release.  SURGEON:  Leanora Cover, MD  ASSISTANT:  None.  ANESTHESIA:  General.  IV FLUIDS:  Per anesthesia flow sheet.  ESTIMATED BLOOD LOSS:  Minimal.  COMPLICATIONS:  None.  SPECIMENS:  None.  TOURNIQUET TIME:  12 minutes.  DISPOSITION:  Stable to PACU.  INDICATIONS:  Ms. [name] is a 39 year old female who has had carpal tunnel symptoms in her hands for many years.  She has positive nerve conduction studies.  She wishes to have carpal tunnel release for management of the symptoms.  Risks, benefits and alternatives of the surgery were discussed including the risk of blood loss; infection; damage to nerves, vessels, tendons, ligaments, bone; failure of surgery; need for additional surgery; complications with wound healing; continued pain; recurrence of carpal tunnel syndrome and damage to motor branch. She voiced understanding of these risks and elected to proceed.  OPERATIVE COURSE:  After being identified preoperatively by myself, the patient and I agreed upon the procedure and site of procedure.  Surgical site was marked.  Risks, benefits, and alternatives of the surgery were reviewed and she wished to proceed.  Surgical consent was signed.  She was given IV Ancef as preoperative antibiotic prophylaxis.  She was transferred to the operating room and placed on the operating room table in supine position with the right upper extremity on an armboard. General anesthesia was induced by Anesthesiology.   Right upper extremity was prepped and draped in normal sterile orthopedic fashion.  A surgical pause was performed between the surgeons, anesthesia and operating room staff, and all were in agreement as to the patient, procedure, and site of procedure.  Tourniquet at the proximal aspect of the extremity was inflated to 250 mmHg after exsanguination of the limb with Esmarch bandage.  Incision was made over the transverse carpal ligament, carried into the subcutaneous tissues by spreading technique.  Bipolar electrocautery was used to obtain hemostasis.  The palmar fascia was sharply incised.  The transverse carpal ligament was identified.  It was incised sharply.  It was incised distally first.  Care was taken to ensure complete decompression distally.  It was incised proximally. Scissors were used to split the distal aspect of the volar antebrachial fascia.  A finger was placed into the wound to ensure complete decompression, which was the case.  The nerve was inspected.  It was adherent to the radial leaflet.  It was freed up.  The motor branch was identified and was intact.  The wound was copiously irrigated with sterile saline.  It was then closed with 4-0 nylon in a horizontal mattress fashion, was injected with 10 mL of 0.25% plain Marcaine to aid in  postoperative analgesia.  The wound was then dressed with sterile Xeroform, 4x4s, and ABD and wrapped with Kerlix and Ace bandage. Tourniquet was deflated at 12 minutes.  Fingertips were pink with brisk capillary refill after deflation of the tourniquet.  Operative drapes were broken down and the patient was awoken from anesthesia safely.  She was transferred back to stretcher and taken to PACU in stable condition. I will see her back in the office in 1 week for postoperative followup. I will give her Norco 5/325, 1-2 p.o. q.6 hours p.r.n. pain, dispense #30.     Leanora Cover, MD     KK/MEDQ  D:  09/16/2015  T:  09/16/2015  Job:   [id number]

## 2015-10-13 ENCOUNTER — Encounter: Payer: Self-pay | Admitting: Dietician

## 2015-10-13 ENCOUNTER — Encounter: Payer: 59 | Attending: Family Medicine | Admitting: Dietician

## 2015-10-13 DIAGNOSIS — Z6841 Body Mass Index (BMI) 40.0 and over, adult: Secondary | ICD-10-CM | POA: Diagnosis not present

## 2015-10-13 DIAGNOSIS — E119 Type 2 diabetes mellitus without complications: Secondary | ICD-10-CM | POA: Insufficient documentation

## 2015-10-13 DIAGNOSIS — Z713 Dietary counseling and surveillance: Secondary | ICD-10-CM | POA: Diagnosis not present

## 2015-10-13 DIAGNOSIS — E282 Polycystic ovarian syndrome: Secondary | ICD-10-CM | POA: Diagnosis not present

## 2015-10-13 NOTE — Progress Notes (Signed)
  Diabetes Self-Management Education  Visit Type:  Follow up for weight control in patient with type 2 diabetes.  Appt. Start Time: 1600 Appt. End Time: 1615  10/13/2015  Ms. [name], identified by name and date of birth, is a 39 y.o. female with a diagnosis of Diabetes:  .  Other hx includes PCOS, hyperlipidemia, HTN, Extreme obesity with BMI of 51.    Labs: Cholesterol 247, Triglycerides 320, HDL 43, LDL 141 Sleep:  About 6 hours per night Weight hx:  Highest weight is current weight.  Lowest weight 165 lbs after college.  PCOS since age 77.    Patient lives with her husband and 9 yo daughter.  Husband works 11 am-8 pm.  Patient does the shopping and cooking.   Patient is here alone today.  She has lost 6 lbs in the last month and 12 lbs in the last 2 months.  She states that she is starting to try to go to bed earlier (12 or 12:15) to increase the amount of sleep.  She gets up at 7 am to get her daughter to school.  She is now walking 20-30 minutes 2-3 days per week.  She is more mindful when eating out and uses the [employer] [place] app to help her with choices.  She does not like a lot of vegetables but has been trying to increase her intake of these at times.  ASSESSMENT Weight 255 lbs.  10/13/15  Wt Readings from Last 3 Encounters:  09/16/15 261 lb (118.389 kg)  09/15/15 261 lb (118.389 kg)  08/06/15 267 lb 14.4 oz (121.519 kg)   Dietary intake: Breakfast, cereal with milk (measure these) Lunch:  1/2 [place] and cheese sandwich on [employer] (rushed today) Snack:  Banana Dinner:  Grilled chicken caesar salad Beverages:  Diet soda, small amount water  Continued education/counseling for weight loss.  Needs continued encouragement to increase water intake.  Plan:  Consider asking your doctor to check your vitamin D level. Great job on the changes that you have been making (improving sleep, starting to add exercise). Keep up the exercise habit.  Increase this as able.  (Goal 4 days  per week for now) Continue Breakfast, lunch, dinner. Add more non starchy vegetables as able.  Expected Outcomes:   Patient continues to be motivated to make gradual changes.  She is more mindful about her eating habits and lifestyle habits.    Education material provided: Living Well with Diabetes, Food label handouts, A1C conversion sheet, Meal plan card, My Plate and Snack sheet, Breakfast ideas on last visit  If problems or questions, patient to contact team via:  Phone and Email  Future DSME appointment:  6 weeks

## 2015-10-13 NOTE — Patient Instructions (Signed)
Consider asking your doctor to check your vitamin D level. Great job on the changes that you have been making (improving sleep, starting to add exercise). Keep up the exercise habit.  Increase this as able.  (Goal 4 days per week for now) Continue Breakfast, lunch, dinner. Add more non starchy vegetables as able.

## 2015-11-04 ENCOUNTER — Other Ambulatory Visit: Payer: Self-pay | Admitting: Orthopedic Surgery

## 2015-11-06 ENCOUNTER — Ambulatory Visit: Payer: 59 | Admitting: Family Medicine

## 2015-11-18 ENCOUNTER — Other Ambulatory Visit: Payer: Self-pay | Admitting: Family Medicine

## 2015-11-18 MED ORDER — ROSUVASTATIN CALCIUM 10 MG PO TABS: 10.0000 mg | ORAL_TABLET | Freq: Every day | ORAL | Status: DC

## 2015-11-18 NOTE — Telephone Encounter (Signed)
Filled for 6 months on 08/18/15 to [place] today for 90 days to [facility] Pt has upcoming appt on 12/02/15

## 2015-11-24 ENCOUNTER — Ambulatory Visit: Payer: 59 | Admitting: Dietician

## 2015-11-26 DIAGNOSIS — G5602 Carpal tunnel syndrome, left upper limb: Secondary | ICD-10-CM

## 2015-11-26 HISTORY — DX: Carpal tunnel syndrome, left upper limb: G56.02

## 2015-11-28 ENCOUNTER — Telehealth: Payer: Self-pay | Admitting: *Deleted

## 2015-11-28 DIAGNOSIS — I10 Essential (primary) hypertension: Secondary | ICD-10-CM

## 2015-11-28 MED ORDER — METFORMIN HCL 500 MG PO TABS: 500.0000 mg | ORAL_TABLET | Freq: Two times a day (BID) | ORAL | Status: DC

## 2015-11-28 MED ORDER — LOSARTAN POTASSIUM 50 MG PO TABS: 75.0000 mg | ORAL_TABLET | Freq: Every day | ORAL | Status: DC

## 2015-11-28 NOTE — Telephone Encounter (Signed)
Ok to refill x1 month. Needs to keep upcoming appointment. Thanks.

## 2015-11-28 NOTE — Telephone Encounter (Signed)
[facility] request a refill of  metFORMIN (GLUCOPHAGE) 500 MG tablet and losartan (COZAAR) 50 MG tablet 90 day supply  Last visit 08/06/15 Okay to fill?

## 2015-11-28 NOTE — Telephone Encounter (Signed)
rx sent

## 2015-12-02 ENCOUNTER — Ambulatory Visit (INDEPENDENT_AMBULATORY_CARE_PROVIDER_SITE_OTHER): Payer: 59 | Admitting: Family Medicine

## 2015-12-02 ENCOUNTER — Encounter: Payer: Self-pay | Admitting: Family Medicine

## 2015-12-02 VITALS — BP 132/98 | HR 87 | Temp 98.0°F | Ht 60.0 in | Wt 255.4 lb

## 2015-12-02 DIAGNOSIS — E119 Type 2 diabetes mellitus without complications: Secondary | ICD-10-CM

## 2015-12-02 DIAGNOSIS — I10 Essential (primary) hypertension: Secondary | ICD-10-CM

## 2015-12-02 DIAGNOSIS — E785 Hyperlipidemia, unspecified: Secondary | ICD-10-CM

## 2015-12-02 DIAGNOSIS — Z23 Encounter for immunization: Secondary | ICD-10-CM

## 2015-12-02 DIAGNOSIS — F3342 Major depressive disorder, recurrent, in full remission: Secondary | ICD-10-CM

## 2015-12-02 HISTORY — DX: Hyperlipidemia, unspecified: E78.5

## 2015-12-02 LAB — BASIC METABOLIC PANEL
BUN: 10 mg/dL (ref 6–23)
CALCIUM: 9.2 mg/dL (ref 8.4–10.5)
CO2: 29 mEq/L (ref 19–32)
CREATININE: 0.62 mg/dL (ref 0.40–1.20)
Chloride: 104 mEq/L (ref 96–112)
GFR: 113.53 mL/min (ref >60.00)
GLUCOSE: 105 mg/dL — AB (ref 70–99)
Potassium: 4.4 mEq/L (ref 3.5–5.1)
Sodium: 140 mEq/L (ref 135–145)

## 2015-12-02 LAB — LIPID PANEL
CHOL/HDL RATIO: 3
CHOLESTEROL: 139 mg/dL (ref 0–200)
HDL: 40.4 mg/dL (ref >39.00)
NonHDL: 98.25
TRIGLYCERIDES: 279 mg/dL — AB (ref 0.0–149.0)
VLDL: 55.8 mg/dL — AB (ref 0.0–40.0)

## 2015-12-02 LAB — LDL CHOLESTEROL, DIRECT: Direct LDL: 57 mg/dL

## 2015-12-02 LAB — HEMOGLOBIN A1C: Hgb A1c MFr Bld: 6.5 % (ref 4.6–6.5)

## 2015-12-02 MED ORDER — LOSARTAN POTASSIUM 100 MG PO TABS: 100.0000 mg | ORAL_TABLET | Freq: Every day | ORAL | Status: DC

## 2015-12-02 NOTE — Progress Notes (Signed)
Pre visit review using our clinic review tool, if applicable. No additional management support is needed unless otherwise documented below in the visit note. 

## 2015-12-02 NOTE — Progress Notes (Signed)
HPI:  HTN: -issues with taking 1.5 tabs of losartan, wants to take 1 tab for refill convenience -did not take this morning - took last night -denies: CP, SOB, DOE, swelling, HA changes -reports labs with prior PCP ok  HLD/DM/morbid obesity: -meds: metformin 500 mg bid, rosuvastatin 6m -lifestyle recs and weight reduction advised -reports: no regular exercise, diet not great -denies polyuria, pulydipsia -reports chronic poor vision and sees eye doctor yearly  Depression and Anxiety: -Seeing Dr. [facility]is [facility]- on trazadone and sertraline - no hx of bipolar disorder, SI or hospitalization -no counseling  Hx of migraines and frequent headaches: -started when she was in college > 15 years ago -tend to be R sided temporal with nausea and vomiting occ, sometimes tension headaches -usually takes tylenol which works ok -no aura -has 1 headache every 1-2 months, now, were more frequent in the past  Hey fever, Allergies: -chronic -minor nasal symptoms  Abnormal pap/PCOS: -reports seeing her gynecologist for this, s/p LEEP -reports had pap in December and having repeat exam in June  Hx of nephrolithiasis: -[facility] has a [facility] -has not had any issues in several years -hx lithotripsy     ROS: See pertinent positives and negatives per HPI.  Past Medical History  Diagnosis Date  . Migraines   . Tension headache, chronic   . Anxiety   . Depression   . Non-insulin dependent [place] 2 diabetes mellitus [place]   . Carpal tunnel syndrome of right wrist 08/2015  . Hypertension     states under control with med., has been on med. < 6 mos.  . History of kidney stones   . Chronic low back pain     Past Surgical History  Procedure Laterality Date  . Cesarean section  05/06/2007  . Wisdom tooth extraction    . Leep N/A 03/17/2015    Procedure: LOOP ELECTROSURGICAL EXCISION PROCEDURE (LEEP) and Cold Knife Conization;  Surgeon: VEldred Manges MD;   [facility];  Service: Gynecology;  Laterality: N/A;  . Extracorporeal shock wave lithotripsy  11/22/2011  . Carpal tunnel release Right 09/16/2015    Procedure: RIGHT CARPAL TUNNEL RELEASE;  Surgeon: KLeanora Cover MD;  [facility]  Service: Orthopedics;  Laterality: Right;    Family History  Problem Relation Age of Onset  . Arthritis Mother   . Breast cancer Paternal Aunt   . Mental illness Father     Social History   Social History  . Marital Status: Married    Spouse Name: N/A  . Number of Children: N/A  . Years of Education: N/A   Social History Main Topics  . Smoking status: Former Smoker -- 0.00 packs/day    Quit date: 09/07/1998  . Smokeless tobacco: Never Used  . Alcohol Use: Yes     Comment: occasionally  . Drug Use: No  . Sexual Activity: Yes    Birth Control/ Protection: Pill     Comment: microgestin   Other Topics Concern  . 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  acetaminophen (TYLENOL) 325 MG tablet, Take 650 mg by mouth every 6 (six) hours as needed., Disp: , Rfl:  .  metFORMIN (GLUCOPHAGE) 500 MG tablet, Take 1 tablet (500 mg total) by mouth 2 (two) times daily with a meal., Disp: 180 tablet, Rfl: 0 .  norethindrone (HEATHER) 0.35 MG tablet, Take  1 tablet by mouth daily., Disp: , Rfl:  .  rosuvastatin (CRESTOR) 10 MG tablet, Take 1 tablet (10 mg total) by mouth daily., Disp: 90 tablet, Rfl: 0 .  sertraline (ZOLOFT) 100 MG tablet, Take 150 mg by mouth daily. , Disp: , Rfl:  .  traZODone (DESYREL) 150 MG tablet, Take 150 mg by mouth at bedtime.  , Disp: , Rfl:  .  losartan (COZAAR) 100 MG tablet, Take 1 tablet (100 mg total) by mouth daily., Disp: 90 tablet, Rfl: 3  EXAM:  Filed Vitals:   12/02/15 0913  BP: 132/98  Pulse: 87  Temp: 98 F (36.7 C)    Body mass index is 49.88 kg/(m^2).  GENERAL: vitals reviewed and listed above, alert, oriented,  appears well hydrated and in no acute distress  HEENT: atraumatic, conjunttiva clear, no obvious abnormalities on inspection of external nose and ears  NECK: no obvious masses on inspection  LUNGS: clear to auscultation bilaterally, no wheezes, rales or rhonchi, good air movement  CV: HRRR, no peripheral edema  MS: moves all extremities without noticeable abnormality  PSYCH: pleasant and cooperative, no obvious depression or anxiety  ASSESSMENT AND PLAN:  Discussed the following assessment and plan:  Type 2 diabetes mellitus without complication, without long-term current use of insulin (HCC) - Plan: Hemoglobin A1c  Morbid obesity, unspecified obesity type (HCC)  Hyperlipemia - Plan: Lipid Panel  Recurrent major depressive disorder, in full remission ([place]) - and anxiety, Managed by Dr. Clovis Pu at [facility] - Plan: Basic metabolic panel  Need for prophylactic vaccination against Streptococcus pneumoniae (pneumococcus) - Plan: Pneumococcal polysaccharide vaccine 23-valent greater than or equal to 2yo subcutaneous/IM  -labs -HM: pneumococcal today, foot exam done, advised assistant to obtain gyn pap report, optho exam advised - pt reports is scheduled -opted for losartan 100 for convenience and mild elevated BP -recheck BP with nurse in a few weeks -lifestyle recs -Patient advised to return or notify a doctor immediately if symptoms worsen or persist or new concerns arise.  Patient Instructions  BEFORE YOU LEAVE: -labs -pneumococcal vaccine -nurse visit to check blood pressure in 3-6 weeks -permission and records of last pap smear with her gynecologist -PHYSICAL EXAM in 3-4 months  Sent new prescription for the Losartan to take once daily per your request  We recommend the following healthy lifestyle measures: - eat a healthy whole foods diet consisting of regular small meals composed of vegetables, fruits, beans, nuts, seeds, healthy meats such as  white chicken and fish and whole grains.  - avoid sweets, white starchy foods, fried foods, fast food, processed foods, sodas, red meet and other fattening foods.  - get a least 150-300 minutes of aerobic exercise per week.   -We have ordered labs or studies at this visit. It can take up to 1-2 weeks for results and processing. We will contact you with instructions IF your results are abnormal. Normal results will be released to your [facility]. If you have not heard from [place] or can not find your results in [facility] in 2 weeks please contact our office.            Colin Benton R.

## 2015-12-02 NOTE — Patient Instructions (Addendum)
BEFORE YOU LEAVE: -labs -pneumococcal vaccine -nurse visit to check blood pressure in 3-6 weeks -permission and records of last pap smear with her gynecologist -PHYSICAL EXAM in 3-4 months  Sent new prescription for the Losartan to take once daily per your request  We recommend the following healthy lifestyle measures: - eat a healthy whole foods diet consisting of regular small meals composed of vegetables, fruits, beans, nuts, seeds, healthy meats such as white chicken and fish and whole grains.  - avoid sweets, white starchy foods, fried foods, fast food, processed foods, sodas, red meet and other fattening foods.  - get a least 150-300 minutes of aerobic exercise per week.   -We have ordered labs or studies at this visit. It can take up to 1-2 weeks for results and processing. We will contact you with instructions IF your results are abnormal. Normal results will be released to your [facility]. If you have not heard from [place] or can not find your results in [facility] in 2 weeks please contact our office.

## 2015-12-08 ENCOUNTER — Encounter (HOSPITAL_BASED_OUTPATIENT_CLINIC_OR_DEPARTMENT_OTHER): Payer: Self-pay | Admitting: *Deleted

## 2015-12-08 DIAGNOSIS — R059 Cough, unspecified: Secondary | ICD-10-CM

## 2015-12-08 HISTORY — DX: Cough, unspecified: R05.9

## 2015-12-08 NOTE — Pre-Procedure Instructions (Signed)
Does not need to come for anesthesia airway evaluation, had CTR in 08/2015 and was seen by anesthesia at that time, per Dr. Al Corpus.

## 2015-12-12 ENCOUNTER — Ambulatory Visit (HOSPITAL_BASED_OUTPATIENT_CLINIC_OR_DEPARTMENT_OTHER): Payer: 59 | Admitting: Anesthesiology

## 2015-12-12 ENCOUNTER — Encounter (HOSPITAL_BASED_OUTPATIENT_CLINIC_OR_DEPARTMENT_OTHER)
Admission: RE | Disposition: A | Discharge status: Home / Self Care | Payer: Self-pay | Source: Ambulatory Visit | Attending: Orthopedic Surgery

## 2015-12-12 ENCOUNTER — Ambulatory Visit (HOSPITAL_BASED_OUTPATIENT_CLINIC_OR_DEPARTMENT_OTHER)
Admission: RE | Admit: 2015-12-12 | Discharge: 2015-12-12 | Disposition: A | Discharge status: Home / Self Care | Payer: 59 | Source: Ambulatory Visit | Attending: Orthopedic Surgery | Admitting: Orthopedic Surgery

## 2015-12-12 ENCOUNTER — Encounter (HOSPITAL_BASED_OUTPATIENT_CLINIC_OR_DEPARTMENT_OTHER): Payer: Self-pay | Admitting: Anesthesiology

## 2015-12-12 DIAGNOSIS — E119 Type 2 diabetes mellitus without complications: Secondary | ICD-10-CM | POA: Insufficient documentation

## 2015-12-12 DIAGNOSIS — Z87891 Personal history of nicotine dependence: Secondary | ICD-10-CM | POA: Diagnosis not present

## 2015-12-12 DIAGNOSIS — Z79899 Other long term (current) drug therapy: Secondary | ICD-10-CM | POA: Insufficient documentation

## 2015-12-12 DIAGNOSIS — F329 Major depressive disorder, single episode, unspecified: Secondary | ICD-10-CM | POA: Diagnosis not present

## 2015-12-12 DIAGNOSIS — I10 Essential (primary) hypertension: Secondary | ICD-10-CM | POA: Diagnosis not present

## 2015-12-12 DIAGNOSIS — G5602 Carpal tunnel syndrome, left upper limb: Secondary | ICD-10-CM | POA: Insufficient documentation

## 2015-12-12 DIAGNOSIS — G8929 Other chronic pain: Secondary | ICD-10-CM | POA: Insufficient documentation

## 2015-12-12 DIAGNOSIS — Z7984 Long term (current) use of oral hypoglycemic drugs: Secondary | ICD-10-CM | POA: Insufficient documentation

## 2015-12-12 DIAGNOSIS — Z793 Long term (current) use of hormonal contraceptives: Secondary | ICD-10-CM | POA: Insufficient documentation

## 2015-12-12 DIAGNOSIS — M545 Low back pain: Secondary | ICD-10-CM | POA: Insufficient documentation

## 2015-12-12 HISTORY — PX: CARPAL TUNNEL RELEASE: SHX101

## 2015-12-12 HISTORY — DX: Cough: R05

## 2015-12-12 HISTORY — DX: Carpal tunnel syndrome, left upper limb: G56.02

## 2015-12-12 HISTORY — DX: Polycystic ovarian syndrome: E28.2

## 2015-12-12 LAB — GLUCOSE, CAPILLARY
Glucose-Capillary: 93 mg/dL (ref 65–99)
Glucose-Capillary: 87 mg/dL (ref 65–99)

## 2015-12-12 SURGERY — CARPAL TUNNEL RELEASE
Anesthesia: General | Site: Wrist | Laterality: Left

## 2015-12-12 MED ORDER — OXYCODONE HCL 5 MG/5ML PO SOLN: 5.0000 mg | Freq: Once | ORAL | Status: DC | PRN

## 2015-12-12 MED ORDER — DEXAMETHASONE SODIUM PHOSPHATE 4 MG/ML IJ SOLN
INTRAMUSCULAR | Status: DC | PRN
  Administered 2015-12-12: 10 mg via INTRAVENOUS

## 2015-12-12 MED ORDER — BUPIVACAINE HCL (PF) 0.25 % IJ SOLN
INTRAMUSCULAR | Status: DC | PRN
  Administered 2015-12-12: 10 mL

## 2015-12-12 MED ORDER — CHLORHEXIDINE GLUCONATE 4 % EX LIQD: 60.0000 mL | Freq: Once | CUTANEOUS | Status: DC

## 2015-12-12 MED ORDER — LIDOCAINE HCL (CARDIAC) 20 MG/ML IV SOLN
INTRAVENOUS | Status: DC | PRN
  Administered 2015-12-12: 50 mg via INTRAVENOUS

## 2015-12-12 MED ORDER — OXYCODONE HCL 5 MG PO TABS: 5.0000 mg | ORAL_TABLET | Freq: Once | ORAL | Status: DC | PRN

## 2015-12-12 MED ORDER — MIDAZOLAM HCL 2 MG/2ML IJ SOLN
INTRAMUSCULAR | Status: AC
  Filled 2015-12-12: qty 2

## 2015-12-12 MED ORDER — DEXAMETHASONE SODIUM PHOSPHATE 10 MG/ML IJ SOLN
INTRAMUSCULAR | Status: AC
  Filled 2015-12-12: qty 1

## 2015-12-12 MED ORDER — ONDANSETRON HCL 4 MG/2ML IJ SOLN
INTRAMUSCULAR | Status: AC
  Filled 2015-12-12: qty 2

## 2015-12-12 MED ORDER — GLYCOPYRROLATE 0.2 MG/ML IJ SOLN: 0.2000 mg | Freq: Once | INTRAMUSCULAR | Status: DC | PRN

## 2015-12-12 MED ORDER — PROPOFOL 10 MG/ML IV BOLUS
INTRAVENOUS | Status: AC
  Filled 2015-12-12: qty 40

## 2015-12-12 MED ORDER — SCOPOLAMINE 1 MG/3DAYS TD PT72: 1.0000 | MEDICATED_PATCH | Freq: Once | TRANSDERMAL | Status: DC | PRN

## 2015-12-12 MED ORDER — CEFAZOLIN SODIUM-DEXTROSE 2-3 GM-% IV SOLR
2.0000 g | INTRAVENOUS | Status: AC
  Administered 2015-12-12: 2 g via INTRAVENOUS

## 2015-12-12 MED ORDER — CEFAZOLIN SODIUM-DEXTROSE 2-3 GM-% IV SOLR
INTRAVENOUS | Status: AC
  Filled 2015-12-12: qty 50

## 2015-12-12 MED ORDER — FENTANYL CITRATE (PF) 100 MCG/2ML IJ SOLN
INTRAMUSCULAR | Status: AC
  Filled 2015-12-12: qty 2

## 2015-12-12 MED ORDER — FENTANYL CITRATE (PF) 100 MCG/2ML IJ SOLN
25.0000 ug | INTRAMUSCULAR | Status: DC | PRN
  Administered 2015-12-12 (×2): 50 ug via INTRAVENOUS

## 2015-12-12 MED ORDER — 0.9 % SODIUM CHLORIDE (POUR BTL) OPTIME
TOPICAL | Status: DC | PRN
  Administered 2015-12-12: 250 mL

## 2015-12-12 MED ORDER — HYDROCODONE-ACETAMINOPHEN 5-325 MG PO TABS: ORAL_TABLET | ORAL | Status: DC

## 2015-12-12 MED ORDER — ONDANSETRON HCL 4 MG/2ML IJ SOLN
INTRAMUSCULAR | Status: DC | PRN
  Administered 2015-12-12: 4 mg via INTRAVENOUS

## 2015-12-12 MED ORDER — ONDANSETRON HCL 4 MG/2ML IJ SOLN: 4.0000 mg | Freq: Once | INTRAMUSCULAR | Status: DC | PRN

## 2015-12-12 MED ORDER — FENTANYL CITRATE (PF) 100 MCG/2ML IJ SOLN
50.0000 ug | INTRAMUSCULAR | Status: DC | PRN
  Administered 2015-12-12: 100 ug via INTRAVENOUS

## 2015-12-12 MED ORDER — MIDAZOLAM HCL 2 MG/2ML IJ SOLN
1.0000 mg | INTRAMUSCULAR | Status: DC | PRN
  Administered 2015-12-12: 2 mg via INTRAVENOUS

## 2015-12-12 MED ORDER — FENTANYL CITRATE (PF) 100 MCG/2ML IJ SOLN
INTRAMUSCULAR | Status: AC
Start: 2015-12-12 — End: 2015-12-12
  Filled 2015-12-12: qty 2

## 2015-12-12 MED ORDER — PROPOFOL 10 MG/ML IV BOLUS
INTRAVENOUS | Status: DC | PRN
  Administered 2015-12-12: 200 mg via INTRAVENOUS

## 2015-12-12 MED ORDER — LACTATED RINGERS IV SOLN
INTRAVENOUS | Status: DC
  Administered 2015-12-12: 13:00:00 via INTRAVENOUS
  Administered 2015-12-12: 10 mL/h via INTRAVENOUS

## 2015-12-12 MED ORDER — LIDOCAINE HCL (CARDIAC) 20 MG/ML IV SOLN
INTRAVENOUS | Status: AC
  Filled 2015-12-12: qty 5

## 2015-12-12 SURGICAL SUPPLY — 38 items
BANDAGE ACE 3X5.8 VEL STRL LF (GAUZE/BANDAGES/DRESSINGS) ×3 IMPLANT
BLADE SURG 15 STRL LF DISP TIS (BLADE) ×2 IMPLANT
BLADE SURG 15 STRL SS (BLADE) ×4
BNDG ESMARK 4X9 LF (GAUZE/BANDAGES/DRESSINGS) ×3 IMPLANT
BNDG GAUZE ELAST 4 BULKY (GAUZE/BANDAGES/DRESSINGS) ×3 IMPLANT
CHLORAPREP W/TINT 26ML (MISCELLANEOUS) ×3 IMPLANT
CORDS BIPOLAR (ELECTRODE) ×3 IMPLANT
COVER BACK TABLE 60X90IN (DRAPES) ×3 IMPLANT
COVER MAYO STAND STRL (DRAPES) ×3 IMPLANT
CUFF TOURNIQUET SINGLE 18IN (TOURNIQUET CUFF) ×3 IMPLANT
DRAPE EXTREMITY T 121X128X90 (DRAPE) ×3 IMPLANT
DRAPE SURG 17X23 STRL (DRAPES) ×3 IMPLANT
DRSG PAD ABDOMINAL 8X10 ST (GAUZE/BANDAGES/DRESSINGS) ×3 IMPLANT
GAUZE SPONGE 4X4 12PLY STRL (GAUZE/BANDAGES/DRESSINGS) ×3 IMPLANT
GAUZE XEROFORM 1X8 LF (GAUZE/BANDAGES/DRESSINGS) ×3 IMPLANT
GLOVE BIO SURGEON STRL SZ7 (GLOVE) ×3 IMPLANT
GLOVE BIO SURGEON STRL SZ7.5 (GLOVE) ×3 IMPLANT
GLOVE BIOGEL PI IND STRL 7.5 (GLOVE) ×1 IMPLANT
GLOVE BIOGEL PI IND STRL 8 (GLOVE) ×1 IMPLANT
GLOVE BIOGEL PI INDICATOR 7.5 (GLOVE) ×2
GLOVE BIOGEL PI INDICATOR 8 (GLOVE) ×2
GLOVE EXAM NITRILE MD LF STRL (GLOVE) ×3 IMPLANT
GOWN STRL REUS W/ TWL LRG LVL3 (GOWN DISPOSABLE) ×1 IMPLANT
GOWN STRL REUS W/TWL LRG LVL3 (GOWN DISPOSABLE) ×2
GOWN STRL REUS W/TWL XL LVL3 (GOWN DISPOSABLE) ×3 IMPLANT
NEEDLE HYPO 25X1 1.5 SAFETY (NEEDLE) ×3 IMPLANT
NS IRRIG 1000ML POUR BTL (IV SOLUTION) ×3 IMPLANT
PACK BASIN DAY SURGERY FS (CUSTOM PROCEDURE TRAY) ×3 IMPLANT
PADDING CAST ABS 4INX4YD NS (CAST SUPPLIES) ×2
PADDING CAST ABS COTTON 4X4 ST (CAST SUPPLIES) ×1 IMPLANT
STOCKINETTE 4X48 STRL (DRAPES) IMPLANT
STOCKINETTE 6  STRL (DRAPES) ×2
STOCKINETTE 6 STRL (DRAPES) ×1 IMPLANT
SUT ETHILON 4 0 PS 2 18 (SUTURE) ×3 IMPLANT
SYR BULB 3OZ (MISCELLANEOUS) ×3 IMPLANT
SYR CONTROL 10ML LL (SYRINGE) ×3 IMPLANT
TOWEL OR 17X24 6PK STRL BLUE (TOWEL DISPOSABLE) ×6 IMPLANT
UNDERPAD 30X30 (UNDERPADS AND DIAPERS) ×3 IMPLANT

## 2015-12-12 NOTE — H&P (Signed)
[name] is an 40 y.o. female.   Chief Complaint: left carpal tunnel syndrome HPI: 40 yo female with left carpal tunnel syndrome.  She has had a right carpal tunnel release with good results and wishes to have a left carpal tunnel release.  Positive nerve conduction studies.  Allergies: No Known Allergies  Past Medical History  Diagnosis Date  . Anxiety   . Depression   . Non-insulin dependent type 2 diabetes mellitus ([place])   . History of kidney stones   . Chronic low back pain   . Migraines   . Tension headache, chronic   . Polycystic ovarian syndrome   . Hypertension     states under control with med., has been on med. < 9 mos.  . Carpal tunnel syndrome of left wrist 11/2015  . Cough 12/08/2015    Past Surgical History  Procedure Laterality Date  . Cesarean section  05/06/2007  . Wisdom tooth extraction    . Leep N/A 03/17/2015    Procedure: LOOP ELECTROSURGICAL EXCISION PROCEDURE (LEEP) and Cold Knife Conization;  Surgeon: Eldred Manges, MD;  Location: [facility];  Service: Gynecology;  Laterality: N/A;  . Extracorporeal shock wave lithotripsy  11/22/2011  . Carpal tunnel release Right 09/16/2015    Procedure: RIGHT CARPAL TUNNEL RELEASE;  Surgeon: Leanora Cover, MD;  Location: [facility];  Service: Orthopedics;  Laterality: Right;    Family History: Family History  Problem Relation Age of Onset  . Arthritis Mother   . Breast cancer Paternal Aunt   . Mental illness Father     Social History:   reports that she quit smoking about 17 years ago. She has never used smokeless tobacco. She reports that she drinks alcohol. She reports that she does not use illicit drugs.  Medications: Medications Prior to Admission  Medication Sig Dispense Refill  . losartan (COZAAR) 100 MG tablet Take 1 tablet (100 mg total) by mouth daily. 90 tablet 3  . metFORMIN (GLUCOPHAGE) 500 MG tablet Take 1 tablet (500 mg total) by mouth 2 (two) times daily with a meal. 180 tablet  0  . norethindrone (HEATHER) 0.35 MG tablet Take 1 tablet by mouth daily.    . rosuvastatin (CRESTOR) 10 MG tablet Take 1 tablet (10 mg total) by mouth daily. 90 tablet 0  . sertraline (ZOLOFT) 100 MG tablet Take 150 mg by mouth daily.     . traZODone (DESYREL) 150 MG tablet Take 150 mg by mouth at bedtime.        No results found for this or any previous visit (from the past 48 hour(s)).  No results found.   A comprehensive review of systems was negative.  Blood pressure 149/77, pulse 90, temperature 98.5 F (36.9 C), temperature source Oral, resp. rate 20, height 5' (1.524 m), weight 114.533 kg (252 lb 8 oz), last menstrual period 10/20/2015, SpO2 96 %.  General appearance: alert, cooperative and appears stated age Head: Normocephalic, without obvious abnormality, atraumatic Neck: supple, symmetrical, trachea midline Resp: clear to auscultation bilaterally Cardio: regular rate and rhythm GI: non-tender Extremities: intact sensation and capillary refill all digits. +epl/fpl/io.  no wounds. Pulses: 2+ and symmetric Skin: Skin color, texture, turgor normal. No rashes or lesions Neurologic: Grossly normal Incision/Wound:  none  Assessment/Plan Left carpal tunnel syndrome.  Non operative and operative treatment options were discussed with the patient and patient wishes to proceed with operative treatment. Risks, benefits, and alternatives of surgery were discussed and the patient agrees with  the plan of care.   Renard Caperton R 12/12/2015, 1:29 PM

## 2015-12-12 NOTE — Op Note (Signed)
[name], Hooper               ACCOUNT NO.:  [id number]  MEDICAL RECORD NO.:  [id number]  LOCATION:                                 FACILITY:  PHYSICIAN:  Leanora Cover, MD        DATE OF BIRTH:  1976-06-30  DATE OF PROCEDURE:  12/12/2015 DATE OF DISCHARGE:                              OPERATIVE REPORT   PREOPERATIVE DIAGNOSIS:  Left carpal tunnel syndrome.  POSTOPERATIVE DIAGNOSIS:  Left carpal tunnel syndrome.  PROCEDURE:  Left carpal tunnel release.  SURGEON:  Leanora Cover, MD.  ASSISTANT:  None.  ANESTHESIA:  General.  IV FLUIDS:  Per anesthesia flow sheet.  ESTIMATED BLOOD LOSS:  Minimal.  COMPLICATIONS:  None.  SPECIMENS:  None.  TOURNIQUET TIME:  16 minutes.  DISPOSITION:  Stable to PACU.  INDICATIONS:  [name] is a 40 year old female with carpal tunnel symptoms in the left hand.  She has had a previous right carpal tunnel release and has been happy with results.  She has positive nerve conduction studies.  The patient wished to have a carpal tunnel release for management of symptoms.  Risks, benefits, and alternatives of surgery were discussed including risk of blood loss; infection; damage to nerves, vessels, tendons, ligaments, bone; failure of surgery; need for additional surgery; complications with wound healing; continued pain; and recurrence of carpal tunnel syndrome; and damage to motor branch.  She voiced understanding of these risks and elected to proceed.  OPERATIVE COURSE:  After being identified preoperatively by myself, the patient and I agreed upon procedure and site of procedure.  Surgical site was marked.  The risks, benefits, and alternatives of surgery were reviewed and she wished to proceed.  Surgical consent had been signed. She was given IV Ancef as preoperative antibiotic prophylaxis.  She was transferred to the operating room and placed on the operating room table in supine position with the left upper extremity on arm board.   General anesthesia was induced by anesthesiologist.  The left upper extremity was prepped and draped in normal sterile orthopedic fashion.  Surgical pause was performed between surgeons, anesthesia, and operating room staff; and all were in agreement as to the patient, procedure, and site of procedure.  Tourniquet at the proximal aspect of the extremity was inflated to 250 mmHg after exsanguination of the limb with Esmarch bandage.  An incision was made over the transverse carpal ligament, carried down to subcutaneous tissues by spreading technique.  Bipolar electrocautery was used to obtain hemostasis.  The palmar fascia was sharply incised.  Transverse carpal ligament was identified.  It was incised sharply.  Care was taken to ensure complete decompression distally.  It was then incised proximally.  Scissors were used to split the distal aspect of the volar antebrachial fascia.  A finger was placed into the wound to ensure complete decompression which was the case.  The nerve root was inspected.  It is flat and hyperemic.  The motor branch was identified and was intact.  The wound was copiously irrigated with sterile saline and closed with 4-0 nylon in a horizontal mattress fashion.  It was then injected with 10 mL of 0.25% plain Marcaine to aid in  postoperative analgesia.  It was dressed with sterile Xeroform, 4x4s, and ABD and wrapped with Kerlix and Ace bandage.  The tourniquet was deflated at 16 minutes.  Fingertips were pink with brisk capillary refill after deflation of tourniquet.  The operative drapes were broken down.  The patient was awoken from anesthesia safely.  She was transferred back to stretcher and taken to PACU in stable condition.  I will see her back in the office in 1 week for postoperative followup.  I will give her Norco 5/325, 1-2 p.o. q.6 hours p.r.n. pain, dispensed #30.     Leanora Cover, MD     KK/MEDQ  D:  12/12/2015  T:  12/12/2015  Job:  [id number]

## 2015-12-12 NOTE — Anesthesia Preprocedure Evaluation (Signed)
Anesthesia Evaluation  Patient identified by MRN, date of birth, ID band Patient awake    Reviewed: Allergy & Precautions, NPO status , Patient's Chart, lab work & pertinent test results  Airway Mallampati: II  TM Distance: >3 FB Neck ROM: Full    Dental  (+) Teeth Intact, Dental Advisory Given   Pulmonary former smoker,    breath sounds clear to auscultation       Cardiovascular hypertension,  Rhythm:Regular Rate:Normal     Neuro/Psych    GI/Hepatic   Endo/Other  diabetes  Renal/GU      Musculoskeletal   Abdominal (+) + obese,   Peds  Hematology   Anesthesia Other Findings   Reproductive/Obstetrics                             Anesthesia Physical Anesthesia Plan  ASA: III  Anesthesia Plan: General   Post-op Pain Management:    Induction: Intravenous  Airway Management Planned: LMA  Additional Equipment:   Intra-op Plan:   Post-operative Plan:   Informed Consent: I have reviewed the patients History and Physical, chart, labs and discussed the procedure including the risks, benefits and alternatives for the proposed anesthesia with the patient or authorized representative who has indicated his/her understanding and acceptance.   Dental advisory given  Plan Discussed with: CRNA and Anesthesiologist  Anesthesia Plan Comments: (L. CTS Hypertension on losartan Type 2 DM  Obesity  Plan GA with LMA  Roberts Gaudy )        Anesthesia Quick Evaluation

## 2015-12-12 NOTE — Anesthesia Procedure Notes (Signed)
Procedure Name: LMA Insertion Date/Time: 12/12/2015 1:45 PM Performed by: Lyndee Leo Pre-anesthesia Checklist: Patient identified, Emergency Drugs available, Suction available and Patient being monitored Patient Re-evaluated:Patient Re-evaluated prior to inductionOxygen Delivery Method: Circle System Utilized Preoxygenation: Pre-oxygenation with 100% oxygen Intubation Type: IV induction Ventilation: Mask ventilation without difficulty LMA: LMA inserted LMA Size: 4.0 Number of attempts: 1 Airway Equipment and Method: Bite block Placement Confirmation: positive ETCO2 Tube secured with: Tape Dental Injury: Teeth and Oropharynx as per pre-operative assessment

## 2015-12-12 NOTE — Op Note (Signed)
[id number] 

## 2015-12-12 NOTE — Transfer of Care (Signed)
Immediate Anesthesia Transfer of Care Note  Patient: [name]  Procedure(s) Performed: Procedure(s): LEFT CARPAL TUNNEL RELEASE (Left)  Patient Location: PACU  Anesthesia Type:General  Level of Consciousness: awake, sedated and patient cooperative  Airway & Oxygen Therapy: Patient Spontanous Breathing and Patient connected to face mask oxygen  Post-op Assessment: Report given to RN and Post -op Vital signs reviewed and stable  Post vital signs: Reviewed and stable  Last Vitals:  Filed Vitals:   12/12/15 1218  BP: 149/77  Pulse: 90  Temp: 36.9 C  Resp: 20    Complications: No apparent anesthesia complications

## 2015-12-12 NOTE — Brief Op Note (Signed)
12/12/2015  2:15 PM  PATIENT:  [name]  40 y.o. female  PRE-OPERATIVE DIAGNOSIS:  Left carpal tunnel  POST-OPERATIVE DIAGNOSIS:  Left carpal tunnel  PROCEDURE:  Procedure(s): LEFT CARPAL TUNNEL RELEASE (Left)  SURGEON:  Surgeon(s) and Role:    * Leanora Cover, MD - Primary  PHYSICIAN ASSISTANT:   ASSISTANTS: none   ANESTHESIA:   general  EBL:     BLOOD ADMINISTERED:none  DRAINS: none   LOCAL MEDICATIONS USED:  MARCAINE     SPECIMEN:  No Specimen  DISPOSITION OF SPECIMEN:  N/A  COUNTS:  YES  TOURNIQUET:   Total Tourniquet Time Documented: Upper Arm (Left) - 16 minutes Total: Upper Arm (Left) - 16 minutes   DICTATION: .Other Dictation: Dictation Number [id number]  PLAN OF CARE: Discharge to home after PACU  PATIENT DISPOSITION:  PACU - hemodynamically stable.

## 2015-12-12 NOTE — Discharge Instructions (Addendum)

## 2015-12-15 ENCOUNTER — Encounter (HOSPITAL_BASED_OUTPATIENT_CLINIC_OR_DEPARTMENT_OTHER): Payer: Self-pay | Admitting: Orthopedic Surgery

## 2015-12-25 ENCOUNTER — Ambulatory Visit: Payer: 59 | Admitting: *Deleted

## 2015-12-25 ENCOUNTER — Encounter: Payer: Self-pay | Admitting: Family Medicine

## 2015-12-25 ENCOUNTER — Ambulatory Visit (INDEPENDENT_AMBULATORY_CARE_PROVIDER_SITE_OTHER): Payer: 59 | Admitting: Family Medicine

## 2015-12-25 VITALS — BP 142/82 | HR 86 | Temp 98.9°F | Ht 60.0 in | Wt 256.5 lb

## 2015-12-25 DIAGNOSIS — E785 Hyperlipidemia, unspecified: Secondary | ICD-10-CM

## 2015-12-25 DIAGNOSIS — E119 Type 2 diabetes mellitus without complications: Secondary | ICD-10-CM | POA: Diagnosis not present

## 2015-12-25 DIAGNOSIS — I10 Essential (primary) hypertension: Secondary | ICD-10-CM

## 2015-12-25 MED ORDER — AMLODIPINE BESYLATE 5 MG PO TABS: 5.0000 mg | ORAL_TABLET | Freq: Every day | ORAL | Status: DC

## 2015-12-25 NOTE — Patient Instructions (Signed)
Before you leave: Please ensure that you have follow-up with Dr. Maudie Mercury in 1-2 months.  Please start the Norvasc 5 mg daily for your blood pressure, along with continuation of all of your other medications.  We recommend the following healthy lifestyle measures: - eat a healthy whole foods diet consisting of regular small meals composed of vegetables, fruits, beans, nuts, seeds, healthy meats such as white chicken and fish and whole grains.  - avoid sweets, white starchy foods, fried foods, fast food, processed foods, sodas, red meet and other fattening foods.  - get a least 150-300 minutes of aerobic exercise per week.

## 2015-12-25 NOTE — Progress Notes (Signed)
HPI:  [name] is a very pleasant 40 year old with a past medical history significant morbid obesity, diabetes, hyperlipidemia and hypertension. She came in for a blood pressure check today, and her blood pressure remains elevated despite taking her medication daily. No chest pain, shortness of breath, swelling, palpitations, headache or vision changes.  ROS: See pertinent positives and negatives per HPI.  Past Medical History  Diagnosis Date  . Anxiety   . Depression   . Non-insulin dependent type 2 diabetes mellitus ([place])   . History of kidney stones   . Chronic low back pain   . Migraines   . Tension headache, chronic   . Polycystic ovarian syndrome   . Hypertension     states under control with med., has been on med. < 9 mos.  . Carpal tunnel syndrome of left wrist 11/2015  . Cough 12/08/2015    Past Surgical History  Procedure Laterality Date  . Cesarean section  05/06/2007  . Wisdom tooth extraction    . Leep N/A 03/17/2015    Procedure: LOOP ELECTROSURGICAL EXCISION PROCEDURE (LEEP) and Cold Knife Conization;  Surgeon: Eldred Manges, MD;  Location: [facility];  Service: Gynecology;  Laterality: N/A;  . Extracorporeal shock wave lithotripsy  11/22/2011  . Carpal tunnel release Right 09/16/2015    Procedure: RIGHT CARPAL TUNNEL RELEASE;  Surgeon: Leanora Cover, MD;  Location: [place];  Service: Orthopedics;  Laterality: Right;  . Carpal tunnel release Left 12/12/2015    Procedure: LEFT CARPAL TUNNEL RELEASE;  Surgeon: Leanora Cover, MD;  Location: [place];  Service: Orthopedics;  Laterality: Left;    Family History  Problem Relation Age of Onset  . Arthritis Mother   . Breast cancer Paternal Aunt   . Mental illness Father     Social History   Social History  . Marital Status: Married    Spouse Name: N/A  . Number of Children: N/A  . Years of Education: N/A   Social History Main Topics  . Smoking status: Former Smoker -- 0.00  packs/day    Quit date: 09/07/1998  . Smokeless tobacco: Never Used  . Alcohol Use: Yes     Comment: occasionally  . Drug Use: No  . Sexual Activity: Yes    Birth Control/ Protection: Pill     Comment: microgestin   Other Topics Concern  . None   Social History Narrative   Works in Therapist, art   Married     Current outpatient prescriptions:  .  HYDROcodone-acetaminophen (NORCO) 5-325 MG tablet, 1-2 tabs po q6 hours prn pain, Disp: 30 tablet, Rfl: 0 .  losartan (COZAAR) 100 MG tablet, Take 1 tablet (100 mg total) by mouth daily., Disp: 90 tablet, Rfl: 3 .  metFORMIN (GLUCOPHAGE) 500 MG tablet, Take 1 tablet (500 mg total) by mouth 2 (two) times daily with a meal., Disp: 180 tablet, Rfl: 0 .  norethindrone (HEATHER) 0.35 MG tablet, Take 1 tablet by mouth daily., Disp: , Rfl:  .  rosuvastatin (CRESTOR) 10 MG tablet, Take 1 tablet (10 mg total) by mouth daily., Disp: 90 tablet, Rfl: 0 .  sertraline (ZOLOFT) 100 MG tablet, Take 150 mg by mouth daily. , Disp: , Rfl:  .  traZODone (DESYREL) 150 MG tablet, Take 150 mg by mouth at bedtime.  , Disp: , Rfl:  .  amLODipine (NORVASC) 5 MG tablet, Take 1 tablet (5 mg total) by mouth daily., Disp: 90 tablet, Rfl: 3  EXAM:  Filed  Vitals:   12/25/15 1512  BP: 142/82  Pulse: 86  Temp: 98.9 F (37.2 C)    Body mass index is 50.09 kg/(m^2).  GENERAL: vitals reviewed and listed above, alert, oriented, appears well hydrated and in no acute distress  HEENT: atraumatic, conjunttiva clear, no obvious abnormalities on inspection of external nose and ears  NECK: no obvious masses on inspection  LUNGS: clear to auscultation bilaterally, no wheezes, rales or rhonchi, good air movement  CV: HRRR, no peripheral edema  MS: moves all extremities without noticeable abnormality  PSYCH: pleasant and cooperative, no obvious depression or anxiety  ASSESSMENT AND PLAN:  Discussed the following assessment and plan:  Essential  hypertension  Type 2 diabetes mellitus without complication, without long-term current use of insulin (HCC)  Morbid obesity, unspecified obesity type ([place])  Hyperlipemia  -Discussed risks of her elevated blood pressure, particularly in light of her other health issues, and discussed treatment options -Advised lifestyle changes including a healthy diet and regular exercise, and decided to start Norvasc after a discussion of risks -Follow up in 1-2 months -Patient advised to return or notify a doctor immediately if symptoms worsen or persist or new concerns arise.  Patient Instructions  Before you leave: Please ensure that you have follow-up with Dr. Maudie Mercury in 1-2 months.  Please start the Norvasc 5 mg daily for your blood pressure, along with continuation of all of your other medications.  We recommend the following healthy lifestyle measures: - eat a healthy whole foods diet consisting of regular small meals composed of vegetables, fruits, beans, nuts, seeds, healthy meats such as white chicken and fish and whole grains.  - avoid sweets, white starchy foods, fried foods, fast food, processed foods, sodas, red meet and other fattening foods.  - get a least 150-300 minutes of aerobic exercise per week.       Colin Benton R.

## 2015-12-25 NOTE — Progress Notes (Signed)
Pre visit review using our clinic review tool, if applicable. No additional management support is needed unless otherwise documented below in the visit note. 

## 2016-01-05 ENCOUNTER — Other Ambulatory Visit: Payer: Self-pay | Admitting: *Deleted

## 2016-01-05 MED ORDER — AMLODIPINE BESYLATE 5 MG PO TABS: 5.0000 mg | ORAL_TABLET | Freq: Every day | ORAL | Status: DC

## 2016-01-09 NOTE — Anesthesia Postprocedure Evaluation (Signed)
Anesthesia Post Note  Patient: [name]  Procedure(s) Performed: Procedure(s) (LRB): LEFT CARPAL TUNNEL RELEASE (Left)  Anesthesia Post Evaluation  Last Vitals:  Filed Vitals:   12/12/15 1500 12/12/15 1529  BP: 124/77 159/92  Pulse: 83 78  Temp:  36.8 C  Resp: 19 16    Last Pain:  Filed Vitals:   12/15/15 0922  PainSc: 0-No pain                 Krisy Dix COKER

## 2016-01-20 ENCOUNTER — Other Ambulatory Visit: Payer: Self-pay | Admitting: Family Medicine

## 2016-01-30 ENCOUNTER — Other Ambulatory Visit: Payer: Self-pay | Admitting: Family Medicine

## 2016-02-19 ENCOUNTER — Encounter: Payer: Self-pay | Admitting: Family Medicine

## 2016-02-19 ENCOUNTER — Ambulatory Visit (INDEPENDENT_AMBULATORY_CARE_PROVIDER_SITE_OTHER): Payer: 59 | Admitting: Family Medicine

## 2016-02-19 VITALS — BP 118/80 | HR 100 | Temp 97.9°F | Ht 60.0 in | Wt 256.7 lb

## 2016-02-19 DIAGNOSIS — E119 Type 2 diabetes mellitus without complications: Secondary | ICD-10-CM

## 2016-02-19 DIAGNOSIS — E785 Hyperlipidemia, unspecified: Secondary | ICD-10-CM

## 2016-02-19 DIAGNOSIS — I10 Essential (primary) hypertension: Secondary | ICD-10-CM | POA: Diagnosis not present

## 2016-02-19 MED ORDER — ROSUVASTATIN CALCIUM 10 MG PO TABS: ORAL_TABLET | ORAL | Status: DC

## 2016-02-19 NOTE — Patient Instructions (Signed)
Before you leave: -Please schedule follow-up in about 3 months  Please review her medication list that we gave me today carefully and in sure you're taking all her medications on the list.  Consider getting rid of scale if it is frustrating for you to watch her weight. Instead, focus on doing healthy activities and eating healthy every day.  We recommend the following healthy lifestyle measures: - eat a healthy whole foods diet consisting of regular small meals composed of vegetables, fruits, beans, nuts, seeds, healthy meats such as white chicken and fish and whole grains.  - avoid sweets, white starchy foods, fried foods, fast food, processed foods, sodas, red meet and other fattening foods.  - get a least 150-300 minutes of aerobic exercise per week.

## 2016-02-19 NOTE — Progress Notes (Signed)
Pre visit review using our clinic review tool, if applicable. No additional management support is needed unless otherwise documented below in the visit note. 

## 2016-02-19 NOTE — Progress Notes (Signed)
HPI:  [name] the pleasant 40 year old with a past medical history significant for morbid obesity, diabetes, hyperlipidemia and hypertension here for a follow-up. Her blood pressure was elevated at the last visit and Norvasc 5 mg daily was added on 12/25/15. Her labs were done in February and looked okay. She reports she has been doing well. She thought she was supposed to stop her Crestor and replace this with the Norvasc. She has been struggling with her weight and motivation to exercise and eat right. She reports when she looks at the scale she gets frustrated and then goes back to add a habits.  ROS: See pertinent positives and negatives per HPI.  Past Medical History  Diagnosis Date  . Anxiety   . Depression   . Non-insulin dependent type 2 diabetes mellitus ([place])   . History of kidney stones   . Chronic low back pain   . Migraines   . Tension headache, chronic   . Polycystic ovarian syndrome   . Hypertension     states under control with med., has been on med. < 9 mos.  . Carpal tunnel syndrome of left wrist 11/2015  . Cough 12/08/2015    Past Surgical History  Procedure Laterality Date  . Cesarean section  05/06/2007  . Wisdom tooth extraction    . Leep N/A 03/17/2015    Procedure: LOOP ELECTROSURGICAL EXCISION PROCEDURE (LEEP) and Cold Knife Conization;  Surgeon: Eldred Manges, MD;  Location: [facility];  Service: Gynecology;  Laterality: N/A;  . Extracorporeal shock wave lithotripsy  11/22/2011  . Carpal tunnel release Right 09/16/2015    Procedure: RIGHT CARPAL TUNNEL RELEASE;  Surgeon: Leanora Cover, MD;  Location: [place];  Service: Orthopedics;  Laterality: Right;  . Carpal tunnel release Left 12/12/2015    Procedure: LEFT CARPAL TUNNEL RELEASE;  Surgeon: Leanora Cover, MD;  Location: [place];  Service: Orthopedics;  Laterality: Left;    Family History  Problem Relation Age of Onset  . Arthritis Mother   . Breast cancer Paternal  Aunt   . Mental illness Father     Social History   Social History  . Marital Status: Married    Spouse Name: N/A  . Number of Children: N/A  . Years of Education: N/A   Social History Main Topics  . Smoking status: Former Smoker -- 0.00 packs/day    Quit date: 09/07/1998  . Smokeless tobacco: Never Used  . Alcohol Use: Yes     Comment: occasionally  . Drug Use: No  . Sexual Activity: Yes    Birth Control/ Protection: Pill     Comment: microgestin   Other Topics Concern  . None   Social History Narrative   Works in Therapist, art   Married     Current outpatient prescriptions:  .  amLODipine (NORVASC) 5 MG tablet, Take 1 tablet (5 mg total) by mouth daily., Disp: 90 tablet, Rfl: 1 .  losartan (COZAAR) 100 MG tablet, Take 1 tablet (100 mg total) by mouth daily., Disp: 90 tablet, Rfl: 3 .  metFORMIN (GLUCOPHAGE) 500 MG tablet, Take 1 tablet by mouth two  times daily with meals, Disp: 180 tablet, Rfl: 1 .  norethindrone (HEATHER) 0.35 MG tablet, Take 1 tablet by mouth daily., Disp: , Rfl:  .  sertraline (ZOLOFT) 100 MG tablet, Take 150 mg by mouth daily. , Disp: , Rfl:  .  traZODone (DESYREL) 150 MG tablet, Take 150 mg by mouth at bedtime.  ,  Disp: , Rfl:  .  rosuvastatin (CRESTOR) 10 MG tablet, Take 1 tablet by mouth  daily, Disp: 90 tablet, Rfl: 1  EXAM:  Filed Vitals:   02/19/16 1557  BP: 118/80  Pulse: 100  Temp: 97.9 F (36.6 C)    Body mass index is 50.13 kg/(m^2).  GENERAL: vitals reviewed and listed above, alert, oriented, appears well hydrated and in no acute distress  HEENT: atraumatic, conjunttiva clear, no obvious abnormalities on inspection of external nose and ears  NECK: no obvious masses on inspection  LUNGS: clear to auscultation bilaterally, no wheezes, rales or rhonchi, good air movement  CV: HRRR, no peripheral edema  MS: moves all extremities without noticeable abnormality  PSYCH: pleasant and cooperative, no obvious depression or  anxiety  ASSESSMENT AND PLAN:  Discussed the following assessment and plan:  Essential hypertension  Morbid obesity, unspecified obesity type ([place])  Type 2 diabetes mellitus without complication, without long-term current use of insulin (HCC)  Hyperlipemia  -Blood pressure looks better today -Lifestyle changes discussed at length, and suggested she not follow her weight on a scale, but rather pay attention to making steady healthy changes to her lifestyle -Discussed that muscle tissue and blood volume can skew the scale results as she exercises more and eats healthy -Asked her to restart taking her Crestor that she had stopped by mistake -Patient advised to return or notify a doctor immediately if symptoms worsen or persist or new concerns arise.  Patient Instructions  Before you leave: -Please schedule follow-up in about 3 months  Please review her medication list that we gave me today carefully and in sure you're taking all her medications on the list.  Consider getting rid of scale if it is frustrating for you to watch her weight. Instead, focus on doing healthy activities and eating healthy every day.  We recommend the following healthy lifestyle measures: - eat a healthy whole foods diet consisting of regular small meals composed of vegetables, fruits, beans, nuts, seeds, healthy meats such as white chicken and fish and whole grains.  - avoid sweets, white starchy foods, fried foods, fast food, processed foods, sodas, red meet and other fattening foods.  - get a least 150-300 minutes of aerobic exercise per week.       Colin Benton R.

## 2016-03-29 ENCOUNTER — Encounter: Payer: Self-pay | Admitting: Family Medicine

## 2016-03-29 ENCOUNTER — Ambulatory Visit (INDEPENDENT_AMBULATORY_CARE_PROVIDER_SITE_OTHER): Payer: 59 | Admitting: Family Medicine

## 2016-03-29 VITALS — BP 112/86 | HR 98 | Temp 97.5°F | Ht 61.0 in | Wt 250.0 lb

## 2016-03-29 DIAGNOSIS — E119 Type 2 diabetes mellitus without complications: Secondary | ICD-10-CM | POA: Diagnosis not present

## 2016-03-29 DIAGNOSIS — Z808 Family history of malignant neoplasm of other organs or systems: Secondary | ICD-10-CM

## 2016-03-29 DIAGNOSIS — R899 Unspecified abnormal finding in specimens from other organs, systems and tissues: Secondary | ICD-10-CM

## 2016-03-29 DIAGNOSIS — E785 Hyperlipidemia, unspecified: Secondary | ICD-10-CM

## 2016-03-29 DIAGNOSIS — Z0000 Encounter for general adult medical examination without abnormal findings: Secondary | ICD-10-CM | POA: Diagnosis not present

## 2016-03-29 DIAGNOSIS — F3342 Major depressive disorder, recurrent, in full remission: Secondary | ICD-10-CM

## 2016-03-29 DIAGNOSIS — I10 Essential (primary) hypertension: Secondary | ICD-10-CM | POA: Insufficient documentation

## 2016-03-29 LAB — LIPID PANEL
CHOL/HDL RATIO: 4
Cholesterol: 125 mg/dL (ref 0–200)
HDL: 32.1 mg/dL — ABNORMAL LOW (ref >39.00)
NONHDL: 93.01
Triglycerides: 338 mg/dL — ABNORMAL HIGH (ref 0.0–149.0)
VLDL: 67.6 mg/dL — ABNORMAL HIGH (ref 0.0–40.0)

## 2016-03-29 LAB — CBC WITH DIFFERENTIAL/PLATELET
Basophils Absolute: 0.1 10*3/uL (ref 0.0–0.1)
Basophils Relative: 0.6 % (ref 0.0–3.0)
EOS ABS: 0.2 10*3/uL (ref 0.0–0.7)
Eosinophils Relative: 2 % (ref 0.0–5.0)
HCT: 38.9 % (ref 36.0–46.0)
Hemoglobin: 12.9 g/dL (ref 12.0–15.0)
LYMPHS ABS: 2.9 10*3/uL (ref 0.7–4.0)
Lymphocytes Relative: 25 % (ref 12.0–46.0)
MCHC: 33.1 g/dL (ref 30.0–36.0)
MCV: 81.9 fl (ref 78.0–100.0)
MONOS PCT: 5.5 % (ref 3.0–12.0)
Monocytes Absolute: 0.6 10*3/uL (ref 0.1–1.0)
NEUTROS ABS: 7.7 10*3/uL (ref 1.4–7.7)
NEUTROS PCT: 66.9 % (ref 43.0–77.0)
PLATELETS: 432 10*3/uL — AB (ref 150.0–400.0)
RBC: 4.75 Mil/uL (ref 3.87–5.11)
RDW: 15.5 % (ref 11.5–15.5)
WBC: 11.5 10*3/uL — ABNORMAL HIGH (ref 4.0–10.5)

## 2016-03-29 LAB — BASIC METABOLIC PANEL
BUN: 11 mg/dL (ref 6–23)
CHLORIDE: 102 meq/L (ref 96–112)
CO2: 26 mEq/L (ref 19–32)
Calcium: 9.2 mg/dL (ref 8.4–10.5)
Creatinine, Ser: 0.67 mg/dL (ref 0.40–1.20)
GFR: 103.64 mL/min (ref >60.00)
Glucose, Bld: 108 mg/dL — ABNORMAL HIGH (ref 70–99)
POTASSIUM: 4.2 meq/L (ref 3.5–5.1)
SODIUM: 137 meq/L (ref 135–145)

## 2016-03-29 LAB — LDL CHOLESTEROL, DIRECT: Direct LDL: 44 mg/dL

## 2016-03-29 LAB — HEMOGLOBIN A1C: HEMOGLOBIN A1C: 6.3 % (ref 4.6–6.5)

## 2016-03-29 NOTE — Progress Notes (Signed)
HPI:  Here for CPE:  -Concerns and/or follow up today:   DM: -mild, no complications -on metformin for this 573m bid -denies: polyuria, polydipsia, vision changes -eye exam is due next month per her report   HTN: -meds: norvasc 527m losartan 100 -no cp, sob, doe  HLD: -crestor 10 -denies leg cramps  Depression and anxiety: -meds: zoloft, trazadone -sees Dr. CoClovis PuObesity: -Diet: working on eating more veggies and smaller portions - has lost 6 lbs in 2 months -Exercise: walking 3-4 days per week  -Taking folic acid, vitamin D or calcium: no  -Diabetes and Dyslipidemia Screening: FASTING  -Vaccines: UTD  -pap history:sees Dr. HaLeo GrosserHx hgsil; reports she is set up for gyn exam in a few weeks  -FDLMP: currently menstruating  -sexual activity: yes, female partner, no new partners  -wants STI testing (Hep C if born 1936-65 no  -FH breast, colon or ovarian ca: see FH Last mammogram: n/a Last colon cancer screening: n/a  Breast Ca Risk Assessment: -does breast health with gyn, no personal of immediate sig family hx  She made me aware today that she has a family history of melanoma. Advised skin exam and dermatology checks on a regular basis. She opted to see dermatology, but declined skin exam today. FH updated.   -Alcohol, Tobacco, drug use: see social history  Review of Systems - no fevers, unintentional weight loss, vision loss, hearing loss, chest pain, sob, hemoptysis, melena, hematochezia, hematuria, genital discharge, changing or concerning skin lesions, bleeding, bruising, loc, thoughts of self harm or SI  Past Medical History  Diagnosis Date  . Anxiety   . Depression   . [place]  . History of kidney stones   . Chronic low back pain   . Migraines   . Tension headache, chronic   . Polycystic ovarian syndrome   . Hypertension     states under control with med., has been on med. < 9 mos.  . Carpal  tunnel syndrome of left wrist 11/2015  . Cough 12/08/2015  . Hyperlipemia 12/02/2015    Past Surgical History  Procedure Laterality Date  . Cesarean section  05/06/2007  . Wisdom tooth extraction    . Leep N/A 03/17/2015    Procedure: LOOP ELECTROSURGICAL EXCISION PROCEDURE (LEEP) and Cold Knife Conization;  Surgeon: VaEldred MangesMD;  [facility];  Service: Gynecology;  Laterality: N/A;  . Extracorporeal shock wave lithotripsy  11/22/2011  . Carpal tunnel release Right 09/16/2015    Procedure: RIGHT CARPAL TUNNEL RELEASE;  Surgeon: KeLeanora CoverMD;  [place] Service: [place]opedics;  Laterality: Right;  . Carpal tunnel release Left 12/12/2015    Procedure: LEFT CARPAL TUNNEL RELEASE;  Surgeon: KeLeanora CoverMD;  [place] [facility] [place]: [place]ics;  Laterality: Left;    Family History  Problem Relation Age of Onset  . Arthritis Mother   . Breast cancer Paternal Aunt   . Mental illness Father   . Melanoma Father     Social History   Social History  . Marital Status: Married    Spouse Name: N/A  . Number of Children: N/A  . Years of Education: N/A   Social History Main Topics  . Smoking status: Former Smoker -- 0.00 packs/day    Quit date: 09/07/1998  . Smokeless tobacco: Never Used  . Alcohol Use: Yes     Comment: occasionally  . Drug Use: No  . Sexual  Activity: Yes    Birth Control/ Protection: Pill     Comment: microgestin   Other Topics Concern  . None   Social History Narrative   Works in Therapist, art   Married     Current outpatient prescriptions:  .  amLODipine (NORVASC) 5 MG tablet, Take 1 tablet (5 mg total) by mouth daily., Disp: 90 tablet, Rfl: 1 .  losartan (COZAAR) 100 MG tablet, Take 1 tablet (100 mg total) by mouth daily., Disp: 90 tablet, Rfl: 3 .  metFORMIN (GLUCOPHAGE) 500 MG tablet, Take 1 tablet by mouth two  times daily with meals, Disp: 180 tablet, Rfl: 1 .  norethindrone (HEATHER)  0.35 MG tablet, Take 1 tablet by mouth daily., Disp: , Rfl:  .  rosuvastatin (CRESTOR) 10 MG tablet, Take 1 tablet by mouth  daily, Disp: 90 tablet, Rfl: 1 .  sertraline (ZOLOFT) 100 MG tablet, Take 150 mg by mouth daily. , Disp: , Rfl:  .  traZODone (DESYREL) 150 MG tablet, Take 150 mg by mouth at bedtime.  , Disp: , Rfl:   EXAM:  Filed Vitals:   03/29/16 0818  BP: 112/86  Pulse: 98  Temp: 97.5 F (36.4 C)    GENERAL: vitals reviewed and listed below, alert, oriented, appears well hydrated and in no acute distress  HEENT: head atraumatic, PERRLA, normal appearance of eyes, ears, nose and mouth. moist mucus membranes.  NECK: supple, no masses or lymphadenopathy  LUNGS: clear to auscultation bilaterally, no rales, rhonchi or wheeze  CV: HRRR, no peripheral edema or cyanosis, normal pedal pulses  BREAST: declined  ABDOMEN: bowel sounds normal, soft, non tender to palpation, no masses, no rebound or guarding  GU: declined  SKIN: no rash or abnormal lesions; she declined a full skin exam today as she will be seeing dermatology for this  MS: normal gait, moves all extremities normally  NEURO: CN II-XII grossly intact, normal muscle strength and sensation to light touch on extremities  PSYCH: normal affect, pleasant and cooperative  ASSESSMENT AND PLAN:  Discussed the following assessment and plan: Visit for preventive health examination  Essential hypertension - Plan: Basic metabolic panel  Morbid obesity, unspecified obesity type ([place])  Hyperlipemia - Plan: Lipid Panel  Type 2 diabetes mellitus without complication, without long-term current use of insulin (HCC) - Plan: Hemoglobin A1c, CBC with Differential/Platelets  Recurrent major depressive disorder, in full remission ([place])  FH: melanoma   -Discussed and advised all [place] preventive services health task force level A and B recommendations for age, sex and risks.  -advised good sun protection and dermatology  exams and management for FH melanoma, she declined skin exam here today  -Advised at least 150 minutes of exercise per week and a healthy diet low in saturated fats and sweets and consisting of fresh fruits and vegetables, lean meats such as fish and white chicken and whole grains.  -FASTING labs, studies and vaccines per orders this encounter  Orders Placed This Encounter  Procedures  . Lipid Panel  . Hemoglobin A1c  . Basic metabolic panel  . CBC with Differential/Platelets    Patient advised to return to clinic immediately if symptoms worsen or persist or new concerns.  Patient Instructions  BEFORE YOU LEAVE: -labs -follow up in 4 months  Vit D [phone] IU daily  Make sure to get your skin and gynecology exams in the next few months  We recommend the following healthy lifestyle measures: - eat a healthy whole foods diet consisting of regular  small meals composed of vegetables, fruits, beans, nuts, seeds, healthy meats such as white chicken and fish and whole grains.  - avoid sweets, white starchy foods, fried foods, fast food, processed foods, sodas, red meet and other fattening foods.  - get a least 150-300 minutes of aerobic exercise per week.   We have ordered labs or studies at this visit. It can take up to 1-2 weeks for results and processing. IF results require follow up or explanation, we will call you with instructions. Clinically stable results will be released to your [facility]. If you have not heard from [place] or cannot find your results in [facility] in 2 weeks please contact our office at [phone].  If you are not yet signed up for [facility], please consider signing up.           No Follow-up on file.  Colin Benton R. '

## 2016-03-29 NOTE — Patient Instructions (Addendum)
BEFORE YOU LEAVE: -labs -follow up in 4 months  Vit D [phone] IU daily  Make sure to get your skin and gynecology exams in the next few months  We recommend the following healthy lifestyle measures: - eat a healthy whole foods diet consisting of regular small meals composed of vegetables, fruits, beans, nuts, seeds, healthy meats such as white chicken and fish and whole grains.  - avoid sweets, white starchy foods, fried foods, fast food, processed foods, sodas, red meet and other fattening foods.  - get a least 150-300 minutes of aerobic exercise per week.   We have ordered labs or studies at this visit. It can take up to 1-2 weeks for results and processing. IF results require follow up or explanation, we will call you with instructions. Clinically stable results will be released to your [facility]. If you have not heard from [place] or cannot find your results in [facility] in 2 weeks please contact our office at [phone].  If you are not yet signed up for [facility], please consider signing up.

## 2016-03-29 NOTE — Addendum Note (Signed)
Addended by: Agnes Lawrence on: 03/29/2016 03:45 PM   Modules accepted: Orders

## 2016-03-29 NOTE — Progress Notes (Signed)
Pre visit review using our clinic review tool, if applicable. No additional management support is needed unless otherwise documented below in the visit note. 

## 2016-04-28 ENCOUNTER — Other Ambulatory Visit: Payer: 59

## 2016-05-05 LAB — HM DIABETES EYE EXAM

## 2016-05-10 ENCOUNTER — Other Ambulatory Visit (INDEPENDENT_AMBULATORY_CARE_PROVIDER_SITE_OTHER): Payer: 59

## 2016-05-10 DIAGNOSIS — R7989 Other specified abnormal findings of blood chemistry: Secondary | ICD-10-CM

## 2016-05-10 DIAGNOSIS — D649 Anemia, unspecified: Secondary | ICD-10-CM | POA: Diagnosis not present

## 2016-05-10 DIAGNOSIS — D72829 Elevated white blood cell count, unspecified: Secondary | ICD-10-CM

## 2016-05-11 ENCOUNTER — Telehealth: Payer: Self-pay | Admitting: Family Medicine

## 2016-05-11 LAB — CBC WITH DIFFERENTIAL/PLATELET
BASOS ABS: 0.2 10*3/uL — AB (ref 0.0–0.1)
Basophils Relative: 1.8 % (ref 0.0–3.0)
EOS ABS: 0.3 10*3/uL (ref 0.0–0.7)
Eosinophils Relative: 2.3 % (ref 0.0–5.0)
HEMATOCRIT: 36.5 % (ref 36.0–46.0)
Hemoglobin: 12 g/dL (ref 12.0–15.0)
LYMPHS PCT: 27.5 % (ref 12.0–46.0)
Lymphs Abs: 3.5 10*3/uL (ref 0.7–4.0)
MCHC: 32.8 g/dL (ref 30.0–36.0)
MCV: 82.7 fl (ref 78.0–100.0)
MONOS PCT: 3.8 % (ref 3.0–12.0)
Monocytes Absolute: 0.5 10*3/uL (ref 0.1–1.0)
NEUTROS ABS: 8.2 10*3/uL — AB (ref 1.4–7.7)
Neutrophils Relative %: 64.6 % (ref 43.0–77.0)
PLATELETS: 430 10*3/uL — AB (ref 150.0–400.0)
RBC: 4.41 Mil/uL (ref 3.87–5.11)
RDW: 15.4 % (ref 11.5–15.5)
WBC: 12.7 10*3/uL — AB (ref 4.0–10.5)

## 2016-05-11 NOTE — Telephone Encounter (Signed)
See results note. 

## 2016-05-11 NOTE — Telephone Encounter (Signed)
Pt returned your call.  

## 2016-05-18 ENCOUNTER — Encounter: Payer: Self-pay | Admitting: Hematology and Oncology

## 2016-05-18 ENCOUNTER — Telehealth: Payer: Self-pay | Admitting: Hematology and Oncology

## 2016-05-18 NOTE — Anesthesia Postprocedure Evaluation (Signed)
Anesthesia Post Note  Patient: [name]  Procedure(s) Performed: Procedure(s) (LRB): LEFT CARPAL TUNNEL RELEASE (Left)  Patient location during evaluation: PACU Anesthesia Type: General Level of consciousness: awake, awake and alert and oriented Pain management: pain level controlled Vital Signs Assessment: post-procedure vital signs reviewed and stable Respiratory status: spontaneous breathing, nonlabored ventilation and respiratory function stable Cardiovascular status: blood pressure returned to baseline Anesthetic complications: no    Last Vitals:  Vitals:   12/12/15 1500 12/12/15 1529  BP: 124/77 (!) 159/92  Pulse: 83 78  Resp: 19 16  Temp:  36.8 C    Last Pain:  Vitals:   12/15/15 0922  TempSrc:   PainSc: 0-No pain                 Ladarrion Telfair COKER

## 2016-05-18 NOTE — Telephone Encounter (Signed)
Telephone call from patient. Appointment schedule for 8/11 at 2pm with [facility]. Demographics verified. Letter to referring.

## 2016-05-20 ENCOUNTER — Other Ambulatory Visit: Payer: Self-pay | Admitting: Obstetrics and Gynecology

## 2016-05-20 ENCOUNTER — Ambulatory Visit: Payer: 59 | Admitting: Family Medicine

## 2016-06-01 ENCOUNTER — Other Ambulatory Visit: Payer: Self-pay | Admitting: Family Medicine

## 2016-06-04 ENCOUNTER — Encounter: Payer: Self-pay | Admitting: Hematology and Oncology

## 2016-06-04 ENCOUNTER — Telehealth: Payer: Self-pay | Admitting: Hematology and Oncology

## 2016-06-04 ENCOUNTER — Ambulatory Visit (HOSPITAL_BASED_OUTPATIENT_CLINIC_OR_DEPARTMENT_OTHER): Payer: 59 | Admitting: Hematology and Oncology

## 2016-06-04 VITALS — BP 146/75 | HR 98 | Temp 97.9°F | Resp 19 | Wt 255.2 lb

## 2016-06-04 DIAGNOSIS — D72829 Elevated white blood cell count, unspecified: Secondary | ICD-10-CM | POA: Diagnosis not present

## 2016-06-04 DIAGNOSIS — D473 Essential (hemorrhagic) thrombocythemia: Secondary | ICD-10-CM | POA: Diagnosis not present

## 2016-06-04 DIAGNOSIS — R7989 Other specified abnormal findings of blood chemistry: Secondary | ICD-10-CM

## 2016-06-04 DIAGNOSIS — D75838 Other thrombocytosis: Secondary | ICD-10-CM

## 2016-06-04 NOTE — Telephone Encounter (Signed)
Gave pt cal & avs °

## 2016-06-04 NOTE — Assessment & Plan Note (Signed)
She has mild thrombocytosis for 3 years. I suspect it is reactive in nature. I recommend aspirin therapy. We also discussed rare possibility of myeloproliferative disorder and she agrees to have her blood drawn in the next visit in 3 months.

## 2016-06-04 NOTE — Assessment & Plan Note (Signed)
She has chronic leukocytosis for 9 years. The pattern is suggestive of reactive leukocytosis, possibly from stress. I recommend observation only.

## 2016-06-04 NOTE — Progress Notes (Signed)
[facility] NOTE  Patient Care Team: Lucretia Kern, DO as PCP - General (Family Medicine)  CHIEF COMPLAINTS/PURPOSE OF CONSULTATION:  Mild thrombocytosis and chronic leukocytosis  HISTORY OF PRESENTING ILLNESS:  [name] 40 y.o. female is here because of elevated WBC and chronic elevated platelet count.  She was found to have abnormal CBC from routine blood work monitoring. She has leukocytosis ranging from 11.5 to 14 over the last 9 years. She is noted to have mild thrombocytosis since 2014. She denies prior diagnosis of blood clots She denies recent infection. The last prescription antibiotics was more than 3 months ago There is not reported symptoms of sinus congestion, cough, urinary frequency/urgency or dysuria, diarrhea, joint swelling/pain or abnormal skin rash.  She had no prior history or diagnosis of cancer. Her age appropriate screening programs are up-to-date. The patient has no prior diagnosis of autoimmune disease and was not prescribed corticosteroids related products.  The patient is not a smoker.  MEDICAL HISTORY:  Past Medical History:  Diagnosis Date  . Anxiety   . Carpal tunnel syndrome of left wrist 11/2015  . Chronic low back pain   . Cough 12/08/2015  . Depression   . History of kidney stones   . Hyperlipemia 12/02/2015  . Hypertension    states under control with med., has been on med. < 9 mos.  . Migraines   . Non-insulin dependent type 2 diabetes mellitus ([place])   . Polycystic ovarian syndrome   . Tension headache, chronic     SURGICAL HISTORY: Past Surgical History:  Procedure Laterality Date  . CARPAL TUNNEL RELEASE Right 09/16/2015   Procedure: RIGHT CARPAL TUNNEL RELEASE;  Surgeon: Leanora Cover, MD;  Location: [place];  Service: Orthopedics;  Laterality: Right;  . CARPAL TUNNEL RELEASE Left 12/12/2015   Procedure: LEFT CARPAL TUNNEL RELEASE;  Surgeon: Leanora Cover, MD;  Location: [place];  Service: Orthopedics;  Laterality: Left;  . CESAREAN SECTION  05/06/2007  . EXTRACORPOREAL SHOCK WAVE LITHOTRIPSY  11/22/2011  . LEEP N/A 03/17/2015   Procedure: LOOP ELECTROSURGICAL EXCISION PROCEDURE (LEEP) and Cold Knife Conization;  Surgeon: Eldred Manges, MD;  Location: [facility];  Service: Gynecology;  Laterality: N/A;  . WISDOM TOOTH EXTRACTION      SOCIAL HISTORY: Social History   Social History  . Marital status: Married    Spouse name: N/A  . Number of children: 1  . Years of education: N/A   Occupational History  . 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 Smoking status: Former Smoker    Packs/day: 0.00    Quit date: 09/07/1998  . Smokeless tobacco: Never Used  . Alcohol use Yes     Comment: occasionally  . Drug use: No  . Sexual activity: Yes    Birth control/ protection: Pill     Comment: microgestin   Other Topics Concern  . Not on file   Social History Narrative   Works in Therapist, art   Married    FAMILY HISTORY: Family History  Problem Relation Age of Onset  . Arthritis Mother   . Breast cancer Paternal Aunt   . Mental illness Father   . Melanoma Father     ALLERGIES:  has No Known Allergies.  MEDICATIONS:  Current Outpatient Prescriptions  Medication Sig Dispense Refill  . amLODipine (NORVASC) 5 MG tablet Take 1 tablet by mouth  daily 90 tablet 2  . losartan (COZAAR)  100 MG tablet Take 1 tablet (100 mg total) by mouth daily. 90 tablet 3  . metFORMIN (GLUCOPHAGE) 500 MG tablet Take 1 tablet by mouth two  times daily with meals 180 tablet 1  . norethindrone (HEATHER) 0.35 MG tablet Take 1 tablet by mouth daily.    . rosuvastatin (CRESTOR) 10 MG tablet Take 1 tablet by mouth  daily 90 tablet 1  . sertraline (ZOLOFT) 100 MG tablet Take 150 mg by mouth daily.     . traZODone (DESYREL) 150 MG tablet Take 150 mg by mouth at bedtime.       No current facility-administered medications for this visit.     REVIEW  OF SYSTEMS:   Constitutional: Denies fevers, chills or abnormal night sweats Eyes: Denies blurriness of vision, double vision or watery eyes Ears, nose, mouth, throat, and face: Denies mucositis or sore throat Respiratory: Denies cough, dyspnea or wheezes Cardiovascular: Denies palpitation, chest discomfort or lower extremity swelling Gastrointestinal:  Denies nausea, heartburn or change in bowel habits Skin: Denies abnormal skin rashes Lymphatics: Denies new lymphadenopathy or easy bruising Neurological:Denies numbness, tingling or new weaknesses Behavioral/Psych: Mood is stable, no new changes  All other systems were reviewed with the patient and are negative.  PHYSICAL EXAMINATION: ECOG PERFORMANCE STATUS: 0 - Asymptomatic  Vitals:   06/04/16 1345  BP: (!) 146/75  Pulse: 98  Resp: 19  Temp: 97.9 F (36.6 C)   Filed Weights   06/04/16 1345  Weight: 255 lb 3.2 oz (115.8 kg)    GENERAL:alert, no distress and comfortable. She is morbidly obese SKIN: skin color, texture, turgor are normal, no rashes or significant lesions EYES: normal, conjunctiva are pink and non-injected, sclera clear OROPHARYNX:no exudate, no erythema and lips, buccal mucosa, and tongue normal  NECK: supple, thyroid normal size, non-tender, without nodularity LYMPH:  no palpable lymphadenopathy in the cervical, axillary or inguinal LUNGS: clear to auscultation and percussion with normal breathing effort HEART: regular rate & rhythm and no murmurs and no lower extremity edema ABDOMEN:abdomen soft, non-tender and normal bowel sounds Musculoskeletal:no cyanosis of digits and no clubbing  PSYCH: alert & oriented x 3 with fluent speech NEURO: no focal motor/sensory deficits  LABORATORY DATA:  I have reviewed the data as listed Recent Results (from the past 2160 hour(s))  Lipid Panel     Status: Abnormal   Collection Time: 03/29/16  8:53 AM  Result Value Ref Range   Cholesterol 125 0 - 200 mg/dL     Comment: ATP III Classification       Desirable:  < 200 mg/dL               Borderline High:  200 - 239 mg/dL          High:  > = 240 mg/dL   Triglycerides 338.0 (H) 0.0 - 149.0 mg/dL    Comment: Normal:  <150 mg/dLBorderline High:  150 - 199 mg/dL   HDL 32.10 (L) >39.00 mg/dL   VLDL 67.6 (H) 0.0 - 40.0 mg/dL   Total CHOL/HDL Ratio 4     Comment:                Men          Women1/2 Average Risk     3.4          3.3Average Risk          5.0          4.42X Average Risk  9.6          7.13X Average Risk          15.0          11.0                       NonHDL 93.01     Comment: NOTE:  Non-HDL goal should be 30 mg/dL higher than patient's LDL goal (i.e. LDL goal of < 70 mg/dL, would have non-HDL goal of < 100 mg/dL)  Hemoglobin A1c     Status: None   Collection Time: 03/29/16  8:53 AM  Result Value Ref Range   Hgb A1c MFr Bld 6.3 4.6 - 6.5 %    Comment: Glycemic Control Guidelines for People with Diabetes:Non Diabetic:  <6%Goal of Therapy: <7%Additional Action Suggested:  >3%   Basic metabolic panel     Status: Abnormal   Collection Time: 03/29/16  8:53 AM  Result Value Ref Range   Sodium 137 135 - 145 mEq/L   Potassium 4.2 3.5 - 5.1 mEq/L   Chloride 102 96 - 112 mEq/L   CO2 26 19 - 32 mEq/L   Glucose, Bld 108 (H) 70 - 99 mg/dL   BUN 11 6 - 23 mg/dL   Creatinine, Ser 0.67 0.40 - 1.20 mg/dL   Calcium 9.2 8.4 - 10.5 mg/dL   GFR 103.64 >60.00 mL/min  CBC with Differential/Platelets     Status: Abnormal   Collection Time: 03/29/16  8:53 AM  Result Value Ref Range   WBC 11.5 (H) 4.0 - 10.5 K/uL   RBC 4.75 3.87 - 5.11 Mil/uL   Hemoglobin 12.9 12.0 - 15.0 g/dL   HCT 38.9 36.0 - 46.0 %   MCV 81.9 78.0 - 100.0 fl   MCHC 33.1 30.0 - 36.0 g/dL   RDW 15.5 11.5 - 15.5 %   Platelets 432.0 (H) 150.0 - 400.0 K/uL   Neutrophils Relative % 66.9 43.0 - 77.0 %   Lymphocytes Relative 25.0 12.0 - 46.0 %   Monocytes Relative 5.5 3.0 - 12.0 %   Eosinophils Relative 2.0 0.0 - 5.0 %   Basophils  Relative 0.6 0.0 - 3.0 %   Neutro Abs 7.7 1.4 - 7.7 K/uL   Lymphs Abs 2.9 0.7 - 4.0 K/uL   Monocytes Absolute 0.6 0.1 - 1.0 K/uL   Eosinophils Absolute 0.2 0.0 - 0.7 K/uL   Basophils Absolute 0.1 0.0 - 0.1 K/uL  LDL cholesterol, direct     Status: None   Collection Time: 03/29/16  8:53 AM  Result Value Ref Range   Direct LDL 44.0 mg/dL    Comment: Optimal:  <100 mg/dLNear or Above Optimal:  100-129 mg/dLBorderline High:  130-159 mg/dLHigh:  160-189 mg/dLVery High:  >190 mg/dL  CBC with Differential/Platelet     Status: Abnormal   Collection Time: 05/10/16  4:03 PM  Result Value Ref Range   WBC 12.7 (H) 4.0 - 10.5 K/uL   RBC 4.41 3.87 - 5.11 Mil/uL   Hemoglobin 12.0 12.0 - 15.0 g/dL   HCT 36.5 36.0 - 46.0 %   MCV 82.7 78.0 - 100.0 fl   MCHC 32.8 30.0 - 36.0 g/dL   RDW 15.4 11.5 - 15.5 %   Platelets 430.0 (H) 150.0 - 400.0 K/uL   Neutrophils Relative % 64.6 43.0 - 77.0 %   Lymphocytes Relative 27.5 12.0 - 46.0 %   Monocytes Relative 3.8 3.0 - 12.0 %   Eosinophils Relative 2.3 0.0 -  5.0 %   Basophils Relative 1.8 0.0 - 3.0 %   Neutro Abs 8.2 (H) 1.4 - 7.7 K/uL   Lymphs Abs 3.5 0.7 - 4.0 K/uL   Monocytes Absolute 0.5 0.1 - 1.0 K/uL   Eosinophils Absolute 0.3 0.0 - 0.7 K/uL   Basophils Absolute 0.2 (H) 0.0 - 0.1 K/uL   ASSESSMENT & PLAN  Leukocytosis She has chronic leukocytosis for 9 years. The pattern is suggestive of reactive leukocytosis, possibly from stress. I recommend observation only.  Reactive thrombocytosis She has mild thrombocytosis for 3 years. I suspect it is reactive in nature. I recommend aspirin therapy. We also discussed rare possibility of myeloproliferative disorder and she agrees to have her blood drawn in the next visit in 3 months.   Orders Placed This Encounter  Procedures  . CBC with Differential/Platelet    Standing Status:   Future    Standing Expiration Date:   07/09/2017  . Ferritin    Standing Status:   Future    Standing Expiration Date:    07/09/2017  . Sedimentation rate    Standing Status:   Future    Standing Expiration Date:   07/09/2017  . JAK2 V617F, Rfx CALR/E12/MPL    Standing Status:   Future    Standing Expiration Date:   06/04/2017    All questions were answered. The patient knows to call the clinic with any problems, questions or concerns. I spent 30 minutes counseling the patient face to face. The total time spent in the appointment was 40 minutes and more than 50% was on counseling.     [facility], [place], MD 06/04/2016 4:05 PM

## 2016-06-07 ENCOUNTER — Other Ambulatory Visit: Payer: Self-pay | Admitting: Hematology and Oncology

## 2016-06-11 ENCOUNTER — Other Ambulatory Visit: Payer: Self-pay | Admitting: Hematology and Oncology

## 2016-06-11 DIAGNOSIS — D473 Essential (hemorrhagic) thrombocythemia: Secondary | ICD-10-CM | POA: Insufficient documentation

## 2016-07-09 ENCOUNTER — Other Ambulatory Visit: Payer: Self-pay | Admitting: Family Medicine

## 2016-07-20 ENCOUNTER — Other Ambulatory Visit: Payer: Self-pay | Admitting: Family Medicine

## 2016-08-01 NOTE — Progress Notes (Signed)
HPI:  DM: -mild, no complications -on metformin for this 563m bid -denies: polyuria, polydipsia, vision changes -eye exam done per pt and normal with [facility] 04/24/16  HTN: -meds: norvasc 552m losartan 100 -no cp, sob, doe  HLD: -crestor 10 -denies leg cramps  Depression and anxiety: -meds: zoloft, trazadone -sees Dr. CoClovis Pudoing well, reports does better when busy  Obesity: -wt 250 6/17 --> 259 today -Diet:  Not doing well since school back in - busy schedule, eats out several days per week -Exercise:not good since school started  Thrombocytosis: -s/p eval with heme/onc 8/17, presumed reactive -asa advised along with follow up   ROS: See pertinent positives and negatives per HPI.  Past Medical History:  Diagnosis Date  . Anxiety   . Carpal tunnel syndrome of left wrist 11/2015  . Chronic low back pain   . Cough 12/08/2015  . Depression   . History of kidney stones   . Hyperlipemia 12/02/2015  . Hypertension    states under control with med., has been on med. < 9 mos.  . Migraines   . Non-insulin dependent type 2 diabetes mellitus ([place]  . Polycystic ovarian syndrome   . Tension headache, chronic     Past Surgical History:  Procedure Laterality Date  . CARPAL TUNNEL RELEASE Right 09/16/2015   Procedure: RIGHT CARPAL TUNNEL RELEASE;  Surgeon: KeLeanora CoverMD;  [place];  Laterality: Right;  . CARPAL TUNNEL RELEASE Left 12/12/2015   Procedure: LEFT CARPAL TUNNEL RELEASE;  Surgeon: KeLeanora CoverMD;  [place] Service: [place]ics;  Laterality: Left;  . CESAREAN SECTION  05/06/2007  . EXTRACORPOREAL SHOCK WAVE LITHOTRIPSY  11/22/2011  . LEEP N/A 03/17/2015   Procedure: LOOP ELECTROSURGICAL EXCISION PROCEDURE (LEEP) and Cold Knife Conization;  Surgeon: VaEldred MangesMD;  [facility];  Service: Gynecology;  Laterality: N/A;  . WISDOM TOOTH EXTRACTION       Family History  Problem Relation Age of Onset  . Arthritis Mother   . Breast cancer Paternal Aunt   . Mental illness Father   . Melanoma Father     Social History   Social History  . Marital status: Married    Spouse name: N/A  . Number of children: 1  . Years of education: N/A   Occupational History  . 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 Smoking status: Former Smoker    Packs/day: 0.00    Quit date: 09/07/1998  . Smokeless tobacco: Never Used  . Alcohol use Yes     Comment: occasionally  . Drug use: No  . Sexual activity: Yes    Birth control/ protection: Pill     Comment: microgestin   Other Topics Concern  . None   Social History Narrative   Works in cuTherapist, art Married     Current Outpatient Prescriptions:  .  amLODipine (NORVASC) 5 MG tablet, Take 1 tablet by mouth  daily, Disp: 90 tablet, Rfl: 2 .  losartan (COZAAR) 100 MG tablet, Take 1 tablet (100 mg total) by mouth daily., Disp: 90 tablet, Rfl: 3 .  metFORMIN (GLUCOPHAGE) 500 MG tablet, TAKE 1 TABLET BY MOUTH TWO  TIMES DAILY WITH MEALS, Disp: 180 tablet, Rfl: 2 .  norethindrone (HEATHER) 0.35 MG tablet, Take 1 tablet by mouth daily., Disp: , Rfl:  .  rosuvastatin (CRESTOR) 10 MG tablet, Take 1 tablet by mouth  daily,  Disp: 90 tablet, Rfl: 1 .  sertraline (ZOLOFT) 100 MG tablet, Take 150 mg by mouth daily. , Disp: , Rfl:  .  traZODone (DESYREL) 150 MG tablet, Take 150 mg by mouth at bedtime.  , Disp: , Rfl:   EXAM:  Vitals:   08/02/16 0833  BP: 112/80  Pulse: 89  Temp: 97.9 F (36.6 C)    Body mass index is 49.11 kg/m.  GENERAL: vitals reviewed and listed above, alert, oriented, appears well hydrated and in no acute distress  HEENT: atraumatic, conjunttiva clear, no obvious abnormalities on inspection of external nose and ears  NECK: no obvious masses on inspection  LUNGS: clear to auscultation bilaterally, no wheezes, rales or rhonchi, good air  movement  CV: HRRR, no peripheral edema  MS: moves all extremities without noticeable abnormality  PSYCH: pleasant and cooperative, no obvious depression or anxiety  ASSESSMENT AND PLAN:  Discussed the following assessment and plan:  Type 2 diabetes mellitus without complication, without long-term current use of insulin (HCC)  Morbid obesity (HCC)  Essential thrombocytosis (HCC)  Recurrent major depressive disorder, in full remission ([place])  -lifestyle recs discussed at length - discussed healthy options for eating out, discussed ways to fit in exercise -cont current meds -f/u with onc as planned -flu shot -3 mo f/u -Patient advised to return or notify a doctor immediately if symptoms worsen or persist or new concerns arise.  Patient Instructions  BEFORE YOU LEAVE: -flu shot -obtain/update eye exam -follow up: 3 months   We recommend the following healthy lifestyle for LIFE: 1) Small portions.   Tip: eat off of a salad plate instead of a dinner plate.  Tip: It is ok to feel hungry after a meal - that likely means you ate an appropriate portion.  Tip: if you need more or a snack choose fruits, veggies and/or a handful of nuts or seeds.  2) Eat a healthy clean diet.  * Tip: Avoid (less then 1 serving per week): processed foods, sweets, sweetened drinks, white starches (rice, flour, bread, potatoes, pasta, etc), red meat, fast foods, butter  *Tip: CHOOSE instead   * 5-9 servings per day of fresh or frozen fruits and vegetables (but not corn, potatoes, bananas, canned or dried fruit)   *nuts and seeds, beans   *olives and olive oil   *small portions of lean meats such as fish and white chicken    *small portions of whole grains  3)Get at least 150 minutes of sweaty aerobic exercise per week.  4)Reduce stress - consider counseling, meditation and relaxation to balance other aspects of your life.     Colin Benton R., DO

## 2016-08-02 ENCOUNTER — Encounter: Payer: Self-pay | Admitting: Family Medicine

## 2016-08-02 ENCOUNTER — Ambulatory Visit (INDEPENDENT_AMBULATORY_CARE_PROVIDER_SITE_OTHER): Payer: 59 | Admitting: Family Medicine

## 2016-08-02 VITALS — BP 112/80 | HR 89 | Temp 97.9°F | Ht 61.0 in | Wt 259.9 lb

## 2016-08-02 DIAGNOSIS — D473 Essential (hemorrhagic) thrombocythemia: Secondary | ICD-10-CM

## 2016-08-02 DIAGNOSIS — F3342 Major depressive disorder, recurrent, in full remission: Secondary | ICD-10-CM

## 2016-08-02 DIAGNOSIS — Z23 Encounter for immunization: Secondary | ICD-10-CM

## 2016-08-02 DIAGNOSIS — E119 Type 2 diabetes mellitus without complications: Secondary | ICD-10-CM

## 2016-08-02 NOTE — Patient Instructions (Signed)
BEFORE YOU LEAVE: -flu shot -obtain/update eye exam -follow up: 3 months   We recommend the following healthy lifestyle for LIFE: 1) Small portions.   Tip: eat off of a salad plate instead of a dinner plate.  Tip: It is ok to feel hungry after a meal - that likely means you ate an appropriate portion.  Tip: if you need more or a snack choose fruits, veggies and/or a handful of nuts or seeds.  2) Eat a healthy clean diet.  * Tip: Avoid (less then 1 serving per week): processed foods, sweets, sweetened drinks, white starches (rice, flour, bread, potatoes, pasta, etc), red meat, fast foods, butter  *Tip: CHOOSE instead   * 5-9 servings per day of fresh or frozen fruits and vegetables (but not corn, potatoes, bananas, canned or dried fruit)   *nuts and seeds, beans   *olives and olive oil   *small portions of lean meats such as fish and white chicken    *small portions of whole grains  3)Get at least 150 minutes of sweaty aerobic exercise per week.  4)Reduce stress - consider counseling, meditation and relaxation to balance other aspects of your life.

## 2016-08-02 NOTE — Progress Notes (Signed)
Pre visit review using our clinic review tool, if applicable. No additional management support is needed unless otherwise documented below in the visit note. 

## 2016-08-03 ENCOUNTER — Encounter: Payer: Self-pay | Admitting: Family Medicine

## 2016-08-30 ENCOUNTER — Other Ambulatory Visit (HOSPITAL_BASED_OUTPATIENT_CLINIC_OR_DEPARTMENT_OTHER): Payer: 59

## 2016-08-30 ENCOUNTER — Ambulatory Visit (HOSPITAL_BASED_OUTPATIENT_CLINIC_OR_DEPARTMENT_OTHER): Payer: 59 | Admitting: Hematology and Oncology

## 2016-08-30 ENCOUNTER — Encounter: Payer: Self-pay | Admitting: Hematology and Oncology

## 2016-08-30 DIAGNOSIS — D473 Essential (hemorrhagic) thrombocythemia: Secondary | ICD-10-CM

## 2016-08-30 DIAGNOSIS — R7989 Other specified abnormal findings of blood chemistry: Secondary | ICD-10-CM

## 2016-08-30 DIAGNOSIS — D72829 Elevated white blood cell count, unspecified: Secondary | ICD-10-CM

## 2016-08-30 DIAGNOSIS — D75838 Other thrombocytosis: Secondary | ICD-10-CM

## 2016-08-30 LAB — CBC WITH DIFFERENTIAL/PLATELET
BASO%: 0.3 % (ref 0.0–2.0)
BASOS ABS: 0 10*3/uL (ref 0.0–0.1)
EOS ABS: 0.3 10*3/uL (ref 0.0–0.5)
EOS%: 2 % (ref 0.0–7.0)
HCT: 36.3 % (ref 34.8–46.6)
HEMOGLOBIN: 12.2 g/dL (ref 11.6–15.9)
LYMPH%: 30.3 % (ref 14.0–49.7)
MCH: 27.4 pg (ref 25.1–34.0)
MCHC: 33.6 g/dL (ref 31.5–36.0)
MCV: 81.6 fL (ref 79.5–101.0)
MONO#: 0.7 10*3/uL (ref 0.1–0.9)
MONO%: 5.6 % (ref 0.0–14.0)
NEUT#: 8.1 10*3/uL — ABNORMAL HIGH (ref 1.5–6.5)
NEUT%: 61.8 % (ref 38.4–76.8)
Platelets: 355 10*3/uL (ref 145–400)
RBC: 4.45 10*6/uL (ref 3.70–5.45)
RDW: 14.9 % — AB (ref 11.2–14.5)
WBC: 13.1 10*3/uL — ABNORMAL HIGH (ref 3.9–10.3)
lymph#: 4 10*3/uL — ABNORMAL HIGH (ref 0.9–3.3)

## 2016-08-30 NOTE — Progress Notes (Signed)
[facility] OFFICE PROGRESS NOTE  Patricia Benton R., DO SUMMARY OF HEMATOLOGIC HISTORY:  Mild thrombocytosis and chronic leukocytosis  HISTORY OF PRESENTING ILLNESS:  [name] 40 y.o. female is here because of elevated WBC and chronic elevated platelet count.  She was found to have abnormal CBC from routine blood work monitoring. She has leukocytosis ranging from 11.5 to 14 over the last 9 years. She is noted to have mild thrombocytosis since 2014. She denies prior diagnosis of blood clots She denies recent infection. The last prescription antibiotics was more than 3 months ago There is not reported symptoms of sinus congestion, cough, urinary frequency/urgency or dysuria, diarrhea, joint swelling/pain or abnormal skin rash.  She had no prior history or diagnosis of cancer. Her age appropriate screening programs are up-to-date. The patient has no prior diagnosis of autoimmune disease and was not prescribed corticosteroids related products.  The patient is not a smoker. INTERVAL HISTORY: [name] 40 y.o. female returns for follow-up She feels well Denies recent infection  I have reviewed the past medical history, past surgical history, social history and family history with the patient and they are unchanged from previous note.  ALLERGIES:  has No Known Allergies.  MEDICATIONS:  Current Outpatient Prescriptions  Medication Sig Dispense Refill  . amLODipine (NORVASC) 5 MG tablet Take 1 tablet by mouth  daily 90 tablet 2  . losartan (COZAAR) 100 MG tablet Take 1 tablet (100 mg total) by mouth daily. 90 tablet 3  . metFORMIN (GLUCOPHAGE) 500 MG tablet TAKE 1 TABLET BY MOUTH TWO  TIMES DAILY WITH MEALS 180 tablet 2  . norethindrone (HEATHER) 0.35 MG tablet Take 1 tablet by mouth daily.    . rosuvastatin (CRESTOR) 10 MG tablet Take 1 tablet by mouth  daily 90 tablet 1  . sertraline (ZOLOFT) 100 MG tablet Take 150 mg by mouth daily.     . traZODone (DESYREL) 150  MG tablet Take 150 mg by mouth at bedtime.       No current facility-administered medications for this visit.      REVIEW OF SYSTEMS:   Constitutional: Denies fevers, chills or night sweats Eyes: Denies blurriness of vision Ears, nose, mouth, throat, and face: Denies mucositis or sore throat Respiratory: Denies cough, dyspnea or wheezes Cardiovascular: Denies palpitation, chest discomfort or lower extremity swelling Gastrointestinal:  Denies nausea, heartburn or change in bowel habits Skin: Denies abnormal skin rashes Lymphatics: Denies new lymphadenopathy or easy bruising Neurological:Denies numbness, tingling or new weaknesses Behavioral/Psych: Mood is stable, no new changes  All other systems were reviewed with the patient and are negative.  PHYSICAL EXAMINATION: ECOG PERFORMANCE STATUS: 0 - Asymptomatic  Vitals:   08/30/16 1510  BP: 123/69  Pulse: 88  Resp: 18  Temp: 98.2 F (36.8 C)   Filed Weights   08/30/16 1510  Weight: 263 lb 6.4 oz (119.5 kg)    GENERAL:alert, no distress and comfortable SKIN: skin color, texture, turgor are normal, no rashes or significant lesions Musculoskeletal:no cyanosis of digits and no clubbing  NEURO: alert & oriented x 3 with fluent speech, no focal motor/sensory deficits  LABORATORY DATA:  I have reviewed the data as listed     Component Value Date/Time   NA 137 03/29/2016 0853   K 4.2 03/29/2016 0853   CL 102 03/29/2016 0853   CO2 26 03/29/2016 0853   GLUCOSE 108 (H) 03/29/2016 0853   BUN 11 03/29/2016 0853   CREATININE 0.67 03/29/2016 0853  CALCIUM 9.2 03/29/2016 0853   PROT 7.8 10/01/2013 1605   ALBUMIN 3.8 10/01/2013 1605   AST 27 10/01/2013 1605   ALT 20 10/01/2013 1605   ALKPHOS 61 10/01/2013 1605   BILITOT 0.3 10/01/2013 1605   GFRNONAA >60 09/15/2015 1210   GFRAA >60 09/15/2015 1210    No results found for: SPEP, UPEP  Lab Results  Component Value Date   WBC 13.1 (H) 08/30/2016   NEUTROABS 8.1 (H)  08/30/2016   HGB 12.2 08/30/2016   HCT 36.3 08/30/2016   MCV 81.6 08/30/2016   PLT 355 08/30/2016      Chemistry      Component Value Date/Time   NA 137 03/29/2016 0853   K 4.2 03/29/2016 0853   CL 102 03/29/2016 0853   CO2 26 03/29/2016 0853   BUN 11 03/29/2016 0853   CREATININE 0.67 03/29/2016 0853      Component Value Date/Time   CALCIUM 9.2 03/29/2016 0853   ALKPHOS 61 10/01/2013 1605   AST 27 10/01/2013 1605   ALT 20 10/01/2013 1605   BILITOT 0.3 10/01/2013 1605      ASSESSMENT & PLAN:  Leukocytosis She has chronic leukocytosis for 9 years. The pattern is suggestive of reactive leukocytosis with neutrophilia, possibly from stress and obesity. I recommend observation only. She does not need long term follow-up unless his WBC is consistently greater than 20,000, 3 months apart  Reactive thrombocytosis She has mild thrombocytosis for 3 years. I suspect it is reactive in nature. I recommend aspirin therapy. We also discussed rare possibility of myeloproliferative disorder and she agrees to have her blood drawn  I will call her with test results   No orders of the defined types were placed in this encounter.   All questions were answered. The patient knows to call the clinic with any problems, questions or concerns. No barriers to learning was detected.  I spent 10 minutes counseling the patient face to face. The total time spent in the appointment was 15 minutes and more than 50% was on counseling.     Heath Lark, MD 11/6/20174:21 PM

## 2016-08-30 NOTE — Assessment & Plan Note (Signed)
She has chronic leukocytosis for 9 years. The pattern is suggestive of reactive leukocytosis with neutrophilia, possibly from stress and obesity. I recommend observation only. She does not need long term follow-up unless his WBC is consistently greater than 20,000, 3 months apart

## 2016-08-30 NOTE — Assessment & Plan Note (Signed)
She has mild thrombocytosis for 3 years. I suspect it is reactive in nature. I recommend aspirin therapy. We also discussed rare possibility of myeloproliferative disorder and she agrees to have her blood drawn  I will call her with test results

## 2016-08-31 LAB — FERRITIN: Ferritin: 24 ng/ml (ref 9–269)

## 2016-08-31 LAB — SEDIMENTATION RATE: Sedimentation Rate-Westergren: 30 mm/hr (ref 0–32)

## 2016-09-10 ENCOUNTER — Telehealth: Payer: Self-pay | Admitting: Hematology and Oncology

## 2016-09-10 NOTE — Telephone Encounter (Signed)
I attempted to call the patient twice to review recent blood test results. Her platelet count was within normal limits. Additional workup with next generation sequencing study for myeloproliferative disorder were all within normal limits. I suspect the cause of her chronic leukocytosis & thrombocytosis is reactive in nature. She does not need return appointment for further follow-up

## 2016-10-26 ENCOUNTER — Telehealth: Payer: Self-pay | Admitting: Family Medicine

## 2016-10-26 NOTE — Telephone Encounter (Signed)
Pt would like to transfer to Dr. Martinique from Dr. Maudie Mercury for insurance purpose is it okay?

## 2016-10-27 NOTE — Telephone Encounter (Signed)
Ok with me to transfer if she wishes - though we have identical insurance coverage. If she has insurance questions can you put her in touch with the billing office? Dr. Martinique has the same insurance coverage and I do. Thanks.

## 2016-11-02 ENCOUNTER — Ambulatory Visit: Payer: 59 | Admitting: Family Medicine

## 2016-11-08 ENCOUNTER — Other Ambulatory Visit: Payer: Self-pay | Admitting: Family Medicine

## 2016-12-25 ENCOUNTER — Other Ambulatory Visit: Payer: Self-pay | Admitting: Family Medicine

## 2017-02-09 ENCOUNTER — Other Ambulatory Visit: Payer: Self-pay | Admitting: Family Medicine

## 2017-02-16 NOTE — Telephone Encounter (Signed)
Pt has been scheduled.  °

## 2017-02-21 ENCOUNTER — Ambulatory Visit (INDEPENDENT_AMBULATORY_CARE_PROVIDER_SITE_OTHER): Payer: 59 | Admitting: Family Medicine

## 2017-02-21 ENCOUNTER — Encounter: Payer: Self-pay | Admitting: Family Medicine

## 2017-02-21 VITALS — BP 118/78 | HR 95 | Resp 12 | Ht 61.0 in | Wt 273.4 lb

## 2017-02-21 DIAGNOSIS — N611 Abscess of the breast and nipple: Secondary | ICD-10-CM | POA: Diagnosis not present

## 2017-02-21 DIAGNOSIS — Z6841 Body Mass Index (BMI) 40.0 and over, adult: Secondary | ICD-10-CM

## 2017-02-21 DIAGNOSIS — I10 Essential (primary) hypertension: Secondary | ICD-10-CM | POA: Diagnosis not present

## 2017-02-21 DIAGNOSIS — E782 Mixed hyperlipidemia: Secondary | ICD-10-CM | POA: Diagnosis not present

## 2017-02-21 DIAGNOSIS — E119 Type 2 diabetes mellitus without complications: Secondary | ICD-10-CM | POA: Diagnosis not present

## 2017-02-21 DIAGNOSIS — E669 Obesity, unspecified: Secondary | ICD-10-CM | POA: Diagnosis not present

## 2017-02-21 DIAGNOSIS — IMO0001 Reserved for inherently not codable concepts without codable children: Secondary | ICD-10-CM

## 2017-02-21 LAB — COMPREHENSIVE METABOLIC PANEL
ALT: 16 U/L (ref 0–35)
AST: 16 U/L (ref 0–37)
Albumin: 4.3 g/dL (ref 3.5–5.2)
Alkaline Phosphatase: 81 U/L (ref 39–117)
BUN: 7 mg/dL (ref 6–23)
CHLORIDE: 100 meq/L (ref 96–112)
CO2: 28 meq/L (ref 19–32)
Calcium: 9.4 mg/dL (ref 8.4–10.5)
Creatinine, Ser: 0.66 mg/dL (ref 0.40–1.20)
GFR: 104.98 mL/min (ref >60.00)
Glucose, Bld: 132 mg/dL — ABNORMAL HIGH (ref 70–99)
POTASSIUM: 3.7 meq/L (ref 3.5–5.1)
SODIUM: 137 meq/L (ref 135–145)
Total Bilirubin: 0.4 mg/dL (ref 0.2–1.2)
Total Protein: 6.9 g/dL (ref 6.0–8.3)

## 2017-02-21 LAB — MICROALBUMIN / CREATININE URINE RATIO
CREATININE, U: 352.9 mg/dL
MICROALB UR: 18.6 mg/dL — AB (ref 0.0–1.9)
MICROALB/CREAT RATIO: 5.3 mg/g (ref 0.0–30.0)

## 2017-02-21 LAB — LIPID PANEL
CHOL/HDL RATIO: 3
CHOLESTEROL: 130 mg/dL (ref 0–200)
HDL: 42.2 mg/dL (ref >39.00)
NonHDL: 88.17
Triglycerides: 257 mg/dL — ABNORMAL HIGH (ref 0.0–149.0)
VLDL: 51.4 mg/dL — AB (ref 0.0–40.0)

## 2017-02-21 LAB — HEMOGLOBIN A1C: Hgb A1c MFr Bld: 7.5 % — ABNORMAL HIGH (ref 4.6–6.5)

## 2017-02-21 LAB — LDL CHOLESTEROL, DIRECT: Direct LDL: 50 mg/dL

## 2017-02-21 MED ORDER — SULFAMETHOXAZOLE-TRIMETHOPRIM 800-160 MG PO TABS: 1.0000 | ORAL_TABLET | Freq: Two times a day (BID) | ORAL | 0 refills | Status: AC

## 2017-02-21 NOTE — Progress Notes (Signed)
HPI:   [name] is a 41 y.o. female, who is here today to establish care with me.  Former PCP: Dr Maudie Mercury Last preventive routine visit: 03/2016  Chronic medical problems: HTN,DM II,HLD,PCOS, anxiety,and depression among some.  She follows with gyn annually and psychiatrists   Hypertension:   Dx around 2016  Currently on Cozaar 100 mg daily and Amlodipine 5 mg.   She follows low salt diet, not consistently. She is taking medications as instructed, no side effects reported.  She has not noted unusual headache, visual changes, exertional chest pain, dyspnea,  focal weakness, or edema.   Lab Results  Component Value Date   CREATININE 0.67 03/29/2016   BUN 11 03/29/2016   NA 137 03/29/2016   K 4.2 03/29/2016   CL 102 03/29/2016   CO2 26 03/29/2016    Diabetes Mellitus II:  Dx about 2-3 years ago.  She is on Metformin 500 mg bid.  Checking BS's : Does not check it.  Last eye exam Summer 2017.  She is tolerating medications well. She  denies abdominal pain, nausea, vomiting, polydipsia, polyuria, or polyphagia. No numbness, tingling, or burning.  Lab Results  Component Value Date   HGBA1C 6.3 03/29/2016   HLD:  Currently she is on Crestor 10 mg daily. Last FLP 03/2016: TC 125,TG 338, HDL 32,LDL 44. She is not exercising regularly and has not followed a healthy diet consistently.   Concerns today: "Lump" in right breast.  About 10 days ago she noted erythema and tenderness on right breast. Virtual visit a few days ago, prescribed Cephalexin,which course she completed.Abx "did not help at all", today lesion burt and drained yellowish material, she noted improvement in pain level. Pain is intermittent now but was constant. 2/10,"irritating pain" it was severe. Exacerbated by palpation and breast movement, alleviated by draining. Now pruritic. She denies fever,chills, or myalgias.  She had mammogram last year and has an appt with her gyn  03/2017. [facility] pos for breast cancer, maternal and paternal aunt.    Review of Systems  Constitutional: Positive for fatigue. Negative for activity change, appetite change, fever and unexpected weight change.  HENT: Negative for mouth sores, nosebleeds and trouble swallowing.   Eyes: Negative for redness and visual disturbance.  Respiratory: Negative for cough, shortness of breath and wheezing.   Cardiovascular: Negative for chest pain, palpitations and leg swelling.  Gastrointestinal: Negative for abdominal pain, nausea and vomiting.       Negative for changes in bowel habits.  Endocrine: Negative for polydipsia, polyphagia and polyuria.  Genitourinary: Negative for decreased urine volume and hematuria.  Musculoskeletal: Negative for gait problem and myalgias.  Skin: Positive for rash and wound.  Neurological: Negative for syncope, weakness, numbness and headaches.  Psychiatric/Behavioral: Negative for confusion.      Current Outpatient Prescriptions on File Prior to Visit  Medication Sig Dispense Refill  . amLODipine (NORVASC) 5 MG tablet TAKE 1 TABLET BY MOUTH  DAILY 90 tablet 0  . losartan (COZAAR) 100 MG tablet TAKE 1 TABLET BY MOUTH  DAILY 90 tablet 1  . metFORMIN (GLUCOPHAGE) 500 MG tablet TAKE 1 TABLET BY MOUTH TWO  TIMES DAILY WITH MEALS 180 tablet 2  . norethindrone (HEATHER) 0.35 MG tablet Take 1 tablet by mouth daily.    . rosuvastatin (CRESTOR) 10 MG tablet TAKE 1 TABLET BY MOUTH  DAILY 90 tablet 0  . sertraline (ZOLOFT) 100 MG tablet Take 150 mg by mouth daily.     Marland Kitchen  traZODone (DESYREL) 150 MG tablet Take 150 mg by mouth at bedtime.       No current facility-administered medications on file prior to visit.      Past Medical History:  Diagnosis Date  . Anxiety   . Carpal tunnel syndrome of left wrist 11/2015  . Chronic low back pain   . Cough 12/08/2015  . Depression   . History of kidney stones   . Hyperlipemia 12/02/2015  . Hypertension    states under control  with med., has been on med. < 9 mos.  . Migraines   . Non-insulin dependent type 2 diabetes mellitus ([place])   . Polycystic ovarian syndrome   . Tension headache, chronic    No Known Allergies  Family History  Problem Relation Age of Onset  . Arthritis Mother   . Breast cancer Paternal Aunt   . Mental illness Father   . Melanoma Father     Social History   Social History  . Marital status: Married    Spouse name: N/A  . Number of children: 1  . Years of education: N/A   Occupational History  . 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 Smoking status: Former Smoker    Packs/day: 0.00    Quit date: 09/07/1998  . Smokeless tobacco: Never Used  . Alcohol use Yes     Comment: occasionally  . Drug use: No  . Sexual activity: Yes    Birth control/ protection: Pill     Comment: microgestin   Other Topics Concern  . None   Social History Narrative   Works in Therapist, art   Married    Vitals:   02/21/17 1018  BP: 118/78  Pulse: 95  Resp: 12    Body mass index is 51.65 kg/m.   Physical Exam  Nursing note and vitals reviewed. Constitutional: She is oriented to person, place, and time. She appears well-developed. No distress.  HENT:  Head: Atraumatic.  Mouth/Throat: Oropharynx is clear and moist and mucous membranes are normal.  Eyes: Conjunctivae and EOM are normal. Pupils are equal, round, and reactive to light.  Cardiovascular: Normal rate and regular rhythm.   No murmur heard. Pulses:      Dorsalis pedis pulses are 2+ on the right side, and 2+ on the left side.  Respiratory: Effort normal and breath sounds normal. No respiratory distress.  GI: Soft. She exhibits no mass. There is no hepatomegaly. There is no tenderness.  Musculoskeletal: She exhibits no edema.  Lymphadenopathy:    She has no cervical adenopathy.    She has no axillary adenopathy.  Neurological: She is alert and oriented to person, place, and time. She has normal  strength. Coordination normal.  Skin: Skin is warm. There is erythema.  Right breast 3-4 cm erythematous area,with areas of induration,no fluctuant area. 2 cm supe excoriation, no active draining. No local heat.  Psychiatric: She has a normal mood and affect.  Well groomed, good eye contact.   Diabetic foot exam:  Monofilament normal bilateral. Peripheral pulses present (DP). No calluses No hypertrophic/long toenails.  ASSESSMENT AND PLAN:   [name] was seen today for lump in breast.  Diagnoses and all orders for this visit:  Lab Results  Component Value Date   HGBA1C 7.5 (H) 02/21/2017     Chemistry      Component Value Date/Time   NA 137 02/21/2017 1117   K 3.7 02/21/2017 1117   CL 100 02/21/2017 1117  CO2 28 02/21/2017 1117   BUN 7 02/21/2017 1117   CREATININE 0.66 02/21/2017 1117      Component Value Date/Time   CALCIUM 9.4 02/21/2017 1117   ALKPHOS 81 02/21/2017 1117   AST 16 02/21/2017 1117   ALT 16 02/21/2017 1117   BILITOT 0.4 02/21/2017 1117     Lab Results  Component Value Date   CHOL 130 02/21/2017   HDL 42.20 02/21/2017   LDLDIRECT 50.0 02/21/2017   TRIG 257.0 (H) 02/21/2017   CHOLHDL 3 02/21/2017    Abscess of right breast  Continue warm compressed. Instructed about warning signs. She prefers to wait until she sees her gyn for mammogram. Bactrim DS bid for 7 days.  -     sulfamethoxazole-trimethoprim (BACTRIM DS,SEPTRA DS) 800-160 MG tablet; Take 1 tablet by mouth 2 (two) times daily.  Type 2 diabetes mellitus without complication, without long-term current use of insulin (HCC)  HgA1C pending. No changes in current management, will adjust med if needed. Regular exercise and healthy diet with avoidance of added sugar food intake is an important part of treatment and recommended. Annual eye exam, periodic dental and foot care recommended. F/U in 5-6 months  -     Comprehensive metabolic panel -     Hemoglobin A1c -     Microalbumin /  creatinine urine ratio  Essential hypertension  Adequately controlled. No changes in current management. DASH-low salt diet recommended. Eye exam recommended annually. F/U in 6 months, before if needed.  -     Comprehensive metabolic panel  Mixed hyperlipidemia  No changes in current management, will follow labs done today and will give further recommendations accordingly. F/U in 4-6 months.  -     Comprehensive metabolic panel -     Lipid panel -     LDL cholesterol, direct  Class 3 obesity with serious comorbidity and body mass index (BMI) of 50.0 to 59.9 in adult, unspecified obesity type (HCC)  We discussed benefits of wt loss as well as adverse effects of obesity. Consistency with healthy diet and physical activity recommended. Daily brisk walking for 15-30 min as tolerated.        Issis Lindseth G. Martinique, MD  [facility]. [place] office.

## 2017-02-21 NOTE — Progress Notes (Signed)
Pre visit review using our clinic review tool, if applicable. No additional management support is needed unless otherwise documented below in the visit note. 

## 2017-02-21 NOTE — Patient Instructions (Signed)
A few things to remember from today's visit:   Essential hypertension - Plan: Comprehensive metabolic panel  Type 2 diabetes mellitus without complication, without long-term current use of insulin (HCC) - Plan: Comprehensive metabolic panel, Hemoglobin A1c, Microalbumin / creatinine urine ratio  Mixed hyperlipidemia - Plan: Comprehensive metabolic panel, Lipid panel  Left breast abscess - Plan: sulfamethoxazole-trimethoprim (BACTRIM DS,SEPTRA DS) 800-160 MG tablet  Warm compresses/  Blood pressure goal for most people is less than 140/90. Some populations (older than 60) the goal is less than 150/90.  Most recent cardiologists' recommendations recommend blood pressure at or less than 130/80.   Elevated blood pressure increases the risk of strokes, heart and kidney disease, and eye problems. Regular physical activity and a healthy diet (DASH diet) usually help. Low salt diet. Take medications as instructed.   DASH diet recommended: high in vegetables, fruits, low-fat dairy products, whole grains, poultry, fish, and nuts; and low in sweets, sugar-sweetened beverages, and red meats.   Caution with some over the counter medications as cold medications, dietary products (for weight loss), and Ibuprofen or Aleve (frequent use);all these medications could cause elevation of blood pressure.   Please be sure medication list is accurate. If a new problem present, please set up appointment sooner than planned today.

## 2017-02-22 ENCOUNTER — Ambulatory Visit: Payer: 59 | Admitting: Family Medicine

## 2017-02-22 MED ORDER — METFORMIN HCL 1000 MG PO TABS: 1000.0000 mg | ORAL_TABLET | Freq: Two times a day (BID) | ORAL | 1 refills | Status: DC

## 2017-02-28 ENCOUNTER — Encounter: Payer: Self-pay | Admitting: Family Medicine

## 2017-03-05 ENCOUNTER — Encounter (HOSPITAL_COMMUNITY): Payer: Self-pay | Admitting: Emergency Medicine

## 2017-03-05 ENCOUNTER — Observation Stay (HOSPITAL_COMMUNITY)
Admission: EM | Admit: 2017-03-05 | Discharge: 2017-03-08 | Disposition: A | Discharge status: Home / Self Care | Payer: 59 | Attending: Internal Medicine | Admitting: Internal Medicine

## 2017-03-05 ENCOUNTER — Emergency Department (HOSPITAL_COMMUNITY): Payer: 59

## 2017-03-05 DIAGNOSIS — I10 Essential (primary) hypertension: Secondary | ICD-10-CM | POA: Diagnosis not present

## 2017-03-05 DIAGNOSIS — Z6841 Body Mass Index (BMI) 40.0 and over, adult: Secondary | ICD-10-CM

## 2017-03-05 DIAGNOSIS — E282 Polycystic ovarian syndrome: Secondary | ICD-10-CM | POA: Diagnosis not present

## 2017-03-05 DIAGNOSIS — E876 Hypokalemia: Secondary | ICD-10-CM | POA: Diagnosis not present

## 2017-03-05 DIAGNOSIS — N10 Acute tubulo-interstitial nephritis: Principal | ICD-10-CM

## 2017-03-05 DIAGNOSIS — Z7984 Long term (current) use of oral hypoglycemic drugs: Secondary | ICD-10-CM | POA: Diagnosis not present

## 2017-03-05 DIAGNOSIS — E669 Obesity, unspecified: Secondary | ICD-10-CM | POA: Diagnosis not present

## 2017-03-05 DIAGNOSIS — N12 Tubulo-interstitial nephritis, not specified as acute or chronic: Secondary | ICD-10-CM

## 2017-03-05 DIAGNOSIS — E119 Type 2 diabetes mellitus without complications: Secondary | ICD-10-CM | POA: Diagnosis not present

## 2017-03-05 DIAGNOSIS — F3342 Major depressive disorder, recurrent, in full remission: Secondary | ICD-10-CM | POA: Diagnosis not present

## 2017-03-05 DIAGNOSIS — N200 Calculus of kidney: Secondary | ICD-10-CM | POA: Diagnosis not present

## 2017-03-05 DIAGNOSIS — B9620 Unspecified Escherichia coli [E. coli] as the cause of diseases classified elsewhere: Secondary | ICD-10-CM | POA: Insufficient documentation

## 2017-03-05 DIAGNOSIS — Z87442 Personal history of urinary calculi: Secondary | ICD-10-CM | POA: Diagnosis not present

## 2017-03-05 HISTORY — DX: Disorder of kidney and ureter, unspecified: N28.9

## 2017-03-05 LAB — CBC WITH DIFFERENTIAL/PLATELET
BASOS ABS: 0 10*3/uL (ref 0.0–0.1)
BASOS PCT: 0 %
EOS ABS: 0.1 10*3/uL (ref 0.0–0.7)
Eosinophils Relative: 1 %
HEMATOCRIT: 36.6 % (ref 36.0–46.0)
HEMOGLOBIN: 12.5 g/dL (ref 12.0–15.0)
Lymphocytes Relative: 17 %
Lymphs Abs: 2.7 10*3/uL (ref 0.7–4.0)
MCH: 27.7 pg (ref 26.0–34.0)
MCHC: 34.2 g/dL (ref 30.0–36.0)
MCV: 81.2 fL (ref 78.0–100.0)
MONOS PCT: 7 %
Monocytes Absolute: 1.2 10*3/uL — ABNORMAL HIGH (ref 0.1–1.0)
NEUTROS ABS: 11.8 10*3/uL — AB (ref 1.7–7.7)
NEUTROS PCT: 75 %
Platelets: 392 10*3/uL (ref 150–400)
RBC: 4.51 MIL/uL (ref 3.87–5.11)
RDW: 14.5 % (ref 11.5–15.5)
WBC: 15.8 10*3/uL — ABNORMAL HIGH (ref 4.0–10.5)

## 2017-03-05 LAB — URINALYSIS, ROUTINE W REFLEX MICROSCOPIC
BILIRUBIN URINE: NEGATIVE
GLUCOSE, UA: NEGATIVE mg/dL
KETONES UR: NEGATIVE mg/dL
Nitrite: POSITIVE — AB
PH: 5 (ref 5.0–8.0)
Protein, ur: 100 mg/dL — AB
Specific Gravity, Urine: 1.015 (ref 1.005–1.030)

## 2017-03-05 LAB — COMPREHENSIVE METABOLIC PANEL
ALK PHOS: 62 U/L (ref 38–126)
ALT: 19 U/L (ref 14–54)
ANION GAP: 12 (ref 5–15)
AST: 25 U/L (ref 15–41)
Albumin: 3.9 g/dL (ref 3.5–5.0)
BILIRUBIN TOTAL: 0.3 mg/dL (ref 0.3–1.2)
BUN: 8 mg/dL (ref 6–20)
CALCIUM: 9.4 mg/dL (ref 8.9–10.3)
CO2: 22 mmol/L (ref 22–32)
CREATININE: 0.69 mg/dL (ref 0.44–1.00)
Chloride: 101 mmol/L (ref 101–111)
Glucose, Bld: 136 mg/dL — ABNORMAL HIGH (ref 65–99)
Potassium: 3.1 mmol/L — ABNORMAL LOW (ref 3.5–5.1)
Sodium: 135 mmol/L (ref 135–145)
TOTAL PROTEIN: 7.5 g/dL (ref 6.5–8.1)

## 2017-03-05 LAB — PREGNANCY, URINE: Preg Test, Ur: NEGATIVE

## 2017-03-05 LAB — LACTIC ACID, PLASMA: Lactic Acid, Venous: 1.8 mmol/L (ref 0.5–1.9)

## 2017-03-05 LAB — MAGNESIUM: Magnesium: 1.7 mg/dL (ref 1.7–2.4)

## 2017-03-05 LAB — GLUCOSE, CAPILLARY: GLUCOSE-CAPILLARY: 132 mg/dL — AB (ref 65–99)

## 2017-03-05 MED ORDER — SODIUM CHLORIDE 0.9 % IV BOLUS (SEPSIS)
1000.0000 mL | Freq: Once | INTRAVENOUS | Status: AC
  Administered 2017-03-05: 1000 mL via INTRAVENOUS

## 2017-03-05 MED ORDER — MORPHINE SULFATE (PF) 4 MG/ML IV SOLN: 2.0000 mg | INTRAVENOUS | Status: DC | PRN

## 2017-03-05 MED ORDER — SODIUM CHLORIDE 0.9 % IV SOLN
Freq: Once | INTRAVENOUS | Status: AC
  Administered 2017-03-05: 20:00:00 via INTRAVENOUS

## 2017-03-05 MED ORDER — ROSUVASTATIN CALCIUM 10 MG PO TABS
10.0000 mg | ORAL_TABLET | Freq: Every day | ORAL | Status: DC
  Administered 2017-03-05 – 2017-03-08 (×4): 10 mg via ORAL
  Filled 2017-03-05 (×4): qty 1

## 2017-03-05 MED ORDER — POTASSIUM CHLORIDE IN NACL 20-0.9 MEQ/L-% IV SOLN
INTRAVENOUS | Status: DC
  Administered 2017-03-05 – 2017-03-06 (×2): via INTRAVENOUS
  Filled 2017-03-05 (×2): qty 1000

## 2017-03-05 MED ORDER — ONDANSETRON HCL 4 MG PO TABS: 4.0000 mg | ORAL_TABLET | Freq: Four times a day (QID) | ORAL | Status: DC | PRN

## 2017-03-05 MED ORDER — AMLODIPINE BESYLATE 5 MG PO TABS
5.0000 mg | ORAL_TABLET | Freq: Every day | ORAL | Status: DC
  Administered 2017-03-06 – 2017-03-08 (×3): 5 mg via ORAL
  Filled 2017-03-05 (×3): qty 1

## 2017-03-05 MED ORDER — POTASSIUM CHLORIDE CRYS ER 20 MEQ PO TBCR
40.0000 meq | EXTENDED_RELEASE_TABLET | Freq: Once | ORAL | Status: AC
  Administered 2017-03-05: 40 meq via ORAL
  Filled 2017-03-05: qty 2

## 2017-03-05 MED ORDER — MORPHINE SULFATE (PF) 4 MG/ML IV SOLN
4.0000 mg | Freq: Once | INTRAVENOUS | Status: AC
  Administered 2017-03-05: 4 mg via INTRAVENOUS
  Filled 2017-03-05: qty 1

## 2017-03-05 MED ORDER — OXYCODONE-ACETAMINOPHEN 5-325 MG PO TABS
2.0000 | ORAL_TABLET | Freq: Once | ORAL | Status: AC
  Administered 2017-03-05: 2 via ORAL
  Filled 2017-03-05: qty 2

## 2017-03-05 MED ORDER — ACETAMINOPHEN 325 MG PO TABS
650.0000 mg | ORAL_TABLET | Freq: Once | ORAL | Status: AC
  Administered 2017-03-05: 650 mg via ORAL
  Filled 2017-03-05: qty 2

## 2017-03-05 MED ORDER — INSULIN ASPART 100 UNIT/ML ~~LOC~~ SOLN
0.0000 [IU] | Freq: Every day | SUBCUTANEOUS | Status: DC
Start: 2017-03-05 — End: 2017-03-08

## 2017-03-05 MED ORDER — LEVOFLOXACIN 750 MG PO TABS
750.0000 mg | ORAL_TABLET | Freq: Once | ORAL | Status: AC
  Administered 2017-03-05: 750 mg via ORAL
  Filled 2017-03-05: qty 1

## 2017-03-05 MED ORDER — OXYCODONE HCL 5 MG PO TABS
5.0000 mg | ORAL_TABLET | ORAL | Status: DC | PRN
  Administered 2017-03-06 – 2017-03-07 (×4): 5 mg via ORAL
  Filled 2017-03-05 (×4): qty 1

## 2017-03-05 MED ORDER — TRAZODONE HCL 100 MG PO TABS
150.0000 mg | ORAL_TABLET | Freq: Every day | ORAL | Status: DC
  Administered 2017-03-05 – 2017-03-07 (×3): 150 mg via ORAL
  Filled 2017-03-05 (×3): qty 1

## 2017-03-05 MED ORDER — INSULIN ASPART 100 UNIT/ML ~~LOC~~ SOLN
0.0000 [IU] | Freq: Three times a day (TID) | SUBCUTANEOUS | Status: DC
  Administered 2017-03-06 – 2017-03-07 (×2): 2 [IU] via SUBCUTANEOUS

## 2017-03-05 MED ORDER — ONDANSETRON HCL 4 MG/2ML IJ SOLN: 4.0000 mg | Freq: Four times a day (QID) | INTRAMUSCULAR | Status: DC | PRN

## 2017-03-05 MED ORDER — SERTRALINE HCL 100 MG PO TABS
100.0000 mg | ORAL_TABLET | Freq: Every day | ORAL | Status: DC
  Administered 2017-03-06 – 2017-03-08 (×3): 100 mg via ORAL
  Filled 2017-03-05 (×3): qty 1

## 2017-03-05 MED ORDER — ENOXAPARIN SODIUM 60 MG/0.6ML ~~LOC~~ SOLN
60.0000 mg | Freq: Every day | SUBCUTANEOUS | Status: DC
  Administered 2017-03-05 – 2017-03-07 (×3): 60 mg via SUBCUTANEOUS
  Filled 2017-03-05 (×3): qty 0.6

## 2017-03-05 MED ORDER — KETOROLAC TROMETHAMINE 15 MG/ML IJ SOLN
15.0000 mg | Freq: Four times a day (QID) | INTRAMUSCULAR | Status: DC | PRN
  Administered 2017-03-05 – 2017-03-07 (×4): 15 mg via INTRAVENOUS
  Filled 2017-03-05 (×4): qty 1

## 2017-03-05 MED ORDER — ONDANSETRON HCL 4 MG/2ML IJ SOLN
4.0000 mg | Freq: Once | INTRAMUSCULAR | Status: AC
  Administered 2017-03-05: 4 mg via INTRAVENOUS
  Filled 2017-03-05: qty 2

## 2017-03-05 MED ORDER — CEFTRIAXONE SODIUM 2 G IJ SOLR
2.0000 g | Freq: Once | INTRAMUSCULAR | Status: AC
  Administered 2017-03-05: 2 g via INTRAVENOUS
  Filled 2017-03-05: qty 2

## 2017-03-05 NOTE — ED Notes (Signed)
Bed: GO11 Expected date:  Expected time:  Means of arrival:  Comments: Patient still in room

## 2017-03-05 NOTE — ED Triage Notes (Signed)
Pt complaint of left flank pain with associated urinary frequency since Wednesday; hx of kidney stones.

## 2017-03-05 NOTE — H&P (Signed)
History and Physical  Patient Name: [name]     ZSW:[id number]    DOB: 07-03-1976    DOA: 03/05/2017 PCP: Martinique, Betty G, MD   Patient coming from: Home  Chief Complaint: Malaise, flank pain, dysuria  HPI: [name] is a 41 y.o. female with a past medical history significant for PCOS/NIDDM, HTN, obesity, hx of nephrolithiasis who presents with 4 days flank pain and dysuria.  The patient was in her usual state of health until Monday when she started to develop urinary urgency, having to "go all the time", dysuria at the end of urination, and left-sided flank pain. For the last 3 weeks, she had had a breast abscess for which she was initially treated with 7 days of Keflex which she completed around 12 days ago followed by 7 days of Bactrim which she completed on Monday.  Over this week, the dysuria persisted, her pain was moderate but progressed to intense, came in "waves" and finally today was severe, more frequent or constant, and associated with nausea and chills so she came to the ER.  ED course: -Temp 100.20F, heart rate 122, respirations 20, pulse is normal, blood pressure 141/90 -Na 135, K 3.1, Cr 0.7, WBC 15.8K, Hgb 12.5 -Urinalysis showed full field WBCs, bacteria, nitrites -Pregnancy test negative -CT renal protocol was obtained that showed two 2 mm nonobstructing stones in the left lower pole, some stranding around the left ureter, and no hydronephrosis anywhere -She was given oral Levaquin, 2 L IV fluids, and potassium -Even after fluids, she was still tachycardic to 110, and was unable to take PO so [facility] was asked to admit for pyelonephritis       ROS: Review of Systems  Constitutional: Positive for chills and malaise/fatigue. Negative for fever.  Respiratory: Negative for cough, hemoptysis, sputum production and shortness of breath.   Gastrointestinal: Positive for nausea and vomiting.  Genitourinary: Positive for dysuria, flank pain, frequency and urgency.  Negative for hematuria.  All other systems reviewed and are negative.         Past Medical History:  Diagnosis Date  . Anxiety   . Carpal tunnel syndrome of left wrist 11/2015  . Chronic low back pain   . Cough 12/08/2015  . Depression   . History of kidney stones   . Hyperlipemia 12/02/2015  . Hypertension    states under control with med., has been on med. < 9 mos.  . Migraines   . Non-insulin dependent type 2 diabetes mellitus ([place])   . Polycystic ovarian syndrome   . Renal disorder   . Tension headache, chronic     Past Surgical History:  Procedure Laterality Date  . CARPAL TUNNEL RELEASE Right 09/16/2015   Procedure: RIGHT CARPAL TUNNEL RELEASE;  Surgeon: Leanora Cover, MD;  Location: [place];  Service: Orthopedics;  Laterality: Right;  . CARPAL TUNNEL RELEASE Left 12/12/2015   Procedure: LEFT CARPAL TUNNEL RELEASE;  Surgeon: Leanora Cover, MD;  Location: [place];  Service: Orthopedics;  Laterality: Left;  . CESAREAN SECTION  05/06/2007  . EXTRACORPOREAL SHOCK WAVE LITHOTRIPSY  11/22/2011  . LEEP N/A 03/17/2015   Procedure: LOOP ELECTROSURGICAL EXCISION PROCEDURE (LEEP) and Cold Knife Conization;  Surgeon: Eldred Manges, MD;  Location: [facility];  Service: Gynecology;  Laterality: N/A;  . WISDOM TOOTH EXTRACTION      Social History: Patient lives with her husband and daughter.  The patient walks unassisted. SHe is from [place], went to [place].  She works for [employer].  She does not smoke.    No Known Allergies  Family history: family history includes Arthritis in her mother; Breast cancer in her paternal aunt; Melanoma in her father; Mental illness in her father.  Prior to Admission medications   Medication Sig Start Date End Date Taking? Authorizing Provider  amLODipine (NORVASC) 5 MG tablet TAKE 1 TABLET BY MOUTH  DAILY 02/10/17  Yes Colin Benton R, DO  ibuprofen (ADVIL,MOTRIN) 200 MG tablet Take 400 mg by mouth every 6 (six)  hours as needed (pain).   Yes [provider]  losartan (COZAAR) 100 MG tablet TAKE 1 TABLET BY MOUTH  DAILY 11/09/16  Yes Colin Benton R, DO  Menthol (ICY HOT) 5 % PTCH Apply 1 patch topically as needed (pain).   Yes [provider]  metFORMIN (GLUCOPHAGE) 1000 MG tablet Take 1 tablet (1,000 mg total) by mouth 2 (two) times daily with a meal. 02/22/17  Yes Martinique, Betty G, MD  norethindrone (HEATHER) 0.35 MG tablet Take 1 tablet by mouth daily.   Yes [provider]  rosuvastatin (CRESTOR) 10 MG tablet TAKE 1 TABLET BY MOUTH  DAILY 12/27/16  Yes Colin Benton R, DO  sertraline (ZOLOFT) 100 MG tablet Take 100 mg by mouth daily.    Yes [provider]  traZODone (DESYREL) 150 MG tablet Take 150 mg by mouth at bedtime.     Yes [provider]       Physical Exam: BP (!) 165/86 (BP Location: Left Arm)   Pulse (!) 110   Temp 99 F (37.2 C) (Oral)   Resp 20   SpO2 99%  General appearance: Well-developed, obese adult female, alert and in mild distress from malaise and blood draw.   Eyes: Anicteric, conjunctiva pink, lids and lashes normal. PERRL.    ENT: No nasal deformity, discharge, epistaxis.  Hearing normal. OP moist without lesions.   Lymph: Not palpable given habitus. Skin: Hot and flushed, mildly diaphoretic.  No jaundice.  No suspicious rashes or lesions.  The right breast abscess is minimally tender, mostly resolved per patient. Cardiac: Tachy regular, nl S1-S2, no murmurs appreciated.  Capillary refill is brisk.  JVP not visible.  No LE edema.  Radial and DP pulses 2+ and symmetric. Respiratory: Normal respiratory rate and rhythm.  CTAB without rales or wheezes. Abdomen: Abdomen soft.  No TTP. No ascites, distension, hepatosplenomegaly.   MSK: No deformities or effusions.  No cyanosis or clubbing. Neuro: Cranial nerves grossly normal.  Sensation intact to light touch. Speech is fluent.  Muscle strength 5/5 and symmetric.    Psych: Sensorium  intact and responding to questions, attention normal.  Behavior appropriate.  Affect normal.  Judgment and insight appear normal.     Labs on Admission:  I have personally reviewed following labs and imaging studies: CBC:  Recent Labs Lab 03/05/17 1609  WBC 15.8*  NEUTROABS 11.8*  HGB 12.5  HCT 36.6  MCV 81.2  PLT 086   Basic Metabolic Panel:  Recent Labs Lab 03/05/17 1609  NA 135  K 3.1*  CL 101  CO2 22  GLUCOSE 136*  BUN 8  CREATININE 0.69  CALCIUM 9.4   GFR: Estimated Creatinine Clearance: 115.5 mL/min (by C-G formula based on SCr of 0.69 mg/dL).  Liver Function Tests:  Recent Labs Lab 03/05/17 1609  AST 25  ALT 19  ALKPHOS 62  BILITOT 0.3  PROT 7.5  ALBUMIN 3.9   No results for input(s): LIPASE, AMYLASE  in the last 168 hours. No results for input(s): AMMONIA in the last 168 hours. Coagulation Profile: No results for input(s): INR, PROTIME in the last 168 hours. Cardiac Enzymes: No results for input(s): CKTOTAL, CKMB, CKMBINDEX, TROPONINI in the last 168 hours. BNP (last 3 results) No results for input(s): PROBNP in the last 8760 hours. HbA1C: No results for input(s): HGBA1C in the last 72 hours. CBG: No results for input(s): GLUCAP in the last 168 hours. Lipid Profile: No results for input(s): CHOL, HDL, LDLCALC, TRIG, CHOLHDL, LDLDIRECT in the last 72 hours. Thyroid Function Tests: No results for input(s): TSH, T4TOTAL, FREET4, T3FREE, THYROIDAB in the last 72 hours. Anemia Panel: No results for input(s): VITAMINB12, FOLATE, FERRITIN, TIBC, IRON, RETICCTPCT in the last 72 hours. Sepsis Labs: Lactate pending Invalid input(s): PROCALCITONIN, LACTICIDVEN No results found for this or any previous visit (from the past 240 hour(s)).       Radiological Exams on Admission: Personally reviewed CT report: Ct Renal Stone Study  Result Date: 03/05/2017 CLINICAL DATA:  Left flank pain, urinary frequency, history of kidney stones EXAM: CT ABDOMEN AND  PELVIS WITHOUT CONTRAST TECHNIQUE: Multidetector CT imaging of the abdomen and pelvis was performed following the standard protocol without IV contrast. COMPARISON:  10/01/2013 FINDINGS: Lower chest: Lung bases are essentially clear. Hepatobiliary: Moderate hepatic steatosis, with focal fatty sparing along the gallbladder fossa. Gallbladder is underdistended. No intrahepatic or extrahepatic ductal dilatation. Pancreas: Within normal limits. Spleen: Within normal limits. Adrenals/Urinary Tract: Adrenal glands are within normal limits. Right kidney is within normal limits. Two nonobstructing left lower pole renal calculi measuring 2-3 mm (series 2/ images 41 and 43). Mild nonspecific perinephric stranding along the left lower kidney (series 2/image 45). No ureteral or bladder calculi.  No hydronephrosis. Mild nonspecific stranding along the proximal left ureter (series 2/image 57). Bladder is mildly thick-walled although underdistended. Stomach/Bowel: Stomach is within normal limits. No evidence of bowel obstruction. Normal appendix (series 2/image 56). Sigmoid diverticulosis, without evidence of diverticulitis. Vascular/Lymphatic: No evidence of abdominal aortic aneurysm. No suspicious abdominopelvic lymphadenopathy. Reproductive: Uterus is within normal limits. Bilateral ovaries are within normal limits. Other: No abdominopelvic ascites. Musculoskeletal: Visualized osseous structures are within normal limits. IMPRESSION: Two nonobstructing left lower pole renal calculi measuring 2-3 mm. No ureteral or bladder calculi. No hydronephrosis. Mild nonspecific perinephric stranding along the left lower kidney. Additional nonspecific stranding along the proximal left ureter. Differential considerations include recent passage of a ureteral calculus versus infection/pyelonephritis. Bladder is mildly thick-walled although underdistended. Correlate for cystitis. Electronically Signed   By: Julian Hy M.D.   On:  03/05/2017 16:32           Assessment/Plan  1. Acute pyelonephritis with nephrolithiasis:  CTX better coverage locally for nitrite producers, suspect infection so soon after Keflex is rather from seeding?   -Ceftriaxone IV  -Check lactic acid to rule out sepsis -Will discuss with Urology tomorrow, stones are small, no emergent obstruction on CT -Follow cultures -Toradol or morphine for pain, ondansetron for nausea   2. Type 2 diabetes and PCO S:  Euglycemic -Hold metformin -SSI with meals  3. Hypokalemia:  Mild. -IVF with K -Check mag  4. Hypertension:  -Continue amlodipine for now -Hold Cozaar until hemodynamics clearer -Continue statin  5. Depression:  -Continue sertraline and trazodone      DVT prophylaxis: Lovenox  Code Status: FULL  Family Communication: Husband and daughter at bedside  Disposition Plan: Anticipate iV fluids, empiric antibiotics.  If able to take PO, tachycardia  resolved by tomorrow, likely home after dsicussion with Urology, as patient has good outpatient follow up.  Otherwise will need conversion to inaptient and further fluids/IV antibtiotics. Consults called: None overnight Admission status: OBS At the point of initial evaluation, it is my clinical opinion that admission for OBSERVATION is reasonable and necessary because the patient's presenting complaints in the context of their chronic conditions represent sufficient risk of deterioration or significant morbidity to constitute reasonable grounds for close observation in the hospital setting, but that the patient may be medically stable for discharge from the hospital within 24 to 48 hours.    Medical decision making: Patient seen at 8:20 PM on 03/05/2017.  The patient was discussed with [facility], PA-C.  What exists of the patient's chart was reviewed in depth and summarized above.  Clinical condition: stable from hemodynamic standpoint at presents, still tachcyardic but BP good  and mentating well.        Edwin Dada [facility] Pager [phone]

## 2017-03-05 NOTE — ED Provider Notes (Signed)
[facility] DEPT Provider Note   CSN: [id number] Arrival date & time: 03/05/17  1433     History   Chief Complaint Chief Complaint  Patient presents with  . Flank Pain    HPI [name] is a 41 y.o. female.  The history is provided by the patient and medical records. No language interpreter was used.  Flank Pain    [name] is a 41 y.o. female  with a PMH of HTN, HLD, prior kidney stones who presents to the Emergency Department complaining of Dysuria and urinary frequency x 4 days. 3 days ago, she noticed some left flank and back pain which feels very similar to prior kidney stones. She thought it would pass on its own, however pain has been persistent. Pain will intermittently radiated across flank into the left groin. Initially, she thought she may have a urinary tract infection, however she was on Bactrim and Keflex for a skin infection (completed course on Monday 6 days ago), so she thought this would knock out any type of UTI. Dysuria has somewhat improved, however she still is having hesitancy with urination. No fever or chills. No nausea, vomiting or diarrhea. No medications taken for pain prior to arrival. No alleviating or aggravating factors noted. Patient states that she has a history of kidney stones and this feels similar.  Past Medical History:  Diagnosis Date  . Anxiety   . Carpal tunnel syndrome of left wrist 11/2015  . Chronic low back pain   . Cough 12/08/2015  . Depression   . History of kidney stones   . Hyperlipemia 12/02/2015  . Hypertension    states under control with med., has been on med. < 9 mos.  . Migraines   . Non-insulin dependent type 2 diabetes mellitus ([place])   . Polycystic ovarian syndrome   . Renal disorder   . Tension headache, chronic     Patient Active Problem List   Diagnosis Date Noted  . Acute pyelonephritis 03/05/2017  . Essential thrombocytosis ([place]) 06/11/2016  . Leukocytosis 06/04/2016  . Reactive thrombocytosis  06/04/2016  . Essential hypertension 03/29/2016  . FH: melanoma 03/29/2016  . Recurrent major depressive disorder, in full remission ([place]) 12/02/2015  . Hyperlipemia 12/02/2015  . Type 2 diabetes mellitus without complication, without long-term current use of insulin ([place]) 12/02/2015  . HGSIL (high grade squamous intraepithelial dysplasia) 03/16/2015  . PCOS (polycystic ovarian syndrome) 11/08/2012  . Class 3 obesity with serious comorbidity and body mass index (BMI) of 50.0 to 59.9 in adult [facility]) 11/08/2012    Past Surgical History:  Procedure Laterality Date  . CARPAL TUNNEL RELEASE Right 09/16/2015   Procedure: RIGHT CARPAL TUNNEL RELEASE;  Surgeon: Leanora Cover, MD;  Location: [facility];  Service: Orthopedics;  Laterality: Right;  . CARPAL TUNNEL RELEASE Left 12/12/2015   Procedure: LEFT CARPAL TUNNEL RELEASE;  Surgeon: Leanora Cover, MD;  Location: [facility];  Service: Orthopedics;  Laterality: Left;  . CESAREAN SECTION  05/06/2007  . EXTRACORPOREAL SHOCK WAVE LITHOTRIPSY  11/22/2011  . LEEP N/A 03/17/2015   Procedure: LOOP ELECTROSURGICAL EXCISION PROCEDURE (LEEP) and Cold Knife [place];  Surgeon: Eldred Manges, MD;  Location: [facility];  Service: Gynecology;  Laterality: N/A;  . WISDOM TOOTH EXTRACTION      OB History    Gravida Para Term Preterm AB Living   1 1       1    SAB TAB Ectopic Multiple Live Births  Home Medications    Prior to Admission medications   Medication Sig Start Date End Date Taking? Authorizing Provider  amLODipine (NORVASC) 5 MG tablet TAKE 1 TABLET BY MOUTH  DAILY 02/10/17  Yes Colin Benton R, DO  ibuprofen (ADVIL,MOTRIN) 200 MG tablet Take 400 mg by mouth every 6 (six) hours as needed (pain).   Yes [provider]  losartan (COZAAR) 100 MG tablet TAKE 1 TABLET BY MOUTH  DAILY 11/09/16  Yes Colin Benton R, DO  Menthol (ICY HOT) 5 % PTCH Apply 1 patch topically as needed (pain).   Yes [provider]  metFORMIN (GLUCOPHAGE) 1000 MG tablet Take 1 tablet (1,000 mg total) by mouth 2 (two) times daily with a meal. 02/22/17  Yes Martinique, Betty G, MD  norethindrone (HEATHER) 0.35 MG tablet Take 1 tablet by mouth daily.   Yes [provider]  rosuvastatin (CRESTOR) 10 MG tablet TAKE 1 TABLET BY MOUTH  DAILY 12/27/16  Yes Colin Benton R, DO  sertraline (ZOLOFT) 100 MG tablet Take 100 mg by mouth daily.    Yes [provider]  traZODone (DESYREL) 150 MG tablet Take 150 mg by mouth at bedtime.     Yes [provider]    Family History Family History  Problem Relation Age of Onset  . Arthritis Mother   . Breast cancer Paternal Aunt   . Mental illness Father   . Melanoma Father     Social History Social History  Substance Use Topics  . Smoking status: Former Smoker    Packs/day: 0.00    Quit date: 09/07/1998  . Smokeless tobacco: Never Used  . Alcohol use Yes     Comment: occasionally     Allergies   Patient has no known allergies.   Review of Systems Review of Systems  Genitourinary: Positive for difficulty urinating, dysuria, flank pain and frequency. Negative for pelvic pain, vaginal bleeding, vaginal discharge and vaginal pain.  Musculoskeletal: Positive for back pain.  All other systems reviewed and are negative.    Physical Exam Updated Vital Signs BP (!) 165/86 (BP Location: Left Arm)   Pulse (!) 110   Temp 99 F (37.2 C) (Oral)   Resp 20   SpO2 99%   Physical Exam  Constitutional: She is oriented to person, place, and time. She appears well-developed and well-nourished.  Non-toxic appearing obese female in NAD.  HENT:  Head: Normocephalic and atraumatic.  Cardiovascular: Normal heart sounds.   No murmur heard. Tachycardic but regular.  Pulmonary/Chest: Effort normal and breath sounds normal. No respiratory distress.  Abdominal: Soft. Bowel sounds are normal. She exhibits no distension.  Tenderness to palpation along left  flank and lower quadrant. No overlying skin changes. No rebound or guarding. No CVA tenderness.  Neurological: She is alert and oriented to person, place, and time.  Skin: Skin is warm and dry.  Nursing note and vitals reviewed.    ED Treatments / Results  Labs (all labs ordered are listed, but only abnormal results are displayed) Labs Reviewed  URINALYSIS, ROUTINE W REFLEX MICROSCOPIC - Abnormal; Notable for the following:       Result Value   Color, Urine AMBER (*)    APPearance CLOUDY (*)    Hgb urine dipstick MODERATE (*)    Protein, ur 100 (*)    Nitrite POSITIVE (*)    Leukocytes, UA LARGE (*)    Bacteria, UA MANY (*)    Squamous Epithelial / LPF 6-30 (*)  All other components within normal limits  COMPREHENSIVE METABOLIC PANEL - Abnormal; Notable for the following:    Potassium 3.1 (*)    Glucose, Bld 136 (*)    All other components within normal limits  CBC WITH DIFFERENTIAL/PLATELET - Abnormal; Notable for the following:    WBC 15.8 (*)    Neutro Abs 11.8 (*)    Monocytes Absolute 1.2 (*)    All other components within normal limits  URINE CULTURE  CULTURE, BLOOD (SINGLE)  CULTURE, BLOOD (ROUTINE X 2)  CULTURE, BLOOD (ROUTINE X 2)  PREGNANCY, URINE  LACTIC ACID, PLASMA  I-STAT CG4 LACTIC ACID, ED    EKG  EKG Interpretation None       Radiology Ct Renal Stone Study  Result Date: 03/05/2017 CLINICAL DATA:  Left flank pain, urinary frequency, history of kidney stones EXAM: CT ABDOMEN AND PELVIS WITHOUT CONTRAST TECHNIQUE: Multidetector CT imaging of the abdomen and pelvis was performed following the standard protocol without IV contrast. COMPARISON:  10/01/2013 FINDINGS: Lower chest: Lung bases are essentially clear. Hepatobiliary: Moderate hepatic steatosis, with focal fatty sparing along the gallbladder fossa. Gallbladder is underdistended. No intrahepatic or extrahepatic ductal dilatation. Pancreas: Within normal limits. Spleen: Within normal limits.  Adrenals/Urinary Tract: Adrenal glands are within normal limits. Right kidney is within normal limits. Two nonobstructing left lower pole renal calculi measuring 2-3 mm (series 2/ images 41 and 43). Mild nonspecific perinephric stranding along the left lower kidney (series 2/image 45). No ureteral or bladder calculi.  No hydronephrosis. Mild nonspecific stranding along the proximal left ureter (series 2/image 57). Bladder is mildly thick-walled although underdistended. Stomach/Bowel: Stomach is within normal limits. No evidence of bowel obstruction. Normal appendix (series 2/image 56). Sigmoid diverticulosis, without evidence of diverticulitis. Vascular/Lymphatic: No evidence of abdominal aortic aneurysm. No suspicious abdominopelvic lymphadenopathy. Reproductive: Uterus is within normal limits. Bilateral ovaries are within normal limits. Other: No abdominopelvic ascites. Musculoskeletal: Visualized osseous structures are within normal limits. IMPRESSION: Two nonobstructing left lower pole renal calculi measuring 2-3 mm. No ureteral or bladder calculi. No hydronephrosis. Mild nonspecific perinephric stranding along the left lower kidney. Additional nonspecific stranding along the proximal left ureter. Differential considerations include recent passage of a ureteral calculus versus infection/pyelonephritis. Bladder is mildly thick-walled although underdistended. Correlate for cystitis. Electronically Signed   By: Julian Hy M.D.   On: 03/05/2017 16:32    Procedures Procedures (including critical care time)  Medications Ordered in ED Medications  sodium chloride 0.9 % bolus 1,000 mL (0 mLs Intravenous Stopped 03/05/17 1810)  morphine 4 MG/ML injection 4 mg (4 mg Intravenous Given 03/05/17 1643)  acetaminophen (TYLENOL) tablet 650 mg (650 mg Oral Given 03/05/17 1739)  potassium chloride SA (K-DUR,KLOR-CON) CR tablet 40 mEq (40 mEq Oral Given 03/05/17 1815)  levofloxacin (LEVAQUIN) tablet 750 mg (750 mg  Oral Given 03/05/17 1815)  sodium chloride 0.9 % bolus 1,000 mL (0 mLs Intravenous Stopped 03/05/17 1956)  oxyCODONE-acetaminophen (PERCOCET/ROXICET) 5-325 MG per tablet 2 tablet (2 tablets Oral Given 03/05/17 1854)  morphine 4 MG/ML injection 4 mg (4 mg Intravenous Given 03/05/17 1957)  ondansetron (ZOFRAN) injection 4 mg (4 mg Intravenous Given 03/05/17 1957)  0.9 %  sodium chloride infusion ( Intravenous New Bag/Given 03/05/17 1956)     Initial Impression / Assessment and Plan / ED Course  I have reviewed the triage vital signs and the nursing notes.  Pertinent labs & imaging results that were available during my care of the patient were reviewed by me and considered in my  medical decision making (see chart for details).    [name] is a 41 y.o. female who presents to ED for left flank pain and dysuria, urinary urgency and hesitancy. She has hx of kidney stones and states flank pain today feels similar however she does not typically have urinary symptoms. Initially afebrile with HR in 100's-100's. Repeat temperature of 100.3. No nausea or vomiting. No CVA tenderness, although does have tenderness to the suprapubic, left flank and left low back. Will obtain labs, urine, CT. 1 L fluids and pain meds given as well.   UA nitrite + with large leuks and TNTC white cells. Elevated WBC at 15.8. CMP with K+ of 3.1 - replenished in ED today.   CT reviewed:  IMPRESSION: Two nonobstructing left lower pole renal calculi measuring 2-3 mm. No ureteral or bladder calculi. No hydronephrosis.  Mild nonspecific perinephric stranding along the left lower kidney. Additional nonspecific stranding along the proximal left ureter. Differential considerations include recent passage of a ureteral calculus versus infection/pyelonephritis.  Bladder is mildly thick-walled although underdistended. Correlate for cystitis.   Required IV pain control. Attempted to transition to PO pain meds, however patient still  appears very uncomfortable and shortly after had episode of emesis. Still tachycardic despite 2L IV fluids. Hospitalist consulted for admission.    Final Clinical Impressions(s) / ED Diagnoses   Final diagnoses:  Pyelonephritis    New Prescriptions New Prescriptions   No medications on file     Ward, Ozella Almond, Hershal Coria 03/05/17 2027    Tegeler, Gwenyth Allegra, MD 03/06/17 [phone]

## 2017-03-06 DIAGNOSIS — N10 Acute tubulo-interstitial nephritis: Secondary | ICD-10-CM | POA: Diagnosis not present

## 2017-03-06 DIAGNOSIS — E282 Polycystic ovarian syndrome: Secondary | ICD-10-CM | POA: Diagnosis not present

## 2017-03-06 DIAGNOSIS — E119 Type 2 diabetes mellitus without complications: Secondary | ICD-10-CM | POA: Diagnosis not present

## 2017-03-06 DIAGNOSIS — I10 Essential (primary) hypertension: Secondary | ICD-10-CM | POA: Diagnosis not present

## 2017-03-06 DIAGNOSIS — F3342 Major depressive disorder, recurrent, in full remission: Secondary | ICD-10-CM | POA: Diagnosis not present

## 2017-03-06 DIAGNOSIS — E876 Hypokalemia: Secondary | ICD-10-CM | POA: Diagnosis not present

## 2017-03-06 LAB — BASIC METABOLIC PANEL
ANION GAP: 8 (ref 5–15)
BUN: 6 mg/dL (ref 6–20)
CALCIUM: 7.7 mg/dL — AB (ref 8.9–10.3)
CO2: 26 mmol/L (ref 22–32)
CREATININE: 0.63 mg/dL (ref 0.44–1.00)
Chloride: 104 mmol/L (ref 101–111)
GFR calc Af Amer: >60 mL/min (ref >60)
GLUCOSE: 125 mg/dL — AB (ref 65–99)
Potassium: 3.2 mmol/L — ABNORMAL LOW (ref 3.5–5.1)
Sodium: 138 mmol/L (ref 135–145)

## 2017-03-06 LAB — GLUCOSE, CAPILLARY
GLUCOSE-CAPILLARY: 130 mg/dL — AB (ref 65–99)
GLUCOSE-CAPILLARY: 115 mg/dL — AB (ref 65–99)
Glucose-Capillary: 86 mg/dL (ref 65–99)
Glucose-Capillary: 78 mg/dL (ref 65–99)

## 2017-03-06 LAB — CBC
HCT: 34.2 % — ABNORMAL LOW (ref 36.0–46.0)
Hemoglobin: 11.4 g/dL — ABNORMAL LOW (ref 12.0–15.0)
MCH: 27.7 pg (ref 26.0–34.0)
MCHC: 33.3 g/dL (ref 30.0–36.0)
MCV: 83 fL (ref 78.0–100.0)
PLATELETS: 332 10*3/uL (ref 150–400)
RBC: 4.12 MIL/uL (ref 3.87–5.11)
RDW: 15.1 % (ref 11.5–15.5)
WBC: 17.7 10*3/uL — AB (ref 4.0–10.5)

## 2017-03-06 MED ORDER — POTASSIUM CHLORIDE CRYS ER 20 MEQ PO TBCR
40.0000 meq | EXTENDED_RELEASE_TABLET | Freq: Two times a day (BID) | ORAL | Status: AC
  Administered 2017-03-06 (×2): 40 meq via ORAL
  Filled 2017-03-06 (×2): qty 2

## 2017-03-06 MED ORDER — DEXTROSE 5 % IV SOLN
2.0000 g | INTRAVENOUS | Status: DC
  Administered 2017-03-06 – 2017-03-07 (×2): 2 g via INTRAVENOUS
  Filled 2017-03-06 (×3): qty 2

## 2017-03-06 MED ORDER — SODIUM CHLORIDE 0.9 % IV SOLN
Freq: Once | INTRAVENOUS | Status: AC
  Administered 2017-03-06: 09:00:00 via INTRAVENOUS

## 2017-03-06 MED ORDER — PHENAZOPYRIDINE HCL 100 MG PO TABS
100.0000 mg | ORAL_TABLET | Freq: Three times a day (TID) | ORAL | Status: DC
  Administered 2017-03-06 – 2017-03-08 (×6): 100 mg via ORAL
  Filled 2017-03-06 (×9): qty 1

## 2017-03-06 MED ORDER — MAGNESIUM SULFATE 2 GM/50ML IV SOLN
2.0000 g | Freq: Once | INTRAVENOUS | Status: AC
  Administered 2017-03-06: 2 g via INTRAVENOUS
  Filled 2017-03-06: qty 50

## 2017-03-06 NOTE — Plan of Care (Signed)
Problem: Physical Regulation: Goal: Will remain free from infection Outcome: Progressing On IV abx.

## 2017-03-06 NOTE — Progress Notes (Signed)
PROGRESS NOTE    [name]  TOI:[id number] DOB: 14-Aug-1976 DOA: 03/05/2017 PCP: Martinique, Betty G, MD    Brief Narrative:  41 y.o. female with a past medical history significant for PCOS/NIDDM, HTN, obesity, hx of nephrolithiasis who presents with 4 days flank pain and dysuria.  The patient was in her usual state of health until Monday when she started to develop urinary urgency, having to "go all the time", dysuria at the end of urination, and left-sided flank pain. For the last 3 weeks, she had had a breast abscess for which she was initially treated with 7 days of Keflex which she completed around 12 days ago followed by 7 days of Bactrim which she completed on Monday.  Over this week, the dysuria persisted, her pain was moderate but progressed to intense, came in "waves" and finally today was severe, more frequent or constant, and associated with nausea and chills so she came to the ER.   Assessment & Plan:   Principal Problem:   Acute pyelonephritis Active Problems:   PCOS (polycystic ovarian syndrome)   Class 3 obesity with serious comorbidity and body mass index (BMI) of 50.0 to 59.9 in adult [facility])   Recurrent major depressive disorder, in full remission ([place])   Type 2 diabetes mellitus without complication, without long-term current use of insulin ([facility])   Essential hypertension   Hypokalemia  1. Acute pyelonephritis with nephrolithiasis:  - Rocephin continued  -Lactic acid normal -CT reviewed. No signs of ureteral obstruction. No hydronephrosis -Urine culture pending -Continue analgesics as tolerated - Will add pyridium for discomfort  2. Type 2 diabetes and PCO S:  - Euglycemic -Holding metformin -Continue SSI with meals  3. Hypokalemia/hypomagnesemia:  -replace Mg -Replace potassium  4. Hypertension:  -Continued amlodipine for now -Hold Cozaar until hemodynamics clearer -Continued statin -Stable  5. Depression:  -Continue sertraline and  trazodone -Stable at present  DVT prophylaxis: Lovenox subQ Code Status: Full Family Communication: Pt in room, family not at bedside Disposition Plan: Possible d/c home in 24-48hrs  Consultants:     Procedures:     Antimicrobials: Anti-infectives    Start     Dose/Rate Route Frequency Ordered Stop   03/06/17 2200  cefTRIAXone (ROCEPHIN) 2 g in dextrose 5 % 50 mL IVPB     2 g 100 mL/hr over 30 Minutes Intravenous Every 24 hours 03/06/17 0654     03/05/17 2100  cefTRIAXone (ROCEPHIN) 2 g in dextrose 5 % 50 mL IVPB     2 g 100 mL/hr over 30 Minutes Intravenous  Once 03/05/17 2036 03/05/17 2248   03/05/17 1800  levofloxacin (LEVAQUIN) tablet 750 mg     750 mg Oral  Once 03/05/17 1755 03/05/17 1815       Subjective: No complaints this AM  Objective: Vitals:   03/05/17 2130 03/06/17 0003 03/06/17 0547 03/06/17 0943  BP: 139/75  123/66 128/66  Pulse: (!) 129 (!) 112 94 96  Resp: 20 20 20 16   Temp: 99.9 F (37.7 C) 98.9 F (37.2 C) 98.3 F (36.8 C)   TempSrc: Oral Oral Oral   SpO2: 91% 92% 97% 94%  Weight: 124.3 kg (274 lb)     Height: 5' (1.524 m)       Intake/Output Summary (Last 24 hours) at 03/06/17 1506 Last data filed at 03/06/17 1400  Gross per 24 hour  Intake          2736.25 ml  Output  300 ml  Net          2436.25 ml   Filed Weights   03/05/17 2130  Weight: 124.3 kg (274 lb)    Examination:  General exam: Appears calm and comfortable  Respiratory system: Clear to auscultation. Respiratory effort normal. Cardiovascular system: S1 & S2 heard, RRR.  Gastrointestinal system: Abdomen is nondistended, soft and nontender. No organomegaly or masses felt. Normal bowel sounds heard. Central nervous system: Alert and oriented. No focal neurological deficits. Extremities: Symmetric 5 x 5 power. Skin: No rashes, lesions  Psychiatry: Judgement and insight appear normal. Mood & affect appropriate.   Data Reviewed: I have personally reviewed  following labs and imaging studies  CBC:  Recent Labs Lab 03/05/17 1609 03/06/17 0517  WBC 15.8* 17.7*  NEUTROABS 11.8*  --   HGB 12.5 11.4*  HCT 36.6 34.2*  MCV 81.2 83.0  PLT 392 161   Basic Metabolic Panel:  Recent Labs Lab 03/05/17 1609 03/06/17 0517  NA 135 138  K 3.1* 3.2*  CL 101 104  CO2 22 26  GLUCOSE 136* 125*  BUN 8 6  CREATININE 0.69 0.63  CALCIUM 9.4 7.7*  MG 1.7  --    GFR: Estimated Creatinine Clearance: 113.6 mL/min (by C-G formula based on SCr of 0.63 mg/dL). Liver Function Tests:  Recent Labs Lab 03/05/17 1609  AST 25  ALT 19  ALKPHOS 62  BILITOT 0.3  PROT 7.5  ALBUMIN 3.9   No results for input(s): LIPASE, AMYLASE in the last 168 hours. No results for input(s): AMMONIA in the last 168 hours. Coagulation Profile: No results for input(s): INR, PROTIME in the last 168 hours. Cardiac Enzymes: No results for input(s): CKTOTAL, CKMB, CKMBINDEX, TROPONINI in the last 168 hours. BNP (last 3 results) No results for input(s): PROBNP in the last 8760 hours. HbA1C: No results for input(s): HGBA1C in the last 72 hours. CBG:  Recent Labs Lab 03/05/17 2126 03/06/17 0725 03/06/17 1204  GLUCAP 132* 130* 86   Lipid Profile: No results for input(s): CHOL, HDL, LDLCALC, TRIG, CHOLHDL, LDLDIRECT in the last 72 hours. Thyroid Function Tests: No results for input(s): TSH, T4TOTAL, FREET4, T3FREE, THYROIDAB in the last 72 hours. Anemia Panel: No results for input(s): VITAMINB12, FOLATE, FERRITIN, TIBC, IRON, RETICCTPCT in the last 72 hours. Sepsis Labs:  Recent Labs Lab 03/05/17 2004  LATICACIDVEN 1.8    No results found for this or any previous visit (from the past 240 hour(s)).   Radiology Studies: Ct Renal Stone Study  Result Date: 03/05/2017 CLINICAL DATA:  Left flank pain, urinary frequency, history of kidney stones EXAM: CT ABDOMEN AND PELVIS WITHOUT CONTRAST TECHNIQUE: Multidetector CT imaging of the abdomen and pelvis was performed  following the standard protocol without IV contrast. COMPARISON:  10/01/2013 FINDINGS: Lower chest: Lung bases are essentially clear. Hepatobiliary: Moderate hepatic steatosis, with focal fatty sparing along the gallbladder fossa. Gallbladder is underdistended. No intrahepatic or extrahepatic ductal dilatation. Pancreas: Within normal limits. Spleen: Within normal limits. Adrenals/Urinary Tract: Adrenal glands are within normal limits. Right kidney is within normal limits. Two nonobstructing left lower pole renal calculi measuring 2-3 mm (series 2/ images 41 and 43). Mild nonspecific perinephric stranding along the left lower kidney (series 2/image 45). No ureteral or bladder calculi.  No hydronephrosis. Mild nonspecific stranding along the proximal left ureter (series 2/image 57). Bladder is mildly thick-walled although underdistended. Stomach/Bowel: Stomach is within normal limits. No evidence of bowel obstruction. Normal appendix (series 2/image 56). Sigmoid diverticulosis, without evidence  of diverticulitis. Vascular/Lymphatic: No evidence of abdominal aortic aneurysm. No suspicious abdominopelvic lymphadenopathy. Reproductive: Uterus is within normal limits. Bilateral ovaries are within normal limits. Other: No abdominopelvic ascites. Musculoskeletal: Visualized osseous structures are within normal limits. IMPRESSION: Two nonobstructing left lower pole renal calculi measuring 2-3 mm. No ureteral or bladder calculi. No hydronephrosis. Mild nonspecific perinephric stranding along the left lower kidney. Additional nonspecific stranding along the proximal left ureter. Differential considerations include recent passage of a ureteral calculus versus infection/pyelonephritis. Bladder is mildly thick-walled although underdistended. Correlate for cystitis. Electronically Signed   By: Julian Hy M.D.   On: 03/05/2017 16:32    Scheduled Meds: . amLODipine  5 mg Oral Daily  . enoxaparin (LOVENOX) injection  60  mg Subcutaneous QHS  . insulin aspart  0-15 Units Subcutaneous TID WC  . insulin aspart  0-5 Units Subcutaneous QHS  . phenazopyridine  100 mg Oral TID WC  . potassium chloride  40 mEq Oral BID  . rosuvastatin  10 mg Oral Daily  . sertraline  100 mg Oral Daily  . traZODone  150 mg Oral QHS   Continuous Infusions: . cefTRIAXone (ROCEPHIN)  IV       LOS: 0 days   Delaine Canter, Orpah Melter, MD [facility] Pager [phone]  If 7PM-7AM, please contact night-coverage [email or web address] Password [email or web address] 03/06/2017, 3:06 PM

## 2017-03-06 NOTE — Progress Notes (Signed)
Pharmacy Antibiotic Note  [name] is a 41 y.o. female admitted on 03/05/2017 with acute pyelonephritis in setting of nephrolithiasis.  Pharmacy has been consulted for ceftriaxone dosing.  Patient received Levaquin 750 mg PO x 1 and ceftriaxone 2 grams IV x 1 on the night of 03/05/17   Plan: Ceftriaxone 2 grams IV q24h. F/U on blood and urine cultures. Will sign off from note writing but will track culture results.  Height: 5' (152.4 cm) Weight: 274 lb (124.3 kg) IBW/kg (Calculated) : 45.5  Temp (24hrs), Avg:99.2 F (37.3 C), Min:98.3 F (36.8 C), Max:100.3 F (37.9 C)   Recent Labs Lab 03/05/17 1609 03/05/17 2004 03/06/17 0517  WBC 15.8*  --  17.7*  CREATININE 0.69  --  0.63  LATICACIDVEN  --  1.8  --     Estimated Creatinine Clearance: 113.6 mL/min (by C-G formula based on SCr of 0.63 mg/dL).    No Known Allergies  Antimicrobials this admission:  5/12>>Levaquin 750 mg PO x 1 5/12>>Ceftriaxone>>  Dose adjustments this admission:  N/A  Microbiology results:  5/12 BCx: collected 5/12 UCx: collected    Thank you for allowing pharmacy to be a part of this patient's care.  Clayburn Pert, PharmD, BCPS Pager: [phone] 03/06/2017  7:29 AM

## 2017-03-07 DIAGNOSIS — E876 Hypokalemia: Secondary | ICD-10-CM | POA: Diagnosis not present

## 2017-03-07 DIAGNOSIS — E282 Polycystic ovarian syndrome: Secondary | ICD-10-CM | POA: Diagnosis not present

## 2017-03-07 DIAGNOSIS — I10 Essential (primary) hypertension: Secondary | ICD-10-CM | POA: Diagnosis not present

## 2017-03-07 DIAGNOSIS — N10 Acute tubulo-interstitial nephritis: Secondary | ICD-10-CM | POA: Diagnosis not present

## 2017-03-07 LAB — BASIC METABOLIC PANEL
Anion gap: 11 (ref 5–15)
CHLORIDE: 101 mmol/L (ref 101–111)
CO2: 25 mmol/L (ref 22–32)
CREATININE: 0.65 mg/dL (ref 0.44–1.00)
Calcium: 8.5 mg/dL — ABNORMAL LOW (ref 8.9–10.3)
GFR calc Af Amer: >60 mL/min (ref >60)
GFR calc non Af Amer: >60 mL/min (ref >60)
GLUCOSE: 133 mg/dL — AB (ref 65–99)
Potassium: 3.5 mmol/L (ref 3.5–5.1)
SODIUM: 137 mmol/L (ref 135–145)

## 2017-03-07 LAB — CBC
HCT: 36 % (ref 36.0–46.0)
Hemoglobin: 11.9 g/dL — ABNORMAL LOW (ref 12.0–15.0)
MCH: 27.4 pg (ref 26.0–34.0)
MCHC: 33.1 g/dL (ref 30.0–36.0)
MCV: 82.9 fL (ref 78.0–100.0)
PLATELETS: 339 10*3/uL (ref 150–400)
RBC: 4.34 MIL/uL (ref 3.87–5.11)
RDW: 14.9 % (ref 11.5–15.5)
WBC: 12.9 10*3/uL — AB (ref 4.0–10.5)

## 2017-03-07 LAB — GLUCOSE, CAPILLARY
GLUCOSE-CAPILLARY: 117 mg/dL — AB (ref 65–99)
Glucose-Capillary: 108 mg/dL — ABNORMAL HIGH (ref 65–99)
Glucose-Capillary: 125 mg/dL — ABNORMAL HIGH (ref 65–99)
Glucose-Capillary: 99 mg/dL (ref 65–99)

## 2017-03-07 LAB — MAGNESIUM: Magnesium: 2.2 mg/dL (ref 1.7–2.4)

## 2017-03-07 NOTE — Progress Notes (Signed)
PROGRESS NOTE    [name]  WLN:[id number] DOB: 10/01/1976 DOA: 03/05/2017 PCP: Martinique, Betty G, MD    Brief Narrative:  41 y.o. female with a past medical history significant for PCOS/NIDDM, HTN, obesity, hx of nephrolithiasis who presents with 4 days flank pain and dysuria.  The patient was in her usual state of health until Monday when she started to develop urinary urgency, having to "go all the time", dysuria at the end of urination, and left-sided flank pain. For the last 3 weeks, she had had a breast abscess for which she was initially treated with 7 days of Keflex which she completed around 12 days ago followed by 7 days of Bactrim which she completed on Monday.  Over this week, the dysuria persisted, her pain was moderate but progressed to intense, came in "waves" and finally today was severe, more frequent or constant, and associated with nausea and chills so she came to the ER.   Assessment & Plan:   Principal Problem:   Acute pyelonephritis Active Problems:   PCOS (polycystic ovarian syndrome)   Class 3 obesity with serious comorbidity and body mass index (BMI) of 50.0 to 59.9 in adult [facility])   Recurrent major depressive disorder, in full remission ([place])   Type 2 diabetes mellitus without complication, without long-term current use of insulin (HCC)   Essential hypertension   Hypokalemia  1. Acute ecoli pyelonephritis with nephrolithiasis:  - Rocephin continued  -Lactic acid normal -CT reviewed. No signs of ureteral obstruction. No hydronephrosis -Urine culture pos for ecoli, sensitivites are pending -Continue analgesics as tolerated - Dysuria improved with pyridium  2. Type 2 diabetes and PCO S:  - Euglycemic -Holding metformin -will continue with SSI with meals  3. Hypokalemia/hypomagnesemia:  -replaced Mg -Replaced potassium -repeat bmet in AM  4. Hypertension:  -Have continued amlodipine for now -Cozaar held at time of admission -Continued  statin --Presently stable  5. Depression:  -Continue sertraline and trazodone -Remains stable at present  DVT prophylaxis: Lovenox subQ Code Status: Full Family Communication: Pt in room, family not at bedside Disposition Plan: Possible d/c home in 24hrs, pending culture sensitivities  Consultants:     Procedures:     Antimicrobials: Anti-infectives    Start     Dose/Rate Route Frequency Ordered Stop   03/06/17 2200  cefTRIAXone (ROCEPHIN) 2 g in dextrose 5 % 50 mL IVPB     2 g 100 mL/hr over 30 Minutes Intravenous Every 24 hours 03/06/17 0654     03/05/17 2100  cefTRIAXone (ROCEPHIN) 2 g in dextrose 5 % 50 mL IVPB     2 g 100 mL/hr over 30 Minutes Intravenous  Once 03/05/17 2036 03/05/17 2248   03/05/17 1800  levofloxacin (LEVAQUIN) tablet 750 mg     750 mg Oral  Once 03/05/17 1755 03/05/17 1815      Subjective: Feeling better today. No complaints  Objective: Vitals:   03/06/17 1507 03/06/17 2114 03/07/17 0520 03/07/17 0956  BP: 134/68 139/77 134/81 132/82  Pulse: (!) 107 (!) 103 90   Resp: 17 20 14    Temp: 98.9 F (37.2 C) (!) 100.7 F (38.2 C) 98.1 F (36.7 C)   TempSrc: Oral Oral Oral   SpO2: 96% 94% 94%   Weight:      Height:        Intake/Output Summary (Last 24 hours) at 03/07/17 1632 Last data filed at 03/07/17 0956  Gross per 24 hour  Intake  1147.08 ml  Output             2525 ml  Net         -1377.92 ml   Filed Weights   03/05/17 2130  Weight: 124.3 kg (274 lb)    Examination:  General exam: Awake, laying in bed, in nad Respiratory system: Normal respiratory effort, no wheezing Cardiovascular system: regular rate, s1, s2 Gastrointestinal system: Morbidly obese, Soft, nondistended, positive BS Central nervous system: CN2-12 grossly intact, strength intact Extremities: Perfused, no clubbing Skin: Normal skin turgor, no notable skin lesions seen Psychiatry: Mood normal // no visual hallucinations   Data Reviewed: I have  personally reviewed following labs and imaging studies  CBC:  Recent Labs Lab 03/05/17 1609 03/06/17 0517 03/07/17 0409  WBC 15.8* 17.7* 12.9*  NEUTROABS 11.8*  --   --   HGB 12.5 11.4* 11.9*  HCT 36.6 34.2* 36.0  MCV 81.2 83.0 82.9  PLT 392 332 782   Basic Metabolic Panel:  Recent Labs Lab 03/05/17 1609 03/06/17 0517 03/07/17 0409  NA 135 138 137  K 3.1* 3.2* 3.5  CL 101 104 101  CO2 22 26 25   GLUCOSE 136* 125* 133*  BUN 8 6 <5*  CREATININE 0.69 0.63 0.65  CALCIUM 9.4 7.7* 8.5*  MG 1.7  --  2.2   GFR: Estimated Creatinine Clearance: 113.6 mL/min (by C-G formula based on SCr of 0.65 mg/dL). Liver Function Tests:  Recent Labs Lab 03/05/17 1609  AST 25  ALT 19  ALKPHOS 62  BILITOT 0.3  PROT 7.5  ALBUMIN 3.9   No results for input(s): LIPASE, AMYLASE in the last 168 hours. No results for input(s): AMMONIA in the last 168 hours. Coagulation Profile: No results for input(s): INR, PROTIME in the last 168 hours. Cardiac Enzymes: No results for input(s): CKTOTAL, CKMB, CKMBINDEX, TROPONINI in the last 168 hours. BNP (last 3 results) No results for input(s): PROBNP in the last 8760 hours. HbA1C: No results for input(s): HGBA1C in the last 72 hours. CBG:  Recent Labs Lab 03/06/17 1204 03/06/17 1652 03/06/17 2117 03/07/17 0802 03/07/17 1233  GLUCAP 86 78 115* 108* 117*   Lipid Profile: No results for input(s): CHOL, HDL, LDLCALC, TRIG, CHOLHDL, LDLDIRECT in the last 72 hours. Thyroid Function Tests: No results for input(s): TSH, T4TOTAL, FREET4, T3FREE, THYROIDAB in the last 72 hours. Anemia Panel: No results for input(s): VITAMINB12, FOLATE, FERRITIN, TIBC, IRON, RETICCTPCT in the last 72 hours. Sepsis Labs:  Recent Labs Lab 03/05/17 2004  LATICACIDVEN 1.8    Recent Results (from the past 240 hour(s))  Urine culture     Status: Abnormal (Preliminary result)   Collection Time: 03/05/17  3:02 PM  Result Value Ref Range Status   Specimen  Description URINE, CLEAN CATCH  Final   Special Requests NONE  Final   Culture (A)  Final    >=100,000 COLONIES/mL ESCHERICHIA COLI SUSCEPTIBILITIES TO FOLLOW Performed at [facility], [address] [place], [place] [address]    Report Status PENDING  Incomplete  Culture, blood (single)     Status: None (Preliminary result)   Collection Time: 03/05/17  8:40 PM  Result Value Ref Range Status   Specimen Description BLOOD RIGHT HAND  Final   Special Requests   Final    BOTTLES DRAWN AEROBIC AND ANAEROBIC Blood Culture adequate volume   Culture   Final    NO GROWTH 1 DAY Performed at [facility], [place] [address]  [place], [place] [address]    Report Status PENDING  Incomplete     Radiology Studies: No results found.  Scheduled Meds: . amLODipine  5 mg Oral Daily  . enoxaparin (LOVENOX) injection  60 mg Subcutaneous QHS  . insulin aspart  0-15 Units Subcutaneous TID WC  . insulin aspart  0-5 Units Subcutaneous QHS  . phenazopyridine  100 mg Oral TID WC  . rosuvastatin  10 mg Oral Daily  . sertraline  100 mg Oral Daily  . traZODone  150 mg Oral QHS   Continuous Infusions: . cefTRIAXone (ROCEPHIN)  IV Stopped (03/06/17 2227)     LOS: 0 days   CHIU, Orpah Melter, MD [facility] Pager [phone]  If 7PM-7AM, please contact night-coverage [email or web address] Password [facility] 03/07/2017, 4:32 PM

## 2017-03-08 DIAGNOSIS — I10 Essential (primary) hypertension: Secondary | ICD-10-CM | POA: Diagnosis not present

## 2017-03-08 DIAGNOSIS — E876 Hypokalemia: Secondary | ICD-10-CM | POA: Diagnosis not present

## 2017-03-08 DIAGNOSIS — E282 Polycystic ovarian syndrome: Secondary | ICD-10-CM | POA: Diagnosis not present

## 2017-03-08 DIAGNOSIS — N10 Acute tubulo-interstitial nephritis: Secondary | ICD-10-CM | POA: Diagnosis not present

## 2017-03-08 LAB — CBC
HCT: 34.6 % — ABNORMAL LOW (ref 36.0–46.0)
HEMOGLOBIN: 11.7 g/dL — AB (ref 12.0–15.0)
MCH: 27.8 pg (ref 26.0–34.0)
MCHC: 33.8 g/dL (ref 30.0–36.0)
MCV: 82.2 fL (ref 78.0–100.0)
PLATELETS: 345 10*3/uL (ref 150–400)
RBC: 4.21 MIL/uL (ref 3.87–5.11)
RDW: 14.7 % (ref 11.5–15.5)
WBC: 9.9 10*3/uL (ref 4.0–10.5)

## 2017-03-08 LAB — BASIC METABOLIC PANEL
ANION GAP: 12 (ref 5–15)
BUN: 7 mg/dL (ref 6–20)
CHLORIDE: 97 mmol/L — AB (ref 101–111)
CO2: 27 mmol/L (ref 22–32)
Calcium: 8.6 mg/dL — ABNORMAL LOW (ref 8.9–10.3)
Creatinine, Ser: 0.69 mg/dL (ref 0.44–1.00)
GFR calc non Af Amer: >60 mL/min (ref >60)
Glucose, Bld: 137 mg/dL — ABNORMAL HIGH (ref 65–99)
POTASSIUM: 3 mmol/L — AB (ref 3.5–5.1)
SODIUM: 136 mmol/L (ref 135–145)

## 2017-03-08 LAB — URINE CULTURE

## 2017-03-08 LAB — GLUCOSE, CAPILLARY: Glucose-Capillary: 123 mg/dL — ABNORMAL HIGH (ref 65–99)

## 2017-03-08 MED ORDER — POTASSIUM CHLORIDE CRYS ER 20 MEQ PO TBCR
40.0000 meq | EXTENDED_RELEASE_TABLET | Freq: Two times a day (BID) | ORAL | Status: DC
  Administered 2017-03-08: 40 meq via ORAL
  Filled 2017-03-08: qty 2

## 2017-03-08 MED ORDER — CEFPODOXIME PROXETIL 200 MG PO TABS: 200.0000 mg | ORAL_TABLET | Freq: Two times a day (BID) | ORAL | 0 refills | Status: DC

## 2017-03-08 MED ORDER — KETOROLAC TROMETHAMINE 10 MG PO TABS
10.0000 mg | ORAL_TABLET | Freq: Four times a day (QID) | ORAL | 0 refills | Status: DC | PRN
Start: 2017-03-08 — End: 2017-03-23

## 2017-03-08 NOTE — Discharge Summary (Signed)
Physician Discharge Summary  [name] PFX:[id number] DOB: October 02, 1976 DOA: 03/05/2017  PCP: Martinique, Betty G, MD  Admit date: 03/05/2017 Discharge date: 03/08/2017  Admitted From: Home Disposition:  Home  Recommendations for Outpatient Follow-up:  1. Follow up with PCP in 2-3 weeks 2. Follow up with Urology as needed  Discharge Condition:Improved CODE STATUS:Full Diet recommendation: Diabetic   Brief/Interim Summary: 41 y.o.femalewith a past medical history significant for PCOS/NIDDM, HTN, obesity, hx of nephrolithiasiswho presents with 4 days flank pain and dysuria.  The patient was in her usual state of health until Monday when she started to develop urinary urgency, having to "go all the time", dysuria at the end of urination, and left-sided flank pain. For the last 3 weeks, she had had a breast abscess for which she was initially treated with 7 days of Keflex which she completed around 12days ago followed by 7 days of Bactrim which she completed on Monday.  Over this week, the dysuria persisted, her pain was moderate but progressed to intense, came in "waves" and finally today was severe, more frequent or constant, and associated with nausea and chills so she came to the ER.   1. Acute ecoli pyelonephritis with nephrolithiasis: - Rocephin was started on admit -Lactic acid normal -CT reviewed. No signs of ureteral obstruction. No hydronephrosis -Urine culture pos for ecoli, resistant to ampicillin and bactrim --Patient to complete course of vantin on discharge  2. Type 2 diabetes and PCO S: - Euglycemic -Holding metformin -continued with SSI with meals  3. Hypokalemia/hypomagnesemia: -replaced Mg -Replaced potassium -stable  4. Hypertension: -Have continued amlodipine for now -Cozaar held at time of admission -Continued statin --remained stable  5. Depression: -Continue sertraline and trazodone -remained stable at present  Discharge Diagnoses:   Principal Problem:   Acute pyelonephritis Active Problems:   PCOS (polycystic ovarian syndrome)   Class 3 obesity with serious comorbidity and body mass index (BMI) of 50.0 to 59.9 in adult [facility])   Recurrent major depressive disorder, in full remission ([place])   Type 2 diabetes mellitus without complication, without long-term current use of insulin ([place])   Essential hypertension   Hypokalemia    Discharge Instructions   Allergies as of 03/08/2017   No Known Allergies     Medication List    STOP taking these medications   ibuprofen 200 MG tablet Commonly known as:  ADVIL,MOTRIN     TAKE these medications   amLODipine 5 MG tablet Commonly known as:  NORVASC TAKE 1 TABLET BY MOUTH  DAILY   cefpodoxime 200 MG tablet Commonly known as:  VANTIN Take 1 tablet (200 mg total) by mouth 2 (two) times daily.   HEATHER 0.35 MG tablet Generic drug:  norethindrone Take 1 tablet by mouth daily.   ICY HOT 5 % Ptch Generic drug:  Menthol Apply 1 patch topically as needed (pain).   ketorolac 10 MG tablet Commonly known as:  TORADOL Take 1 tablet (10 mg total) by mouth every 6 (six) hours as needed.   losartan 100 MG tablet Commonly known as:  COZAAR TAKE 1 TABLET BY MOUTH  DAILY   metFORMIN 1000 MG tablet Commonly known as:  GLUCOPHAGE Take 1 tablet (1,000 mg total) by mouth 2 (two) times daily with a meal.   rosuvastatin 10 MG tablet Commonly known as:  CRESTOR TAKE 1 TABLET BY MOUTH  DAILY   sertraline 100 MG tablet Commonly known as:  ZOLOFT Take 100 mg by mouth daily.   traZODone 150 MG  tablet Commonly known as:  DESYREL Take 150 mg by mouth at bedtime.      Follow-up Information    Martinique, Betty G, MD. Schedule an appointment as soon as possible for a visit in 2 week(s).   Specialty:  Family Medicine Contact information: [place] [address] [place]. Schedule an appointment as soon as  possible for a visit.   Contact information: [place] [phone]         No Known Allergies   Procedures/Studies: Ct Renal Stone Study  Result Date: 03/05/2017 CLINICAL DATA:  Left flank pain, urinary frequency, history of kidney stones EXAM: CT ABDOMEN AND PELVIS WITHOUT CONTRAST TECHNIQUE: Multidetector CT imaging of the abdomen and pelvis was performed following the standard protocol without IV contrast. COMPARISON:  10/01/2013 FINDINGS: Lower chest: Lung bases are essentially clear. Hepatobiliary: Moderate hepatic steatosis, with focal fatty sparing along the gallbladder fossa. Gallbladder is underdistended. No intrahepatic or extrahepatic ductal dilatation. Pancreas: Within normal limits. Spleen: Within normal limits. Adrenals/Urinary Tract: Adrenal glands are within normal limits. Right kidney is within normal limits. Two nonobstructing left lower pole renal calculi measuring 2-3 mm (series 2/ images 41 and 43). Mild nonspecific perinephric stranding along the left lower kidney (series 2/image 45). No ureteral or bladder calculi.  No hydronephrosis. Mild nonspecific stranding along the proximal left ureter (series 2/image 57). Bladder is mildly thick-walled although underdistended. Stomach/Bowel: Stomach is within normal limits. No evidence of bowel obstruction. Normal appendix (series 2/image 56). Sigmoid diverticulosis, without evidence of diverticulitis. Vascular/Lymphatic: No evidence of abdominal aortic aneurysm. No suspicious abdominopelvic lymphadenopathy. Reproductive: Uterus is within normal limits. Bilateral ovaries are within normal limits. Other: No abdominopelvic ascites. Musculoskeletal: Visualized osseous structures are within normal limits. IMPRESSION: Two nonobstructing left lower pole renal calculi measuring 2-3 mm. No ureteral or bladder calculi. No hydronephrosis. Mild nonspecific perinephric stranding along the left lower kidney.  Additional nonspecific stranding along the proximal left ureter. Differential considerations include recent passage of a ureteral calculus versus infection/pyelonephritis. Bladder is mildly thick-walled although underdistended. Correlate for cystitis. Electronically Signed   By: Julian Hy M.D.   On: 03/05/2017 16:32     Subjective: Eager to go home  Discharge Exam: Vitals:   03/07/17 2251 03/08/17 0622  BP: 120/68 (!) 113/56  Pulse: 80 79  Resp: 16   Temp: 98.7 F (37.1 C) 98.5 F (36.9 C)   Vitals:   03/07/17 0520 03/07/17 0956 03/07/17 2251 03/08/17 0622  BP: 134/81 132/82 120/68 (!) 113/56  Pulse: 90  80 79  Resp: 14  16   Temp: 98.1 F (36.7 C)  98.7 F (37.1 C) 98.5 F (36.9 C)  TempSrc: Oral  Oral Oral  SpO2: 94%  96% 92%  Weight:      Height:        General: Pt is alert, awake, not in acute distress Cardiovascular: RRR, S1/S2 +, no rubs, no gallops Respiratory: CTA bilaterally, no wheezing, no rhonchi Abdominal: Soft, NT, ND, bowel sounds + Extremities: no edema, no cyanosis   The results of significant diagnostics from this hospitalization (including imaging, microbiology, ancillary and laboratory) are listed below for reference.     Microbiology: Recent Results (from the past 240 hour(s))  Urine culture     Status: Abnormal   Collection Time: 03/05/17  3:02 PM  Result Value Ref Range Status   Specimen Description URINE, CLEAN CATCH  Final   Special Requests NONE  Final   Culture >=100,000 COLONIES/mL ESCHERICHIA COLI (A)  Final   Report Status 03/08/2017 FINAL  Final   Organism ID, Bacteria ESCHERICHIA COLI (A)  Final      Susceptibility   Escherichia coli - MIC*    AMPICILLIN >=32 RESISTANT Resistant     CEFAZOLIN <=4 SENSITIVE Sensitive     CEFTRIAXONE <=1 SENSITIVE Sensitive     CIPROFLOXACIN <=0.25 SENSITIVE Sensitive     GENTAMICIN <=1 SENSITIVE Sensitive     IMIPENEM <=0.25 SENSITIVE Sensitive     NITROFURANTOIN <=16 SENSITIVE  Sensitive     TRIMETH/SULFA >=320 RESISTANT Resistant     AMPICILLIN/SULBACTAM 16 INTERMEDIATE Intermediate     PIP/TAZO <=4 SENSITIVE Sensitive     Extended ESBL NEGATIVE Sensitive     * >=100,000 COLONIES/mL ESCHERICHIA COLI  Culture, blood (single)     Status: None (Preliminary result)   Collection Time: 03/05/17  8:40 PM  Result Value Ref Range Status   Specimen Description BLOOD RIGHT HAND  Final   Special Requests   Final    BOTTLES DRAWN AEROBIC AND ANAEROBIC Blood Culture adequate volume   Culture   Final    NO GROWTH 1 DAY Performed at [facility], [address], [place], [place] [address]    Report Status PENDING  Incomplete     Labs: BNP (last 3 results) No results for input(s): BNP in the last 8760 hours. Basic Metabolic Panel:  Recent Labs Lab 03/05/17 1609 03/06/17 0517 03/07/17 0409 03/08/17 0510  NA 135 138 137 136  K 3.1* 3.2* 3.5 3.0*  CL 101 104 101 97*  CO2 22 26 25 27   GLUCOSE 136* 125* 133* 137*  BUN 8 6 <5* 7  CREATININE 0.69 0.63 0.65 0.69  CALCIUM 9.4 7.7* 8.5* 8.6*  MG 1.7  --  2.2  --    Liver Function Tests:  Recent Labs Lab 03/05/17 1609  AST 25  ALT 19  ALKPHOS 62  BILITOT 0.3  PROT 7.5  ALBUMIN 3.9   No results for input(s): LIPASE, AMYLASE in the last 168 hours. No results for input(s): AMMONIA in the last 168 hours. CBC:  Recent Labs Lab 03/05/17 1609 03/06/17 0517 03/07/17 0409 03/08/17 0510  WBC 15.8* 17.7* 12.9* 9.9  NEUTROABS 11.8*  --   --   --   HGB 12.5 11.4* 11.9* 11.7*  HCT 36.6 34.2* 36.0 34.6*  MCV 81.2 83.0 82.9 82.2  PLT 392 332 339 345   Cardiac Enzymes: No results for input(s): CKTOTAL, CKMB, CKMBINDEX, TROPONINI in the last 168 hours. BNP: Invalid input(s): POCBNP CBG:  Recent Labs Lab 03/07/17 0802 03/07/17 1233 03/07/17 1649 03/07/17 2254 03/08/17 0733  GLUCAP 108* 117* 125* 99 123*   D-Dimer No results for input(s): DDIMER in the last 72 hours. Hgb A1c No results for  input(s): HGBA1C in the last 72 hours. Lipid Profile No results for input(s): CHOL, HDL, LDLCALC, TRIG, CHOLHDL, LDLDIRECT in the last 72 hours. Thyroid function studies No results for input(s): TSH, T4TOTAL, T3FREE, THYROIDAB in the last 72 hours.  Invalid input(s): FREET3 Anemia work up No results for input(s): VITAMINB12, FOLATE, FERRITIN, TIBC, IRON, RETICCTPCT in the last 72 hours. Urinalysis    Component Value Date/Time   COLORURINE AMBER (A) 03/05/2017 1502   APPEARANCEUR CLOUDY (A) 03/05/2017 1502   LABSPEC 1.015 03/05/2017 1502   PHURINE 5.0 03/05/2017 1502   GLUCOSEU NEGATIVE 03/05/2017 1502   HGBUR MODERATE (A)  03/05/2017 1502   [place] 03/05/2017 1502   KETONESUR NEGATIVE 03/05/2017 1502   PROTEINUR 100 (A) 03/05/2017 1502   UROBILINOGEN 0.2 10/01/2013 1639   NITRITE POSITIVE (A) 03/05/2017 1502   LEUKOCYTESUR LARGE (A) 03/05/2017 1502   Sepsis Labs Invalid input(s): PROCALCITONIN,  WBC,  LACTICIDVEN Microbiology Recent Results (from the past 240 hour(s))  Urine culture     Status: Abnormal   Collection Time: 03/05/17  3:02 PM  Result Value Ref Range Status   Specimen Description URINE, CLEAN CATCH  Final   Special Requests NONE  Final   Culture >=100,000 COLONIES/mL ESCHERICHIA COLI (A)  Final   Report Status 03/08/2017 FINAL  Final   Organism ID, Bacteria ESCHERICHIA COLI (A)  Final      Susceptibility   Escherichia coli - MIC*    AMPICILLIN >=32 RESISTANT Resistant     CEFAZOLIN <=4 SENSITIVE Sensitive     CEFTRIAXONE <=1 SENSITIVE Sensitive     CIPROFLOXACIN <=0.25 SENSITIVE Sensitive     GENTAMICIN <=1 SENSITIVE Sensitive     IMIPENEM <=0.25 SENSITIVE Sensitive     NITROFURANTOIN <=16 SENSITIVE Sensitive     TRIMETH/SULFA >=320 RESISTANT Resistant     AMPICILLIN/SULBACTAM 16 INTERMEDIATE Intermediate     PIP/TAZO <=4 SENSITIVE Sensitive     Extended ESBL NEGATIVE Sensitive     * >=100,000 COLONIES/mL ESCHERICHIA COLI  Culture, blood  (single)     Status: None (Preliminary result)   Collection Time: 03/05/17  8:40 PM  Result Value Ref Range Status   Specimen Description BLOOD RIGHT HAND  Final   Special Requests   Final    BOTTLES DRAWN AEROBIC AND ANAEROBIC Blood Culture adequate volume   Culture   Final    NO GROWTH 1 DAY Performed at [facility], [address] [place],  [address]    Report Status PENDING  Incomplete     SIGNED:   Etienne Millward, Orpah Melter, MD  [facility] 03/08/2017, 9:35 AM  If 7PM-7AM, please contact night-coverage [email or web address] Password [email or web address]

## 2017-03-10 ENCOUNTER — Telehealth: Payer: Self-pay

## 2017-03-10 NOTE — Telephone Encounter (Signed)
[email or web address]

## 2017-03-11 LAB — CULTURE, BLOOD (SINGLE)
Culture: NO GROWTH
SPECIAL REQUESTS: ADEQUATE

## 2017-03-11 NOTE — Telephone Encounter (Signed)
[email or web address]

## 2017-03-22 NOTE — Progress Notes (Signed)
HPI:   [name] is a 41 y.o. female, who is here today to follow on recent hospitalization. [name] phone call 03/10/17.    She was admitted from 03/05/17 to 03/08/17. She presented to the ER c/o dysuria, chills, and nausea. Dx with acute pyelonephritis. Ucx grew E. Coli. She was discharged on Vantin 200 mg bid, completed treatment. Denies fever,chills,fatigue, abdominal pain, dysuria,increased urinary frequency, gross hematuria,or decreased urine output.  She is feeling back to her normal self. DM II, she is not checking BS's.  Lab Results  Component Value Date   HGBA1C 7.5 (H) 02/21/2017     HTN: Cozaar was held according to discharge summery, but she continued all her medications as before hospitalization.  She is still on Amlodipine 5 mg daily. BP at home: She is not checking BP's.   Lab Results  Component Value Date   CREATININE 0.69 03/08/2017   BUN 7 03/08/2017   NA 136 03/08/2017   K 3.0 (L) 03/08/2017   CL 97 (L) 03/08/2017   CO2 27 03/08/2017   Lab Results  Component Value Date   WBC 9.9 03/08/2017   HGB 11.7 (L) 03/08/2017   HCT 34.6 (L) 03/08/2017   MCV 82.2 03/08/2017   PLT 345 03/08/2017    Obesity: She is also concerned about obesity, she would like to consider bariatric surgery. She has not been consistent with a healthy diet, she has followed Weight Watchers in the past. She is "walking some" but has not exercised regularly.    Wt Readings from Last 3 Encounters:  03/23/17 268 lb (121.6 kg)  03/05/17 274 lb (124.3 kg)  02/21/17 273 lb 6 oz (124 kg)    Review of Systems  Constitutional: Negative for activity change, appetite change, fatigue and fever.  HENT: Negative for mouth sores, nosebleeds and sore throat.   Eyes: Negative for redness and visual disturbance.  Respiratory: Negative for shortness of breath and wheezing.   Cardiovascular: Negative for chest pain, palpitations and leg swelling.  Gastrointestinal: Negative  for abdominal pain, nausea and vomiting.       No changes in bowel habits.  Endocrine: Negative for polydipsia, polyphagia and polyuria.  Genitourinary: Negative for dysuria, frequency, hematuria and urgency.  Musculoskeletal: Negative for back pain and myalgias.  Neurological: Negative for syncope, weakness and headaches.  Hematological: Negative for adenopathy. Does not bruise/bleed easily.  Psychiatric/Behavioral: Negative for confusion. The patient is not nervous/anxious.       Current Outpatient Prescriptions on File Prior to Visit  Medication Sig Dispense Refill  . amLODipine (NORVASC) 5 MG tablet TAKE 1 TABLET BY MOUTH  DAILY 90 tablet 0  . losartan (COZAAR) 100 MG tablet TAKE 1 TABLET BY MOUTH  DAILY 90 tablet 1  . Menthol (ICY HOT) 5 % PTCH Apply 1 patch topically as needed (pain).    . metFORMIN (GLUCOPHAGE) 1000 MG tablet Take 1 tablet (1,000 mg total) by mouth 2 (two) times daily with a meal. 180 tablet 1  . norethindrone (HEATHER) 0.35 MG tablet Take 1 tablet by mouth daily.    . rosuvastatin (CRESTOR) 10 MG tablet TAKE 1 TABLET BY MOUTH  DAILY 90 tablet 0  . sertraline (ZOLOFT) 100 MG tablet Take 100 mg by mouth daily.     . traZODone (DESYREL) 150 MG tablet Take 150 mg by mouth at bedtime.       No current facility-administered medications on file prior to visit.      Past Medical  History:  Diagnosis Date  . Anxiety   . Carpal tunnel syndrome of left wrist 11/2015  . Chronic low back pain   . Cough 12/08/2015  . Depression   . History of kidney stones   . Hyperlipemia 12/02/2015  . Hypertension    states under control with med., has been on med. < 9 mos.  . Migraines   . Non-insulin dependent type 2 diabetes mellitus ([place])   . Polycystic ovarian syndrome   . Renal disorder   . Tension headache, chronic    No Known Allergies  Social History   Social History  . Marital status: Married    Spouse name: N/A  . Number of children: 1  . Years of education: N/A     Occupational History  . 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 Smoking status: Former Smoker    Packs/day: 0.00    Quit date: 09/07/1998  . Smokeless tobacco: Never Used  . Alcohol use Yes     Comment: occasionally  . Drug use: No  . Sexual activity: Yes    Birth control/ protection: Pill     Comment: microgestin   Other Topics Concern  . None   Social History Narrative   Works in Therapist, art   Married    Vitals:   03/23/17 1109  BP: 136/80  Pulse: 100  Resp: 12  O2 sat at RA 95% Body mass index is 52.34 kg/m.   Physical Exam  Nursing note and vitals reviewed. Constitutional: She is oriented to person, place, and time. She appears well-developed. No distress.  HENT:  Head: Normocephalic and atraumatic.  Mouth/Throat: Oropharynx is clear and moist and mucous membranes are normal.  Eyes: Conjunctivae and EOM are normal.  Cardiovascular: Normal rate and regular rhythm.   No murmur heard. Pulses:      Dorsalis pedis pulses are 2+ on the right side, and 2+ on the left side.  Respiratory: Effort normal and breath sounds normal. No respiratory distress.  GI: Soft. She exhibits no mass. There is no hepatomegaly. There is no tenderness. There is no CVA tenderness.  Musculoskeletal: She exhibits no edema or tenderness.  Lymphadenopathy:    She has no cervical adenopathy.  Neurological: She is alert and oriented to person, place, and time. She has normal strength. Coordination and gait normal.  Skin: Skin is warm. No erythema.  Psychiatric: She has a normal mood and affect.  Well groomed, good eye contact.    ASSESSMENT AND PLAN:   Jase was seen today for hospitalization follow-up.  Diagnoses and all orders for this visit:   Acute pyelonephritis  Resolved. No further recommendations at this time. Avoid holding urine for hours and good DM controlled. F/U as needed.  Hypokalemia  Further recommendations will be given according  to labresults.  -     Basic metabolic panel  Class 3 obesity with serious comorbidity and body mass index (BMI) of 50.0 to 59.9 in adult, unspecified obesity type ([place])  We discussed benefits of wt loss as well as adverse effects of obesity. Consistency with healthy diet and physical activity recommended. Weight Watchers is a good option if she is interested in trying again. Also daily brisk walking for 15-30 min as tolerated. We dicussed a few procedures available, ideally she should try dietary changes first. Referral to surgery placed.  -     Ambulatory referral to General Surgery  Essential hypertension  Adequately controlled. No changes in current management.  DASH/low salt diet to continue. Eye exam is current. F/U in 6 months, before if needed.  Other orders -     Blood Glucose Monitoring Suppl (ONE TOUCH ULTRA SYSTEM KIT) w/Device KIT; 1 kit by Does not apply route once. -     glucose blood test strip; Use to test blood sugar once daily. -     ONETOUCH DELICA LANCETS 89F MISC; Use to test blood sugar once daily.      Kalla Watson G. Martin[place], MD  [facility]. [place] office.

## 2017-03-23 ENCOUNTER — Encounter: Payer: Self-pay | Admitting: Family Medicine

## 2017-03-23 ENCOUNTER — Ambulatory Visit (INDEPENDENT_AMBULATORY_CARE_PROVIDER_SITE_OTHER): Payer: 59 | Admitting: Family Medicine

## 2017-03-23 VITALS — BP 136/80 | HR 100 | Resp 12 | Ht 60.0 in | Wt 268.0 lb

## 2017-03-23 DIAGNOSIS — I10 Essential (primary) hypertension: Secondary | ICD-10-CM

## 2017-03-23 DIAGNOSIS — Z6841 Body Mass Index (BMI) 40.0 and over, adult: Secondary | ICD-10-CM

## 2017-03-23 DIAGNOSIS — E876 Hypokalemia: Secondary | ICD-10-CM | POA: Diagnosis not present

## 2017-03-23 DIAGNOSIS — E669 Obesity, unspecified: Secondary | ICD-10-CM | POA: Diagnosis not present

## 2017-03-23 DIAGNOSIS — N10 Acute tubulo-interstitial nephritis: Secondary | ICD-10-CM | POA: Diagnosis not present

## 2017-03-23 DIAGNOSIS — IMO0001 Reserved for inherently not codable concepts without codable children: Secondary | ICD-10-CM

## 2017-03-23 LAB — BASIC METABOLIC PANEL
BUN: 7 mg/dL (ref 6–23)
CHLORIDE: 102 meq/L (ref 96–112)
CO2: 28 meq/L (ref 19–32)
CREATININE: 0.66 mg/dL (ref 0.40–1.20)
Calcium: 9.3 mg/dL (ref 8.4–10.5)
GFR: 104.93 mL/min (ref >60.00)
Glucose, Bld: 105 mg/dL — ABNORMAL HIGH (ref 70–99)
Potassium: 4.1 mEq/L (ref 3.5–5.1)
Sodium: 137 mEq/L (ref 135–145)

## 2017-03-23 MED ORDER — ONETOUCH ULTRA SYSTEM W/DEVICE KIT: 1.0000 | PACK | Freq: Once | 0 refills | Status: AC

## 2017-03-23 MED ORDER — ONETOUCH DELICA LANCETS 33G MISC: 2 refills | Status: AC

## 2017-03-23 MED ORDER — GLUCOSE BLOOD VI STRP: ORAL_STRIP | 2 refills | Status: AC

## 2017-03-23 NOTE — Patient Instructions (Signed)
A few things to remember from today's visit:   Hypokalemia - Plan: Basic metabolic panel   Please be sure medication list is accurate. If a new problem present, please set up appointment sooner than planned today.

## 2017-03-28 ENCOUNTER — Other Ambulatory Visit: Payer: Self-pay | Admitting: Family Medicine

## 2017-04-23 ENCOUNTER — Other Ambulatory Visit: Payer: Self-pay | Admitting: Family Medicine

## 2017-05-01 ENCOUNTER — Other Ambulatory Visit: Payer: Self-pay | Admitting: Family Medicine

## 2017-05-11 ENCOUNTER — Other Ambulatory Visit: Payer: Self-pay | Admitting: Family Medicine

## 2017-06-21 NOTE — Progress Notes (Signed)
HPI:   [name] is a 41 y.o. female, who is here today for 4 months follow up.   She was last seen on 03/23/17 for post hospital follow-up.  Diabetes Mellitus II:    Currently on Metformin 1000 mg bid.. Last eye exam: Appt today. Checking BS's : FG 80-120's. Hypoglycemia:Denies.  She is tolerating medications well. She denies abdominal pain, nausea, vomiting, polydipsia, polyuria, or polyphagia. No numbness, tingling, or burning.    Lab Results  Component Value Date   CREATININE 0.66 03/23/2017   BUN 7 03/23/2017   NA 137 03/23/2017   K 4.1 03/23/2017   CL 102 03/23/2017   CO2 28 03/23/2017    Lab Results  Component Value Date   HGBA1C 7.5 (H) 02/21/2017   Lab Results  Component Value Date   MICROALBUR 18.6 (H) 02/21/2017    Dietary changes sine her last OV: Decreased carbs intake. Exercise: "Not much."   Review of Systems  Constitutional: Negative for activity change, appetite change, fatigue, fever and unexpected weight change.  HENT: Negative for mouth sores, nosebleeds and trouble swallowing.   Eyes: Negative for redness and visual disturbance.  Respiratory: Negative for cough, shortness of breath and wheezing.   Cardiovascular: Negative for chest pain, palpitations and leg swelling.  Gastrointestinal: Negative for abdominal pain, nausea and vomiting.       Negative for changes in bowel habits.  Endocrine: Negative for polydipsia, polyphagia and polyuria.  Genitourinary: Negative for decreased urine volume, dysuria and hematuria.  Musculoskeletal: Negative for gait problem and myalgias.  Skin: Negative for rash and wound.  Neurological: Negative for syncope, weakness, numbness and headaches.     Current Outpatient Prescriptions on File Prior to Visit  Medication Sig Dispense Refill  . amLODipine (NORVASC) 5 MG tablet TAKE 1 TABLET BY MOUTH  DAILY 90 tablet 1  . glucose blood test strip Use to test blood sugar once daily. 100 each 2   . losartan (COZAAR) 100 MG tablet TAKE 1 TABLET BY MOUTH  DAILY 90 tablet 1  . metFORMIN (GLUCOPHAGE) 1000 MG tablet Take 1 tablet (1,000 mg total) by mouth 2 (two) times daily with a meal. 180 tablet 1  . norethindrone (HEATHER) 0.35 MG tablet Take 1 tablet by mouth daily.    Glory Rosebush DELICA LANCETS 03T MISC Use to test blood sugar once daily. 100 each 2  . rosuvastatin (CRESTOR) 10 MG tablet TAKE 1 TABLET BY MOUTH  DAILY 90 tablet 4  . sertraline (ZOLOFT) 100 MG tablet Take 100 mg by mouth daily.     . traZODone (DESYREL) 150 MG tablet Take 150 mg by mouth at bedtime.       No current facility-administered medications on file prior to visit.      Past Medical History:  Diagnosis Date  . Anxiety   . Carpal tunnel syndrome of left wrist 11/2015  . Chronic low back pain   . Cough 12/08/2015  . Depression   . History of kidney stones   . Hyperlipemia 12/02/2015  . Hypertension    states under control with med., has been on med. < 9 mos.  . Migraines   . Non-insulin dependent type 2 diabetes mellitus ([place])   . Polycystic ovarian syndrome   . Renal disorder   . Tension headache, chronic    No Known Allergies  Social History   Social History  . Marital status: Married    Spouse name: N/A  . Number of children:  1  . Years of education: N/A   Occupational History  . 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 Smoking status: Former Smoker    Packs/day: 0.00    Quit date: 09/07/1998  . Smokeless tobacco: Never Used  . Alcohol use Yes     Comment: occasionally  . Drug use: No  . Sexual activity: Yes    Birth control/ protection: Pill     Comment: microgestin   Other Topics Concern  . None   Social History Narrative   Works in Therapist, art   Married    Vitals:   06/22/17 1045 06/22/17 1235  BP: 126/80   Pulse: (!) 103 96  Resp: 12   SpO2: 94%    Body mass index is 50.48 kg/m.  Wt Readings from Last 3 Encounters:  06/22/17 258 lb 8 oz  (117.3 kg)  03/23/17 268 lb (121.6 kg)  03/05/17 274 lb (124.3 kg)    Physical Exam  Nursing note and vitals reviewed. Constitutional: She is oriented to person, place, and time. She appears well-developed. No distress.  HENT:  Head: Normocephalic and atraumatic.  Mouth/Throat: Oropharynx is clear and moist and mucous membranes are normal.  Eyes: Pupils are equal, round, and reactive to light. Conjunctivae are normal.  Cardiovascular: Normal rate and regular rhythm.   No murmur heard. Pulses:      Dorsalis pedis pulses are 2+ on the right side, and 2+ on the left side.  Respiratory: Effort normal and breath sounds normal. No respiratory distress.  GI: Soft. She exhibits no mass. There is no hepatomegaly. There is no tenderness.  Musculoskeletal: She exhibits no edema.  Lymphadenopathy:    She has no cervical adenopathy.  Neurological: She is alert and oriented to person, place, and time. She has normal strength. Coordination and gait normal.  Skin: Skin is warm. No erythema.  Psychiatric: She has a normal mood and affect.  Well groomed, good eye contact.   Diabetic Foot Exam - Simple   Simple Foot Form Diabetic Foot exam was performed with the following findings:  Yes 02/21/2017 11:32 PM  Visual Inspection Sensation Testing Pulse Check Comments      ASSESSMENT AND PLAN:   [name] was seen today for 6 months follow-up.  Diagnoses and all orders for this visit: Lab Results  Component Value Date   HGBA1C 6.4 06/22/2017    Type 2 diabetes mellitus without complication, without long-term current use of insulin (HCC)  HgA1C at goal. No changes in current management. Regular exercise and healthy diet with avoidance of added sugar food intake is an important part of treatment and recommended. Annual eye exam, periodic dental and foot care recommended. F/U in 5-6 months  -     POCT glycosylated hemoglobin (Hb A1C)  Class 3 obesity with serious comorbidity  and body mass index (BMI) of 50.0 to 59.9 in adult, unspecified obesity type ([place])  She has lost about 10 Lb since her last OV. We discussed benefits of wt loss as well as adverse effects of obesity. Consistency with healthy diet and physical activity recommended. Daily brisk walking for 15-30 min as tolerated.    -Ms. AHMANI DAOUD was advised to return sooner than planned today if new concerns arise.       Cartier Washko G. Martin[place], MD  [facility]. [place] office.

## 2017-06-22 ENCOUNTER — Ambulatory Visit (INDEPENDENT_AMBULATORY_CARE_PROVIDER_SITE_OTHER): Payer: 59 | Admitting: Family Medicine

## 2017-06-22 ENCOUNTER — Encounter: Payer: Self-pay | Admitting: Family Medicine

## 2017-06-22 VITALS — BP 126/80 | HR 96 | Resp 12 | Ht 60.0 in | Wt 258.5 lb

## 2017-06-22 DIAGNOSIS — E119 Type 2 diabetes mellitus without complications: Secondary | ICD-10-CM

## 2017-06-22 DIAGNOSIS — IMO0001 Reserved for inherently not codable concepts without codable children: Secondary | ICD-10-CM

## 2017-06-22 DIAGNOSIS — Z6841 Body Mass Index (BMI) 40.0 and over, adult: Secondary | ICD-10-CM

## 2017-06-22 DIAGNOSIS — E669 Obesity, unspecified: Secondary | ICD-10-CM | POA: Diagnosis not present

## 2017-06-22 LAB — POCT GLYCOSYLATED HEMOGLOBIN (HGB A1C): Hemoglobin A1C: 6.4

## 2017-06-22 NOTE — Patient Instructions (Signed)
A few things to remember from today's visit:   Type 2 diabetes mellitus without complication, without long-term current use of insulin ([place]) - Plan: POCT glycosylated hemoglobin (Hb A1C)  Class 3 obesity with serious comorbidity and body mass index (BMI) of 50.0 to 59.9 in adult, unspecified obesity type ([name])  HgA1C goal < 7.0. Avoid sugar added food:regular soft drinks, energy drinks, and sports drinks. candy. cakes. cookies. pies and cobblers. sweet rolls, pastries, and donuts. fruit drinks, such as fruitades and fruit punch. dairy desserts, such as ice cream  Mediterranean diet has showed benefits for sugar control.  How much and what type of carbohydrate foods are important for managing diabetes. The balance between how much insulin is in your body and the carbohydrate you eat makes a difference in your blood glucose levels.  Fasting blood sugar ideally 130 or less, 2 hours after meals less than 180.   Regular exercise also will help with controlling disease, daily brisk walking as tolerated for 15-30 min definitively will help.   Avoid skipping meals, blood sugar might drop and cause serious problems. Remember checking feet periodically, good dental hygiene, and annual eye exam.     Diabetes Mellitus and Exercise Exercising regularly is important for your overall health, especially when you have diabetes (diabetes mellitus). Exercising is not only about losing weight. It has many health benefits, such as increasing muscle strength and bone density and reducing body fat and stress. This leads to improved fitness, flexibility, and endurance, all of which result in better overall health. Exercise has additional benefits for people with diabetes, including:  Reducing appetite.  Helping to lower and control blood glucose.  Lowering blood pressure.  Helping to control amounts of fatty substances (lipids) in the blood, such as cholesterol and triglycerides.  Helping the body to  respond better to insulin (improving insulin sensitivity).  Reducing how much insulin the body needs.  Decreasing the risk for heart disease by: ? Lowering cholesterol and triglyceride levels. ? Increasing the levels of good cholesterol. ? Lowering blood glucose levels.  What is my activity plan? Your health care provider or certified diabetes educator can help you make a plan for the type and frequency of exercise (activity plan) that works for you. Make sure that you:  Do at least 150 minutes of moderate-intensity or vigorous-intensity exercise each week. This could be brisk walking, biking, or water aerobics. ? Do stretching and strength exercises, such as yoga or weightlifting, at least 2 times a week. ? Spread out your activity over at least 3 days of the week.  Get some form of physical activity every day. ? Do not go more than 2 days in a row without some kind of physical activity. ? Avoid being inactive for more than 90 minutes at a time. Take frequent breaks to walk or stretch.  Choose a type of exercise or activity that you enjoy, and set realistic goals.  Start slowly, and gradually increase the intensity of your exercise over time.  What do I need to know about managing my diabetes?  Check your blood glucose before and after exercising. ? If your blood glucose is higher than 240 mg/dL (13.3 mmol/L) before you exercise, check your urine for ketones. If you have ketones in your urine, do not exercise until your blood glucose returns to normal.  Know the symptoms of low blood glucose (hypoglycemia) and how to treat it. Your risk for hypoglycemia increases during and after exercise. Common symptoms of hypoglycemia can include: ?  Hunger. ? Anxiety. ? Sweating and feeling clammy. ? Confusion. ? Dizziness or feeling light-headed. ? Increased heart rate or palpitations. ? Blurry vision. ? Tingling or numbness around the mouth, lips, or tongue. ? Tremors or  shakes. ? Irritability.  Keep a rapid-acting carbohydrate snack available before, during, and after exercise to help prevent or treat hypoglycemia.  Avoid injecting insulin into areas of the body that are going to be exercised. For example, avoid injecting insulin into: ? The arms, when playing tennis. ? The legs, when jogging.  Keep records of your exercise habits. Doing this can help you and your health care provider adjust your diabetes management plan as needed. Write down: ? Food that you eat before and after you exercise. ? Blood glucose levels before and after you exercise. ? The type and amount of exercise you have done. ? When your insulin is expected to peak, if you use insulin. Avoid exercising at times when your insulin is peaking.  When you start a new exercise or activity, work with your health care provider to make sure the activity is safe for you, and to adjust your insulin, medicines, or food intake as needed.  Drink plenty of water while you exercise to prevent dehydration or heat stroke. Drink enough fluid to keep your urine clear or pale yellow. This information is not intended to replace advice given to you by your health care provider. Make sure you discuss any questions you have with your health care provider. Document Released: 01/01/2004 Document Revised: 04/30/2016 Document Reviewed: 03/22/2016 Elsevier Interactive Patient Education  2018 [employer].  Please be sure medication list is accurate. If a new problem present, please set up appointment sooner than planned today.

## 2017-07-14 ENCOUNTER — Encounter: Payer: Self-pay | Admitting: Family Medicine

## 2017-08-23 ENCOUNTER — Ambulatory Visit: Payer: 59 | Admitting: Family Medicine

## 2017-09-10 ENCOUNTER — Other Ambulatory Visit: Payer: Self-pay | Admitting: Family Medicine

## 2017-09-22 ENCOUNTER — Ambulatory Visit (HOSPITAL_COMMUNITY): Payer: 59 | Admitting: Psychiatry

## 2017-10-07 ENCOUNTER — Other Ambulatory Visit: Payer: Self-pay | Admitting: Family Medicine

## 2017-10-15 ENCOUNTER — Other Ambulatory Visit: Payer: Self-pay | Admitting: Family Medicine

## 2017-12-14 ENCOUNTER — Ambulatory Visit (INDEPENDENT_AMBULATORY_CARE_PROVIDER_SITE_OTHER): Payer: 59 | Admitting: Psychiatry

## 2017-12-14 ENCOUNTER — Encounter (HOSPITAL_COMMUNITY): Payer: Self-pay | Admitting: Psychiatry

## 2017-12-14 VITALS — BP 117/57 | HR 83 | Ht 60.0 in | Wt 251.0 lb

## 2017-12-14 DIAGNOSIS — Z87891 Personal history of nicotine dependence: Secondary | ICD-10-CM

## 2017-12-14 DIAGNOSIS — I10 Essential (primary) hypertension: Secondary | ICD-10-CM | POA: Diagnosis not present

## 2017-12-14 DIAGNOSIS — F1290 Cannabis use, unspecified, uncomplicated: Secondary | ICD-10-CM

## 2017-12-14 DIAGNOSIS — E119 Type 2 diabetes mellitus without complications: Secondary | ICD-10-CM

## 2017-12-14 DIAGNOSIS — Z818 Family history of other mental and behavioral disorders: Secondary | ICD-10-CM

## 2017-12-14 DIAGNOSIS — E282 Polycystic ovarian syndrome: Secondary | ICD-10-CM

## 2017-12-14 DIAGNOSIS — F331 Major depressive disorder, recurrent, moderate: Secondary | ICD-10-CM | POA: Diagnosis not present

## 2017-12-14 DIAGNOSIS — Z79899 Other long term (current) drug therapy: Secondary | ICD-10-CM

## 2017-12-14 DIAGNOSIS — E785 Hyperlipidemia, unspecified: Secondary | ICD-10-CM

## 2017-12-14 DIAGNOSIS — R5383 Other fatigue: Secondary | ICD-10-CM

## 2017-12-14 MED ORDER — SERTRALINE HCL 100 MG PO TABS: 200.0000 mg | ORAL_TABLET | Freq: Every day | ORAL | 1 refills | Status: AC

## 2017-12-14 NOTE — Progress Notes (Signed)
Psychiatric Initial Adult Assessment   Patient Identification: [name] MRN:  [id number] Date of Evaluation:  12/14/2017 Referral Source: self referral Chief Complaint:  wants new psych MD Visit Diagnosis:    ICD-10-CM   1. Moderate episode of recurrent major depressive disorder (HCC) F33.1 CBC with Differential    Comprehensive metabolic panel    TSH + free T4    Vitamin D (25 hydroxy)    Vitamin D (25 hydroxy)    TSH + free T4    Comprehensive metabolic panel    CBC with Differential  2. Encounter for long-term (current) use of medications Z79.899 CBC with Differential    Comprehensive metabolic panel    TSH + free T4    Vitamin D (25 hydroxy)    Vitamin D (25 hydroxy)    TSH + free T4    Comprehensive metabolic panel    CBC with Differential  3. Other fatigue R53.83     History of Present Illness:  [name] is a 42 year old female with a history of major depressive disorder, PCOS, diabetes, hyperlipidemia, hypertension who presents today for psychiatric intake assessment.  She is currently managed by Dr. Clovis Pu at [facility] psychiatric.    She reports that her depression symptoms are fairly managed, but she feels like she is not fully happy in life quite yet.  She reports that she has an excellent family, support system, but feels somewhat in a rut emotionally.  She does not have panic attacks or anxiety, but struggles with being easily frustrated.  She struggles with having motivation to take care of herself in terms of exercise, and having positive hobbies.  She uses marijuana in the evenings to wind down from her long day of work and taking care of her daughter.  She reports that she realizes this is maladaptive.  I spent time with her discussing interventions that we can use to help her make more positive changes in her life, but I spent time also educating her on the limitations of medications, and the importance of individual therapy and behavioral changes.  We  agreed to increase Zoloft to 200 mg.  She does not feel that trazodone has provided any help, and she describes a pattern of circadian rhythm delay.  I suggested she use melatonin 3 mg nightly at sundown and she was receptive to this.  Okay to discontinue trazodone given lack of benefit.  She denies any acute safety issues or suicidality.  She does not drink on any regular basis.  I spent time with her discussing her weight and how this could affect her physical health and mental health.  Discussed weight loss strategies, and she describes some patterns of episodic binge eating particularly with carbohydrates and sweets.  Spent time exploring any symptoms of ADHD and she does not present with any overt inattentive symptoms, hyperactivity, procrastination or other impulsive behaviors.  She does struggle with impulsive irritability at times but this appears to be more related to dysphoria.  Associated Signs/Symptoms: Depression Symptoms:  depressed mood, anhedonia, insomnia, fatigue, anxiety, (Hypo) Manic Symptoms:  Irritable Mood, Anxiety Symptoms:  none Psychotic Symptoms:  none PTSD Symptoms: Negative  Past Psychiatric History: Outpatient psychiatric treatment at [facility], recently transferring care  Previous Psychotropic Medications: Yes   Substance Abuse History in the last 12 months:  Yes.    Consequences of Substance Abuse: Reports nightly use of marijuana for anxiety reduction  Past Medical History:  Past Medical History:  Diagnosis Date  . Anxiety   .  Carpal tunnel syndrome of left wrist 11/2015  . Chronic low back pain   . Cough 12/08/2015  . Depression   . History of kidney stones   . Hyperlipemia 12/02/2015  . Hypertension    states under control with med., has been on med. < 9 mos.  . Migraines   . Non-insulin dependent type 2 diabetes mellitus ([place])   . Polycystic ovarian syndrome   . Renal disorder   . Tension headache, chronic     Past Surgical History:   Procedure Laterality Date  . CARPAL TUNNEL RELEASE Right 09/16/2015   Procedure: RIGHT CARPAL TUNNEL RELEASE;  Surgeon: Leanora Cover, MD;  Location: [facility];  Service: Orthopedics;  Laterality: Right;  . CARPAL TUNNEL RELEASE Left 12/12/2015   Procedure: LEFT CARPAL TUNNEL RELEASE;  Surgeon: Leanora Cover, MD;  Location: [facility];  Service: Orthopedics;  Laterality: Left;  . CESAREAN SECTION  05/06/2007  . EXTRACORPOREAL SHOCK WAVE LITHOTRIPSY  11/22/2011  . LEEP N/A 03/17/2015   Procedure: LOOP ELECTROSURGICAL EXCISION PROCEDURE (LEEP) and Cold Knife Conization;  Surgeon: Eldred Manges, MD;  Location: [facility];  Service: Gynecology;  Laterality: N/A;  . WISDOM TOOTH EXTRACTION      Family Psychiatric History: Family history of depression  Family History:  Family History  Problem Relation Age of Onset  . Arthritis Mother   . Breast cancer Paternal Aunt   . Mental illness Father   . Melanoma Father     Social History:   Social History   Socioeconomic History  . Marital status: Married    Spouse name: None  . Number of children: 1  . Years of education: None  . Highest education level: None  Social Needs  . Financial resource strain: None  . Food insecurity - worry: None  . Food insecurity - inability: None  . Transportation needs - medical: None  . Transportation needs - non-medical: None  Occupational History  . Occupation: Data entry    Employer: [employer]  Tobacco Use  . Smoking status: Former Smoker    Packs/day: 0.00    Last attempt to quit: 09/07/1998    Years since quitting: 19.2  . Smokeless tobacco: Never Used  Substance and Sexual Activity  . Alcohol use: Yes    Comment: occasionally  . Drug use: No  . Sexual activity: Yes    Birth control/protection: Pill    Comment: microgestin  Other Topics Concern  . None  Social History Narrative   Works in Therapist, art   Married    Additional Social History: Works  for [employer], works from home, married for nearly 21 years, has a 45 year old daughter  Allergies:  No Known Allergies  Metabolic Disorder Labs: Lab Results  Component Value Date   HGBA1C 6.4 06/22/2017   MPG 114 11/08/2012   No results found for: PROLACTIN Lab Results  Component Value Date   CHOL 130 02/21/2017   TRIG 257.0 (H) 02/21/2017   HDL 42.20 02/21/2017   CHOLHDL 3 02/21/2017   VLDL 51.4 (H) 02/21/2017     Current Medications: Current Outpatient Medications  Medication Sig Dispense Refill  . amLODipine (NORVASC) 5 MG tablet TAKE 1 TABLET BY MOUTH  DAILY 90 tablet 1  . glucose blood test strip Use to test blood sugar once daily. 100 each 2  . losartan (COZAAR) 100 MG tablet TAKE 1 TABLET BY MOUTH  DAILY 90 tablet 1  . metFORMIN (GLUCOPHAGE) 1000 MG tablet Take 1  tablet (1,000 mg total) by mouth 2 (two) times daily with a meal. 180 tablet 1  . metFORMIN (GLUCOPHAGE) 500 MG tablet TAKE 1 TABLET BY MOUTH TWO  TIMES DAILY WITH MEALS 180 tablet 2  . norethindrone (HEATHER) 0.35 MG tablet Take 1 tablet by mouth daily.    [name] DELICA LANCETS 40J MISC Use to test blood sugar once daily. 100 each 2  . rosuvastatin (CRESTOR) 10 MG tablet TAKE 1 TABLET BY MOUTH  DAILY 90 tablet 4  . sertraline (ZOLOFT) 100 MG tablet Take 2 tablets (200 mg total) by mouth daily. 180 tablet 1  . traZODone (DESYREL) 150 MG tablet Take 150 mg by mouth at bedtime.       No current facility-administered medications for this visit.     Neurologic: Headache: Negative Seizure: Negative Paresthesias:Negative  Musculoskeletal: Strength & Muscle Tone: within normal limits Gait & Station: normal Patient leans: N/A  Psychiatric Specialty Exam: Review of Systems  Constitutional: Positive for malaise/fatigue.  HENT: Negative.   Respiratory: Negative.   Cardiovascular: Negative.   Gastrointestinal: Negative.   Musculoskeletal: Negative.   Neurological: Negative.    Psychiatric/Behavioral: Positive for depression and substance abuse. The patient has insomnia.     Blood pressure (!) 117/57, pulse 83, height 5' (1.524 m), weight 251 lb (113.9 kg).Body mass index is 49.02 kg/m.  General Appearance: Casual  Eye Contact:  Fair  Speech:  Normal Rate  Volume:  Normal  Mood:  Dysphoric  Affect:  Congruent  Thought Process:  Goal Directed and Descriptions of Associations: Intact  Orientation:  Full (Time, Place, and Person)  Thought Content:  Logical  Suicidal Thoughts:  No  Homicidal Thoughts:  No  Memory:  Immediate;   Fair  Judgement:  Fair  Insight:  Fair  Psychomotor Activity:  Normal  Concentration:  Concentration: Fair  Recall:  [employer] of Knowledge:Fair  Language: Fair  Akathisia:  Negative  Handed:  Right  AIMS (if indicated):  na  Assets:  Communication Skills Desire for Improvement Financial Resources/Insurance Housing Intimacy Social Support Transportation Vocational/Educational  ADL's:  Intact  Cognition: WNL  Sleep:  5-6 hours    Treatment Plan Summary: CLEMIE GENERAL is a 42 year old female with a history of major depressive disorder, PCOS, diabetes, hyperlipidemia, hypertension who presents today for psychiatric intake assessment.  She presents with some ongoing symptoms of depression and dysphoria.  I spent time educating her on the value of exercise, individual therapy, routine schedule with regard to sleep, healthy eating.  She would benefit from participating in individual therapy to continue to build skills in this area.    No acute safety issues in terms of SI, HI, AVH, but she does present with ongoing misuse of cannabis.  I spent time with her educating her on the deleterious effects of marijuana, and spent time considering other modalities of anxiety management.  I do believe she would benefit from melatonin nightly to address a generally misaligned circadian rhythm.  She is agreeable to proceed as below, and  follow-up with this writer in 8-10 weeks, in addition to initiating individual therapy.  1. Moderate episode of recurrent major depressive disorder ([place])   2. Encounter for long-term (current) use of medications   3. Other fatigue     Status of current problems: new  Labs Ordered: Orders Placed This Encounter  Procedures  . CBC with Differential    Standing Status:   Future    Number of Occurrences:   1  Standing Expiration Date:   12/14/2018  . Comprehensive metabolic panel    Standing Status:   Future    Number of Occurrences:   1    Standing Expiration Date:   12/14/2018  . TSH + free T4    Standing Status:   Future    Number of Occurrences:   1    Standing Expiration Date:   12/14/2018  . Vitamin D (25 hydroxy)    Standing Status:   Future    Number of Occurrences:   1    Standing Expiration Date:   12/14/2018    Labs Reviewed: Obtaining labs today  Collateral Obtained/Records Reviewed: N/A  Plan:  Increase Zoloft to 200 mg daily Trazodone use as needed, 150 mg nightly Melatonin 3 mg at sundown every evening Check CBC, CMP, TSH, free T4, vitamin D to investigate for additional etiologies of fatigue Consider Vyvanse for episodic binge eating and chronic fatigue Patient reports that she has had a sleep study negative for sleep apnea  I spent 50 minutes with the patient in direct face-to-face clinical care.  Greater than 50% of this time was spent in counseling and coordination of care with the patient.   Aundra Dubin, MD 2/20/201912:02 PM

## 2017-12-16 ENCOUNTER — Other Ambulatory Visit (HOSPITAL_COMMUNITY): Payer: Self-pay | Admitting: Psychiatry

## 2017-12-16 DIAGNOSIS — E559 Vitamin D deficiency, unspecified: Secondary | ICD-10-CM

## 2017-12-16 LAB — COMPREHENSIVE METABOLIC PANEL
A/G RATIO: 1.8 (ref 1.2–2.2)
ALBUMIN: 4.4 g/dL (ref 3.5–5.5)
ALT: 19 IU/L (ref 0–32)
AST: 29 IU/L (ref 0–40)
Alkaline Phosphatase: 87 IU/L (ref 39–117)
BUN / CREAT RATIO: 9 (ref 9–23)
BUN: 6 mg/dL (ref 6–24)
Bilirubin Total: 0.3 mg/dL (ref 0.0–1.2)
CALCIUM: 9.2 mg/dL (ref 8.7–10.2)
CHLORIDE: 101 mmol/L (ref 96–106)
CO2: 19 mmol/L — ABNORMAL LOW (ref 20–29)
Creatinine, Ser: 0.66 mg/dL (ref 0.57–1.00)
GFR, EST AFRICAN AMERICAN: 127 mL/min/{1.73_m2} (ref >59)
GFR, EST NON AFRICAN AMERICAN: 110 mL/min/{1.73_m2} (ref >59)
GLUCOSE: 101 mg/dL — AB (ref 65–99)
Globulin, Total: 2.5 g/dL (ref 1.5–4.5)
Potassium: 4.5 mmol/L (ref 3.5–5.2)
Sodium: 138 mmol/L (ref 134–144)
Total Protein: 6.9 g/dL (ref 6.0–8.5)

## 2017-12-16 LAB — CBC WITH DIFFERENTIAL/PLATELET
BASOS: 0 %
Basophils Absolute: 0 10*3/uL (ref 0.0–0.2)
EOS (ABSOLUTE): 0.3 10*3/uL (ref 0.0–0.4)
Eos: 3 %
HEMATOCRIT: 38.7 % (ref 34.0–46.6)
HEMOGLOBIN: 12.4 g/dL (ref 11.1–15.9)
IMMATURE GRANS (ABS): 0 10*3/uL (ref 0.0–0.1)
Immature Granulocytes: 0 %
LYMPHS ABS: 3.1 10*3/uL (ref 0.7–3.1)
Lymphs: 28 %
MCH: 27.2 pg (ref 26.6–33.0)
MCHC: 32 g/dL (ref 31.5–35.7)
MCV: 85 fL (ref 79–97)
MONOCYTES: 5 %
Monocytes Absolute: 0.5 10*3/uL (ref 0.1–0.9)
NEUTROS ABS: 7 10*3/uL (ref 1.4–7.0)
Neutrophils: 64 %
Platelets: 442 10*3/uL — ABNORMAL HIGH (ref 150–379)
RBC: 4.56 x10E6/uL (ref 3.77–5.28)
RDW: 15.5 % — ABNORMAL HIGH (ref 12.3–15.4)
WBC: 11 10*3/uL — ABNORMAL HIGH (ref 3.4–10.8)

## 2017-12-16 LAB — TSH+FREE T4
Free T4: 0.89 ng/dL (ref 0.82–1.77)
TSH: 3.36 u[IU]/mL (ref 0.450–4.500)

## 2017-12-16 LAB — VITAMIN D 25 HYDROXY (VIT D DEFICIENCY, FRACTURES): Vit D, 25-Hydroxy: 10.3 ng/mL — ABNORMAL LOW (ref 30.0–100.0)

## 2017-12-16 MED ORDER — VITAMIN D (ERGOCALCIFEROL) 1.25 MG (50000 UNIT) PO CAPS: 50000.0000 [IU] | ORAL_CAPSULE | ORAL | 0 refills | Status: AC

## 2017-12-16 NOTE — Progress Notes (Signed)
Called patient regarding low vitamin D, message left, ergocalciferol sent to pharmacy.

## 2017-12-21 ENCOUNTER — Ambulatory Visit: Payer: Self-pay

## 2017-12-21 ENCOUNTER — Encounter (HOSPITAL_COMMUNITY): Payer: Self-pay | Admitting: Psychiatry

## 2017-12-21 NOTE — Telephone Encounter (Signed)
Pt. Reports that last night around 8 p.m. She had an episode where she got hot and had elevated heart rate 120-130 that lasted 30 minutes. Had some nausea, no chest pain.Has an appointment for this Friday. States she is "transitioning off trazodone so maybe that had something to do with it." Instructed if she had this again with chest pain to call 911 - verbalizes understanding.  Answer Assessment - Initial Assessment Questions 1. DESCRIPTION: "Please describe your heart rate or heart beat that you are having" (e.g., fast/slow, regular/irregular, skipped or extra beats, "palpitations")     Had tachycardia last night that lasted 30 min. 2. ONSET: "When did it start?" (Minutes, hours or days)   Last night 3. DURATION: "How long does it last" (e.g., seconds, minutes, hours)     30 MIN 4. PATTERN "Does it come and go, or has it been constant since it started?"  "Does it get worse with exertion?"   "Are you feeling it now?"     Gone  5. TAP: "Using your hand, can you tap out what you are feeling on a chair or table in front of you, so that I can hear?" (Note: not all patients can do this)       Heart rate is fine now 6. HEART RATE: "Can you tell me your heart rate?" "How many beats in 15 seconds?"  (Note: not all patients can do this)      Normal 7. RECURRENT SYMPTOM: "Have you ever had this before?" If so, ask: "When was the last time?" and "What happened that time?"      No 8. CAUSE: "What do you think is causing the palpitations?"     States she is transitioning off trazodone, so "maybe that was it" 9. CARDIAC HISTORY: "Do you have any history of heart disease?" (e.g., heart attack, angina, bypass surgery, angioplasty, arrhythmia)      No 10. OTHER SYMPTOMS: "Do you have any other symptoms?" (e.g., dizziness, chest pain, sweating, difficulty breathing)       Had some nausea during this episode. 11. PREGNANCY: "Is there any chance you are pregnant?" "When was your last menstrual period?"      No  Protocols used: HEART RATE AND HEARTBEAT QUESTIONS-A-AH

## 2017-12-22 ENCOUNTER — Other Ambulatory Visit: Payer: Self-pay

## 2017-12-22 ENCOUNTER — Encounter (HOSPITAL_COMMUNITY): Payer: Self-pay | Admitting: Emergency Medicine

## 2017-12-22 ENCOUNTER — Emergency Department (HOSPITAL_COMMUNITY): Payer: 59

## 2017-12-22 DIAGNOSIS — Z5321 Procedure and treatment not carried out due to patient leaving prior to being seen by health care provider: Secondary | ICD-10-CM | POA: Insufficient documentation

## 2017-12-22 DIAGNOSIS — R079 Chest pain, unspecified: Secondary | ICD-10-CM | POA: Insufficient documentation

## 2017-12-22 LAB — BASIC METABOLIC PANEL
ANION GAP: 13 (ref 5–15)
BUN: 18 mg/dL (ref 6–20)
CO2: 23 mmol/L (ref 22–32)
Calcium: 10.2 mg/dL (ref 8.9–10.3)
Chloride: 102 mmol/L (ref 101–111)
Creatinine, Ser: 0.8 mg/dL (ref 0.44–1.00)
GFR calc Af Amer: >60 mL/min (ref >60)
GLUCOSE: 133 mg/dL — AB (ref 65–99)
POTASSIUM: 3.7 mmol/L (ref 3.5–5.1)
Sodium: 138 mmol/L (ref 135–145)

## 2017-12-22 LAB — CBC
HEMATOCRIT: 42.3 % (ref 36.0–46.0)
HEMOGLOBIN: 14 g/dL (ref 12.0–15.0)
MCH: 27.3 pg (ref 26.0–34.0)
MCHC: 33.1 g/dL (ref 30.0–36.0)
MCV: 82.6 fL (ref 78.0–100.0)
Platelets: 460 10*3/uL — ABNORMAL HIGH (ref 150–400)
RBC: 5.12 MIL/uL — ABNORMAL HIGH (ref 3.87–5.11)
RDW: 15.3 % (ref 11.5–15.5)
WBC: 15.7 10*3/uL — ABNORMAL HIGH (ref 4.0–10.5)

## 2017-12-22 LAB — I-STAT TROPONIN, ED: Troponin i, poc: 0.01 ng/mL (ref 0.00–0.08)

## 2017-12-22 LAB — I-STAT BETA HCG BLOOD, ED (MC, WL, AP ONLY)

## 2017-12-22 NOTE — ED Triage Notes (Signed)
Pt complaint of central chest discomfort with associated dizziness, nausea over past few days. Recently had zoloft dose increased and thought that was the cause but concerned related to continued symptoms.

## 2017-12-22 NOTE — Progress Notes (Signed)
HPI:   [name] is a 42 y.o. female, who is here today for 6 months follow up.   She was last seen on 06/22/17.    Diabetes Mellitus II:   Dx around 2016-2017. Currently on Metformin 1000 mg bid.  Checking BS's : FG 110-120. Hypoglycemia: Denies  She is tolerating medications well. She denies abdominal pain, vomiting, polydipsia, polyuria, or polyphagia. No numbness, tingling, or burning.   Lab Results  Component Value Date   HGBA1C 6.4 06/22/2017   Lab Results  Component Value Date   MICROALBUR 18.6 (H) 02/21/2017     Hypertension:  Since 2016.  Currently on Cozaar 100 mg daily.   She is on birth control.  She is taking medications as instructed, no side effects reported.  She has not noted unusual headache, visual changes, exertional chest pain, dyspnea,  focal weakness, or edema.   Lab Results  Component Value Date   CREATININE 0.80 12/22/2017   BUN 18 12/22/2017   NA 138 12/22/2017   K 3.7 12/22/2017   CL 102 12/22/2017   CO2 23 12/22/2017    HLD:  She is on Crestor 10 mg daily. She is trying to follow low fat diet.   Lab Results  Component Value Date   CHOL 130 02/21/2017   HDL 42.20 02/21/2017   LDLDIRECT 50.0 02/21/2017   TRIG 257.0 (H) 02/21/2017   CHOLHDL 3 02/21/2017    She was in the ER yesterday because a "bad reaction." She presented with c/o chest discomfort,dizziness, and nausea. Also having intermittent hot flashes and feeling her "heart racing." She left ER before she was seen.  She called her psychiatrists and Zoloft was decreased back to 50 mg and Trazodone continue. She is feeling better now. She started symptoms after increasing Zoloft from 50 mg to 100 mg. She was recommended discontinuing Trazodone but she continue it.   EKG, CXR,and blood work done.  Lab Results  Component Value Date   TSH 3.360 12/14/2017   Lab Results  Component Value Date   WBC 15.7 (H) 12/22/2017   HGB 14.0 12/22/2017     HCT 42.3 12/22/2017   MCV 82.6 12/22/2017   PLT 460 (H) 12/22/2017    She has had intermittent leukocytosis. She denies fever,abnormal wt loss, or night sweats. She has had problem for about 10-11 years. She was following with hematologists and negative work up.    Review of Systems  Constitutional: Negative for activity change, appetite change, fatigue and fever.  HENT: Negative for mouth sores, nosebleeds, sore throat and trouble swallowing.   Eyes: Negative for redness and visual disturbance.  Respiratory: Negative for cough, shortness of breath and wheezing.   Cardiovascular: Negative for chest pain and leg swelling.  Gastrointestinal: Negative for abdominal pain, diarrhea, nausea and vomiting.       Negative for changes in bowel habits.  Endocrine: Negative for cold intolerance, polydipsia, polyphagia and polyuria.  Genitourinary: Negative for decreased urine volume, dysuria and hematuria.  Musculoskeletal: Negative for gait problem and myalgias.  Skin: Negative for rash.  Neurological: Negative for syncope, weakness, numbness and headaches.  Hematological: Negative for adenopathy. Does not bruise/bleed easily.  Psychiatric/Behavioral: Negative for confusion. The patient is nervous/anxious.     Current Outpatient Medications on File Prior to Visit  Medication Sig Dispense Refill  . amLODipine (NORVASC) 5 MG tablet TAKE 1 TABLET BY MOUTH  DAILY 90 tablet 1  . glucose blood test strip Use to test blood  sugar once daily. 100 each 2  . losartan (COZAAR) 100 MG tablet TAKE 1 TABLET BY MOUTH  DAILY 90 tablet 1  . Melatonin 3 MG TABS Take 3 mg by mouth.    . metFORMIN (GLUCOPHAGE) 1000 MG tablet Take 1 tablet (1,000 mg total) by mouth 2 (two) times daily with a meal. 180 tablet 1  . norethindrone (HEATHER) 0.35 MG tablet Take 1 tablet by mouth daily.    [name] DELICA LANCETS 78L MISC Use to test blood sugar once daily. 100 each 2  . rosuvastatin (CRESTOR) 10 MG tablet TAKE  1 TABLET BY MOUTH  DAILY 90 tablet 4  . sertraline (ZOLOFT) 100 MG tablet Take 2 tablets (200 mg total) by mouth daily. (Patient taking differently: Take 150 mg by mouth daily. ) 180 tablet 1  . traZODone (DESYREL) 150 MG tablet Take 150 mg by mouth as needed.     . Vitamin D, Ergocalciferol, (DRISDOL) 50000 units CAPS capsule Take 1 capsule (50,000 Units total) by mouth every 7 (seven) days for 8 doses. 8 capsule 0   No current facility-administered medications on file prior to visit.      Past Medical History:  Diagnosis Date  . Anxiety   . Carpal tunnel syndrome of left wrist 11/2015  . Chronic low back pain   . Cough 12/08/2015  . Depression   . History of kidney stones   . Hyperlipemia 12/02/2015  . Hypertension    states under control with med., has been on med. < 9 mos.  . Migraines   . Non-insulin dependent type 2 diabetes mellitus ([place])   . Polycystic ovarian syndrome   . Renal disorder   . Tension headache, chronic    No Known Allergies  Social History   Socioeconomic History  . Marital status: Married    Spouse name: None  . Number of children: 1  . Years of education: None  . Highest education level: None  Social Needs  . Financial resource strain: None  . Food insecurity - worry: None  . Food insecurity - inability: None  . Transportation needs - medical: None  . Transportation needs - non-medical: None  Occupational History  . Occupation: Data entry    Employer: [employer]  Tobacco Use  . Smoking status: Former Smoker    Packs/day: 0.00    Last attempt to quit: 09/07/1998    Years since quitting: 19.3  . Smokeless tobacco: Never Used  Substance and Sexual Activity  . Alcohol use: Yes    Comment: occasionally  . Drug use: No  . Sexual activity: Yes    Birth control/protection: Pill    Comment: microgestin  Other Topics Concern  . None  Social History Narrative   Works in Therapist, art   Married    Vitals:   12/23/17 1047  BP:  124/70  Pulse: (!) 108  Resp: 12  Temp: 98.5 F (36.9 C)  SpO2: 96%   Body mass index is 47.65 kg/m.   Physical Exam  Nursing note and vitals reviewed. Constitutional: She is oriented to person, place, and time. She appears well-developed. No distress.  HENT:  Head: Normocephalic and atraumatic.  Mouth/Throat: Oropharynx is clear and moist and mucous membranes are normal.  Eyes: Conjunctivae are normal. Pupils are equal, round, and reactive to light.  Cardiovascular: Normal rate and regular rhythm.  No murmur heard. Pulses:      Dorsalis pedis pulses are 2+ on the right side, and 2+ on the  left side.  Respiratory: Effort normal and breath sounds normal. No respiratory distress.  GI: Soft. She exhibits no mass. There is no hepatomegaly. There is no tenderness.  Musculoskeletal: She exhibits no edema or tenderness.  Lymphadenopathy:    She has no cervical adenopathy.  Neurological: She is alert and oriented to person, place, and time. She has normal strength. Gait normal.  Skin: Skin is warm. No erythema.  Psychiatric: Her mood appears anxious.  Well groomed, poor eye contact.    Foot exam 05/2017.   ASSESSMENT AND PLAN:   [name] was seen today for 6 months follow-up.  Orders Placed This Encounter  Procedures  . Microalbumin / creatinine urine ratio  . POCT glycosylated hemoglobin (Hb A1C)   1. Type 2 diabetes mellitus without complication, without long-term current use of insulin (HCC)  HgA1C at goal. No changes in current management. Regular exercise and healthy diet with avoidance of added sugar food intake is an important part of treatment and recommended. Annual eye exam, periodic dental and foot care recommended. F/U in 5-6 months  - POCT glycosylated hemoglobin (Hb A1C) - Microalbumin / creatinine urine ratio  2. Essential hypertension  Adequately controlled. No changes in current management. DASH diet recommended. Eye exam recommended  annually. F/U in 6 months, before if needed.  3. Mixed hyperlipidemia  She is not fasting today. Continue Crestor 10 mg. Will plan on fasting labs next OV.  4. Leukocytosis, unspecified type  Chronic. Prior hematology evaluation , attributed to stress and obesity. Instructed about warning signs. We will continue following.  5. Heart palpitations  Improved. Seems attributed to changes in her psych medications.  EKG done in the ER negative for ischemia, unspecific T wave abnormalities Instructed about warning signs.   She will continue following with her psychiatrists.    -[name] was advised to return sooner than planned today if new concerns arise.       Patricia Buelna G. Martinique, MD  [facility]. [place] office.

## 2017-12-23 ENCOUNTER — Emergency Department (HOSPITAL_COMMUNITY)
Admission: EM | Admit: 2017-12-23 | Discharge: 2017-12-23 | Disposition: A | Discharge status: Home / Self Care | Payer: 59 | Attending: Emergency Medicine | Admitting: Emergency Medicine

## 2017-12-23 ENCOUNTER — Ambulatory Visit (INDEPENDENT_AMBULATORY_CARE_PROVIDER_SITE_OTHER): Payer: 59 | Admitting: Family Medicine

## 2017-12-23 ENCOUNTER — Encounter: Payer: Self-pay | Admitting: Family Medicine

## 2017-12-23 VITALS — BP 124/70 | HR 108 | Temp 98.5°F | Resp 12 | Ht 60.0 in | Wt 244.0 lb

## 2017-12-23 DIAGNOSIS — E119 Type 2 diabetes mellitus without complications: Secondary | ICD-10-CM

## 2017-12-23 DIAGNOSIS — E782 Mixed hyperlipidemia: Secondary | ICD-10-CM | POA: Diagnosis not present

## 2017-12-23 DIAGNOSIS — R002 Palpitations: Secondary | ICD-10-CM | POA: Diagnosis not present

## 2017-12-23 DIAGNOSIS — I10 Essential (primary) hypertension: Secondary | ICD-10-CM | POA: Diagnosis not present

## 2017-12-23 DIAGNOSIS — D72829 Elevated white blood cell count, unspecified: Secondary | ICD-10-CM | POA: Diagnosis not present

## 2017-12-23 LAB — POCT GLYCOSYLATED HEMOGLOBIN (HGB A1C): Hemoglobin A1C: 6.3

## 2017-12-23 LAB — MICROALBUMIN / CREATININE URINE RATIO
CREATININE, U: 320.9 mg/dL
Microalb Creat Ratio: 2.8 mg/g (ref 0.0–30.0)
Microalb, Ur: 9 mg/dL — ABNORMAL HIGH (ref 0.0–1.9)

## 2017-12-23 NOTE — Patient Instructions (Addendum)
A few things to remember from today's visit:   Type 2 diabetes mellitus without complication, without long-term current use of insulin ([place]) - Plan: POCT glycosylated hemoglobin (Hb A1C), Microalbumin / creatinine urine ratio  Essential hypertension  Mixed hyperlipidemia  Leukocytosis, unspecified type  Heart palpitations Symptoms you had yesterday could have been related to Trazodone + Zoloft dose increased.   We will follow on blood cells next visit as well as cholesterol.   Please be sure medication list is accurate. If a new problem present, please set up appointment sooner than planned today.

## 2017-12-23 NOTE — ED Notes (Signed)
Pt not in waiting area to take to treatment room

## 2017-12-23 NOTE — ED Notes (Signed)
I called patient to go back to a room and no one responded

## 2017-12-25 ENCOUNTER — Encounter: Payer: Self-pay | Admitting: Family Medicine

## 2018-01-19 ENCOUNTER — Ambulatory Visit (INDEPENDENT_AMBULATORY_CARE_PROVIDER_SITE_OTHER): Payer: 59 | Admitting: Psychology

## 2018-01-19 ENCOUNTER — Encounter (HOSPITAL_COMMUNITY): Payer: Self-pay | Admitting: Psychology

## 2018-01-19 DIAGNOSIS — F331 Major depressive disorder, recurrent, moderate: Secondary | ICD-10-CM | POA: Diagnosis not present

## 2018-01-19 NOTE — Progress Notes (Signed)
Comprehensive Clinical Assessment (CCA) Note  01/19/2018 [name] [id number]  Visit Diagnosis:      ICD-10-CM   1. Moderate episode of recurrent major depressive disorder (HCC) F33.1       CCA Part One  Part One has been completed on paper by the patient.  (See scanned document in Chart Review)  CCA Part Two A  Intake/Chief Complaint:  CCA Intake With Chief Complaint CCA Part Two Date: 01/19/18 CCA Part Two Time: 8 Chief Complaint/Presenting Problem: Pt is referred by Dr. Daron Offer for counseling of MDD.  Pt has been tx for MDD for about 7 years.  Pt reports first dealt w/ depression in college- struggled w/ expectations for self and not doing what I planned- vet school as was difficult course load as biology major.  pt reports that she stills struggles w/ feeling that she has accomplished what she could.  pt reports that she also stresses about finances, and current political situation.  pt reports she also has a stressful relationship w/ her daughter at times doesn't feel respected by.  Patients Currently Reported Symptoms/Problems: Pt reports mood tends to feel blah every day.  pt also reports irritable and at times will rage- yelling and screaming about the littlest thing.  pt reports this happens about biweekly.  pt reports sleep has imrpoved w/ melatonin and 1/2 trazodone.  pt reports typically would struggle w/ staying asleep.  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  Pt reports she tends to think very negative and focus on the negative. pt reports always tired- low energy and motivation.  pt reports she struggles w/ difficulty concentrating and worthlessness.  pt also has general anxiety about fianances and political environment.  Collateral Involvement: Dr. Joycelyn Schmid note  Individual's Strengths: supports "husband, 2 of my friends sara and erica and parents to some degree"  "enjoys cross stiching, reading, traveling  when time'  "pretty organized, pretty good baker, pretty nice" Individual's Preferences: "I want to get to a better frame of mind"  "try not to focus on the negative".   Type of Services Patient Feels Are Needed: counseling, medication management  Mental Health Symptoms Depression:  Depression: Change in energy/activity, Difficulty Concentrating, Fatigue, Irritability, Sleep (too much or little), Worthlessness  Mania:  Mania: N/A  Anxiety:   Anxiety: Worrying, Tension, Sleep, Irritability, Fatigue, Difficulty concentrating  Psychosis:  Psychosis: N/A  Trauma:  Trauma: N/A  Obsessions:  Obsessions: N/A  Compulsions:  Compulsions: N/A  Inattention:  Inattention: N/A  Hyperactivity/Impulsivity:  Hyperactivity/Impulsivity: N/A  Oppositional/Defiant Behaviors:  Oppositional/Defiant Behaviors: N/A  Borderline Personality:  Emotional Irregularity: N/A  Other Mood/Personality Symptoms:      Mental Status Exam Appearance and self-care  Stature:  Stature: Average  Weight:  Weight: Overweight  Clothing:  Clothing: Neat/clean  Grooming:  Grooming: Normal  Cosmetic use:  Cosmetic Use: Age appropriate  Posture/gait:  Posture/Gait: Normal  Motor activity:  Motor Activity: Not Remarkable  Sensorium  Attention:  Attention: Normal  Concentration:  Concentration: Normal  Orientation:  Orientation: X5  Recall/memory:  Recall/Memory: Normal  Affect and Mood  Affect:  Affect: Blunted  Mood:     Relating  Eye contact:  Eye Contact: Normal  Facial expression:  Facial Expression: Constricted  Attitude toward examiner:  Attitude Toward Examiner: Cooperative  Thought and Language  Speech flow: Speech Flow: Normal  Thought content:  Thought Content: Appropriate to mood and circumstances  Preoccupation:     Hallucinations:     Organization:     Transport planner of Knowledge:  Fund of Knowledge: Average  Intelligence:  Intelligence: Average  Abstraction:  Abstraction: Normal  Judgement:   Judgement: Normal  Reality Testing:  Reality Testing: Adequate  Insight:  Insight: Good  Decision Making:  Decision Making: Normal  Social Functioning  Social Maturity:  Social Maturity: Responsible  Social Judgement:  Social Judgement: Normal  Stress  Stressors:  Stressors: Money, Family conflict(relationship w/ daughter)  Coping Ability:  Coping Ability: English as a second language teacher Deficits:     Supports:      Family and Psychosocial History: Family history Marital status: Married Number of Years Married: 22 What types of issues is patient dealing with in the relationship?: none Are you sexually active?: Yes Does patient have children?: Yes How many children?: 1 How is patient's relationship with their children?: 10y/o daughter- feels doesn't respect her- strained at times.   Childhood History:  Childhood History By whom was/is the patient raised?: Both parents Additional childhood history information: born and grown up in [place] area Patient's description of current relationship with people who raised him/her: positive- visits w/ weekly.  Pt reports mom's health is declining.  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 How long has patient been employed?: 3 years - enjoys but also monotonous Patient's job has been impacted by current illness: No What is the longest time patient has a held a job?: 10years w/ [employer] Has patient  ever been in the [employer]?: No Are There Guns or Other Weapons in [place]?: No  Education: Education Did Teacher, adult education From [employer]?: Yes Did Physicist, medical?: Yes What Type of College Degree Do you Have?: BA in English/Psychology Did [place]?: No Did You Have An Individualized Education Program (IIEP): No Did You Have Any Difficulty At [employer]?: No  Religion: Religion/Spirituality Are You A Religious Person?: No How Might This Affect Treatment?: no  Leisure/Recreation: Leisure / Recreation Leisure and Hobbies: enjoys cross Herbalist, reading, traveling and baking  Exercise/Diet: Exercise/Diet Do You Exercise?: No Have You Gained or Lost A Significant Amount of Weight in the Past Six Months?: No Do You Follow a Special Diet?: Yes Type of Diet: watch carbs for diabetes Do You Have Any Trouble Sleeping?: Yes Explanation of Sleeping Difficulties: hx of difficutly maintaining sleep, and getting full quality nights sleep.  has shown improvement recent.   CCA Part Two C  Alcohol/Drug Use: Alcohol / Drug Use History of alcohol / drug use?: No history of alcohol / drug abuse(pt reports use of marijuana over past 2 years occassionally- does feel that helps to settle and sleeps better when uses.)                      CCA Part Three  ASAM's:  Six Dimensions of Multidimensional Assessment  Dimension 1:  Acute Intoxication and/or Withdrawal Potential:     Dimension 2:  Biomedical Conditions and Complications:  Dimension 3:  Emotional, Behavioral, or Cognitive Conditions and Complications:     Dimension 4:  Readiness to Change:     Dimension 5:  Relapse, Continued use, or Continued Problem Potential:     Dimension 6:  Recovery/Living Environment:      Substance use Disorder (SUD)    Social Function:  Social Functioning Social Maturity: Responsible Social Judgement: Normal  Stress:  Stress Stressors: Money, Family conflict(relationship w/  daughter) Coping Ability: Overwhelmed Patient Takes Medications The Way The Doctor Instructed?: Yes Priority Risk: Low Acuity  Risk Assessment- Self-Harm Potential: Risk Assessment For Self-Harm Potential Thoughts of Self-Harm: No current thoughts Method: No plan  Risk Assessment -Dangerous to Others Potential: Risk Assessment For Dangerous to Others Potential Method: No Plan  DSM5 Diagnoses: Patient Active Problem List   Diagnosis Date Noted  . Acute pyelonephritis 03/05/2017  . Hypokalemia 03/05/2017  . Essential thrombocytosis ([place]) 06/11/2016  . Leukocytosis 06/04/2016  . Reactive thrombocytosis 06/04/2016  . Essential hypertension 03/29/2016  . FH: melanoma 03/29/2016  . Recurrent major depressive disorder, in full remission ([place]) 12/02/2015  . Hyperlipemia 12/02/2015  . Type 2 diabetes mellitus without complication, without long-term current use of insulin ([place]) 12/02/2015  . HGSIL (high grade squamous intraepithelial dysplasia) 03/16/2015  . PCOS (polycystic ovarian syndrome) 11/08/2012  . Class 3 obesity with serious comorbidity and body mass index (BMI) of 50.0 to 59.9 in adult 11/08/2012    Patient Centered Plan: Patient is on the following Treatment Plan(s):  Depression  Recommendations for Services/Supports/Treatments: Recommendations for Services/Supports/Treatments Recommendations For Services/Supports/Treatments: Individual Therapy  Treatment Plan Summary: OP Treatment Plan Summary: pt to attend counseling on at least a monthly basis to assist coping w/ depression.  Continue medication management w/ Dr. Waunita Schooner

## 2018-02-02 ENCOUNTER — Encounter (HOSPITAL_COMMUNITY): Payer: Self-pay | Admitting: Psychiatry

## 2018-02-02 ENCOUNTER — Ambulatory Visit (INDEPENDENT_AMBULATORY_CARE_PROVIDER_SITE_OTHER): Payer: 59 | Admitting: Psychiatry

## 2018-02-02 VITALS — BP 109/74 | HR 77 | Ht 60.0 in | Wt 252.0 lb

## 2018-02-02 DIAGNOSIS — E559 Vitamin D deficiency, unspecified: Secondary | ICD-10-CM

## 2018-02-02 DIAGNOSIS — Z87891 Personal history of nicotine dependence: Secondary | ICD-10-CM | POA: Diagnosis not present

## 2018-02-02 DIAGNOSIS — F331 Major depressive disorder, recurrent, moderate: Secondary | ICD-10-CM

## 2018-02-02 DIAGNOSIS — Z818 Family history of other mental and behavioral disorders: Secondary | ICD-10-CM

## 2018-02-02 MED ORDER — TRAZODONE HCL 150 MG PO TABS: 150.0000 mg | ORAL_TABLET | ORAL | 0 refills | Status: DC | PRN

## 2018-02-02 NOTE — Progress Notes (Signed)
BH MD/PA/NP OP Progress Note  02/02/2018 12:34 PM [name]  MRN:  [id number]  Chief Complaint: doing well HPI: [name] reports her anxiety and depression seem to be at a good place and her sleep has been stable on the current regimen.  No acute safety issues or external stressors.  Spent time discussing any residual mood and anxiety symptoms and she does report that she has some irritability, but she would like to give it more time with the current regimen.  We agreed to obtain genesight testing to assess for her metabolism of antidepressants  Visit Diagnosis:    ICD-10-CM   1. Moderate episode of recurrent major depressive disorder (HCC) F33.1   2. Hypovitaminosis D E55.9     Past Psychiatric History: See intake H&P for full details. Reviewed, with no updates at this time.   Past Medical History:  Past Medical History:  Diagnosis Date  . Anxiety   . Carpal tunnel syndrome of left wrist 11/2015  . Chronic low back pain   . Cough 12/08/2015  . Depression   . History of kidney stones   . Hyperlipemia 12/02/2015  . Hypertension    states under control with med., has been on med. < 9 mos.  . Migraines   . Non-insulin dependent type 2 diabetes mellitus ([place])   . Polycystic ovarian syndrome   . Renal disorder   . Tension headache, chronic     Past Surgical History:  Procedure Laterality Date  . CARPAL TUNNEL RELEASE Right 09/16/2015   Procedure: RIGHT CARPAL TUNNEL RELEASE;  Surgeon: Leanora Cover, MD;  Location: [facility];  Service: Orthopedics;  Laterality: Right;  . CARPAL TUNNEL RELEASE Left 12/12/2015   Procedure: LEFT CARPAL TUNNEL RELEASE;  Surgeon: Leanora Cover, MD;  Location: [facility];  Service: Orthopedics;  Laterality: Left;  . CESAREAN SECTION  05/06/2007  . EXTRACORPOREAL SHOCK WAVE LITHOTRIPSY  11/22/2011  . LEEP N/A 03/17/2015   Procedure: LOOP ELECTROSURGICAL EXCISION PROCEDURE (LEEP) and Cold Knife [place];  Surgeon:  Eldred Manges, MD;  Location: [facility];  Service: Gynecology;  Laterality: N/A;  . WISDOM TOOTH EXTRACTION      Family Psychiatric History: See intake H&P for full details. Reviewed, with no updates at this time.   Family History:  Family History  Problem Relation Age of Onset  . Arthritis Mother   . Breast cancer Paternal Aunt   . Depression Paternal Aunt   . Mental illness Father   . Melanoma Father   . Bipolar disorder Father     Social History:  Social History   Socioeconomic History  . Marital status: Married    Spouse name: Not on file  . Number of children: 1  . Years of education: Not on file  . Highest education level: Not on file  Occupational History  . Occupation: Data entry    Employer: [employer]  . Financial resource strain: Not on file  . Food insecurity:    Worry: Not on file    Inability: Not on file  . Transportation needs:    Medical: Not on file    Non-medical: Not on file  Tobacco Use  . Smoking status: Former Smoker    Packs/day: 0.00    Last attempt to quit: 09/07/1998    Years since quitting: 19.4  . Smokeless tobacco: Never Used  Substance and Sexual Activity  . Alcohol use: Yes    Comment: occasionally  .  Drug use: No  . Sexual activity: Yes    Birth control/protection: Pill    Comment: microgestin  Lifestyle  . Physical activity:    Days per week: Not on file    Minutes per session: Not on file  . Stress: Not on file  Relationships  . Social connections:    Talks on phone: Not on file    Gets together: Not on file    Attends religious service: Not on file    Active member of club or organization: Not on file    Attends meetings of clubs or organizations: Not on file    Relationship status: Not on file  Other Topics Concern  . Not on file  Social History Narrative   Works in customer service   Married    Allergies: No Known Allergies  Metabolic Disorder Labs: Lab Results  Component Value Date    HGBA1C 6.3 12/23/2017   MPG 114 11/08/2012   No results found for: PROLACTIN Lab Results  Component Value Date   CHOL 130 02/21/2017   TRIG 257.0 (H) 02/21/2017   HDL 42.20 02/21/2017   CHOLHDL 3 02/21/2017   VLDL 51.4 (H) 02/21/2017   Lab Results  Component Value Date   TSH 3.360 12/14/2017    Therapeutic Level Labs: No results found for: LITHIUM No results found for: VALPROATE No components found for:  CBMZ  Current Medications: Current Outpatient Medications  Medication Sig Dispense Refill  . amLODipine (NORVASC) 5 MG tablet TAKE 1 TABLET BY MOUTH  DAILY 90 tablet 1  . glucose blood test strip Use to test blood sugar once daily. 100 each 2  . losartan (COZAAR) 100 MG tablet TAKE 1 TABLET BY MOUTH  DAILY 90 tablet 1  . Melatonin 3 MG TABS Take 3 mg by mouth.    . metFORMIN (GLUCOPHAGE) 1000 MG tablet Take 1 tablet (1,000 mg total) by mouth 2 (two) times daily with a meal. 180 tablet 1  . norethindrone (HEATHER) 0.35 MG tablet Take 1 tablet by mouth daily.    [name] Use to test blood sugar once daily. 100 each 2  . rosuvastatin (CRESTOR) 10 MG tablet TAKE 1 TABLET BY MOUTH  DAILY 90 tablet 4  . sertraline (ZOLOFT) 100 MG tablet Take 2 tablets (200 mg total) by mouth daily. (Patient taking differently: Take 150 mg by mouth daily. ) 180 tablet 1  . traZODone (DESYREL) 150 MG tablet Take 1 tablet (150 mg total) by mouth as needed. 90 tablet 0  . Vitamin D, Ergocalciferol, (DRISDOL) 50000 units CAPS capsule Take 1 capsule (50,000 Units total) by mouth every 7 (seven) days for 8 doses. 8 capsule 0   No current facility-administered medications for this visit.      Musculoskeletal: Strength & Muscle Tone: within normal limits Gait & Station: normal Patient leans: N/A  Psychiatric Specialty Exam: ROS  Blood pressure 109/74, pulse 77, height 5' (1.524 m), weight 252 lb (114.3 kg), SpO2 98 %.Body mass index is 49.22 kg/m.  General Appearance:  Casual and Well Groomed  Eye Contact:  Good  Speech:  Clear and Coherent and Normal Rate  Volume:  Normal  Mood:  Euthymic  Affect:  Appropriate and Congruent  Thought Process:  Goal Directed and Descriptions of Associations: Intact  Orientation:  Full (Time, Place, and Person)  Thought Content: Logical   Suicidal Thoughts:  No  Homicidal Thoughts:  No  Memory:  Immediate;   Good  Judgement:  Good  Insight:  Good  Psychomotor Activity:  Normal  Concentration:  Concentration: Good  Recall:  Good  Fund of Knowledge: Good  Language: Good  Akathisia:  Negative  Handed:  Right  AIMS (if indicated): not done  Assets:  Communication Skills Desire for Improvement Financial Resources/Insurance Housing Intimacy Leisure Time [place] Talents/Skills Transportation Vocational/Educational  ADL's:  Intact  Cognition: WNL  Sleep:  Good   Screenings: GAD-7     Counselor from 01/19/2018 in [facility]  Total GAD-7 Score  12    PHQ2-9     Counselor from 01/19/2018 in [facility] Nutrition from 09/15/2015 in [facility]  PHQ-2 Total Score  3  0  0  PHQ-9 Total Score  11  -  -       Assessment and Plan: [name] presents with overall improving mood.  She has had side effects from various antidepressants in the past, so we will investigate with genetic testing in case we need to switch her antidepressant in the future.  We will proceed as below and follow-up in 3-4 months.  1. Moderate episode of recurrent major depressive disorder ([place])   2. Hypovitaminosis D     Status of current problems: stable  Labs Ordered: No orders of the defined types were placed in this encounter.   Labs Reviewed: n/a  Collateral Obtained/Records Reviewed: n/a  Plan:  Continue Zoloft 150  mg daily; patient had tremulousness and nausea with 200 mg Trazodone 75 mg nightly  I spent 20 minutes with the patient in direct face-to-face clinical care.  Greater than 50% of this time was spent in counseling and coordination of care with the patient.    Aundra Dubin, MD 02/02/2018, 12:34 PM

## 2018-02-20 ENCOUNTER — Encounter: Payer: Self-pay | Admitting: Family Medicine

## 2018-02-20 ENCOUNTER — Ambulatory Visit (INDEPENDENT_AMBULATORY_CARE_PROVIDER_SITE_OTHER): Payer: Self-pay | Admitting: Family Medicine

## 2018-02-20 ENCOUNTER — Ambulatory Visit: Payer: Self-pay

## 2018-02-20 VITALS — BP 126/82 | HR 76 | Temp 98.6°F | Resp 12 | Ht 60.0 in | Wt 251.4 lb

## 2018-02-20 DIAGNOSIS — R002 Palpitations: Secondary | ICD-10-CM

## 2018-02-20 DIAGNOSIS — I10 Essential (primary) hypertension: Secondary | ICD-10-CM

## 2018-02-20 NOTE — Progress Notes (Signed)
No chief complaint on file.   HPI: [name] 42 y.o. come in foracute  Visit PCP is Dr [place]   For few seconds of palpitaitions and no assoc sx       Conversation: Palpitations  (Newest Message First)  Marcello Moores, RN  02/20/18 12:56 PM  Unsigned Note    Pt. Reports having "some palpitations since this past Friday." Lasts a couple of seconds. Denies chest pain or shortness of breath. States has "cut back on caffeine, especially in the evenings." Appointment made for today. Reason for Disposition . [1] Palpitations AND [2] no improvement after following Care Advice  Answer Assessment - Initial Assessment Questions 1. DESCRIPTION: "Please describe your heart rate or heart beat that you are having" (e.g., fast/slow, regular/irregular, skipped or extra beats, "palpitations")     Some skipping beats 2. ONSET: "When did it start?" (Minutes, hours or days)      Started Friday night 3. DURATION: "How long does it last" (e.g., seconds, minutes, hours)     A couple of seconds 4. PATTERN "Does it come and go, or has it been constant since it started?"  "Does it get worse with exertion?"   "Are you feeling it now?"     Comes and goes 5. TAP: "Using your hand, can you tap out what you are feeling on a chair or table in front of you, so that I can hear?" (Note: not all patients can do this)       100 - feels regular 6. HEART RATE: "Can you tell me your heart rate?" "How many beats in 15 seconds?"  (Note: not all patients can do this)       100 7. RECURRENT SYMPTOM: "Have you ever had this before?" If so, ask: "When was the last time?" and "What happened that time?"      Yes, but not often 8. CAUSE: "What do you think is causing the palpitations?"     Unsure 9. CARDIAC HISTORY: "Do you have any history of heart disease?" (e.g., heart attack, angina, bypass surgery, angioplasty, arrhythmia)      No 10. OTHER SYMPTOMS: "Do you have any other symptoms?" (e.g., dizziness, chest  pain, sweating, difficulty breathing)       A warmth in my chest 11. PREGNANCY: "Is there any chance you are pregnant?" "When was your last menstrual period?"       No  Protocols used: HEART RATE AND HEARTBEAT QUESTIONS-A-AH       ROS: See pertinent positives and negatives per HPI.  Past Medical History:  Diagnosis Date  . Anxiety   . Carpal tunnel syndrome of left wrist 11/2015  . Chronic low back pain   . Cough 12/08/2015  . Depression   . History of kidney stones   . Hyperlipemia 12/02/2015  . Hypertension    states under control with med., has been on med. < 9 mos.  . Migraines   . Non-insulin dependent type 2 diabetes mellitus ([place])   . Polycystic ovarian syndrome   . Renal disorder   . Tension headache, chronic     Family History  Problem Relation Age of Onset  . Arthritis Mother   . Breast cancer Paternal Aunt   . Depression Paternal Aunt   . Mental illness Father   . Melanoma Father   . Bipolar disorder Father     Social History   Socioeconomic History  . Marital status: Married    Spouse name: Not  on file  . Number of children: 1  . Years of education: Not on file  . Highest education level: Not on file  Occupational History  . Occupation: Data entry    Employer: [employer]  . Financial resource strain: Not on file  . Food insecurity:    Worry: Not on file    Inability: Not on file  . Transportation needs:    Medical: Not on file    Non-medical: Not on file  Tobacco Use  . Smoking status: Former Smoker    Packs/day: 0.00    Last attempt to quit: 09/07/1998    Years since quitting: 19.4  . Smokeless tobacco: Never Used  Substance and Sexual Activity  . Alcohol use: Yes    Comment: occasionally  . Drug use: No  . Sexual activity: Yes    Birth control/protection: Pill    Comment: microgestin  Lifestyle  . Physical activity:    Days per week: Not on file    Minutes per session: Not on file  . Stress: Not on file    Relationships  . Social connections:    Talks on phone: Not on file    Gets together: Not on file    Attends religious service: Not on file    Active member of club or organization: Not on file    Attends meetings of clubs or organizations: Not on file    Relationship status: Not on file  Other Topics Concern  . Not on file  Social History Narrative   Works in Therapist, art   Married    Outpatient Medications Prior to Visit  Medication Sig Dispense Refill  . amLODipine (NORVASC) 5 MG tablet TAKE 1 TABLET BY MOUTH  DAILY 90 tablet 1  . glucose blood test strip Use to test blood sugar once daily. 100 each 2  . losartan (COZAAR) 100 MG tablet TAKE 1 TABLET BY MOUTH  DAILY 90 tablet 1  . Melatonin 3 MG TABS Take 3 mg by mouth.    . metFORMIN (GLUCOPHAGE) 1000 MG tablet Take 1 tablet (1,000 mg total) by mouth 2 (two) times daily with a meal. 180 tablet 1  . norethindrone (HEATHER) 0.35 MG tablet Take 1 tablet by mouth daily.    Glory Rosebush DELICA LANCETS 65K MISC Use to test blood sugar once daily. 100 each 2  . rosuvastatin (CRESTOR) 10 MG tablet TAKE 1 TABLET BY MOUTH  DAILY 90 tablet 4  . sertraline (ZOLOFT) 100 MG tablet Take 2 tablets (200 mg total) by mouth daily. (Patient taking differently: Take 150 mg by mouth daily. ) 180 tablet 1  . traZODone (DESYREL) 150 MG tablet Take 1 tablet (150 mg total) by mouth as needed. 90 tablet 0   No facility-administered medications prior to visit.      EXAM:  There were no vitals taken for this visit.  There is no height or weight on file to calculate BMI.  GENERAL: vitals reviewed and listed above, alert, oriented, appears well hydrated and in no acute distress HEENT: atraumatic, conjunctiva  clear, no obvious abnormalities on inspection of external nose and ears OP : no lesion edema or exudate  NECK: no obvious masses on inspection palpation  LUNGS: clear to auscultation bilaterally, no wheezes, rales or rhonchi, good air  movement CV: HRRR, no clubbing cyanosis or  peripheral edema nl cap refill  MS: moves all extremities without noticeable focal  abnormality PSYCH: pleasant and cooperative, no obvious depression or  anxiety Lab Results  Component Value Date   WBC 15.7 (H) 12/22/2017   HGB 14.0 12/22/2017   HCT 42.3 12/22/2017   PLT 460 (H) 12/22/2017   GLUCOSE 133 (H) 12/22/2017   CHOL 130 02/21/2017   TRIG 257.0 (H) 02/21/2017   HDL 42.20 02/21/2017   LDLDIRECT 50.0 02/21/2017   ALT 19 12/14/2017   AST 29 12/14/2017   NA 138 12/22/2017   K 3.7 12/22/2017   CL 102 12/22/2017   CREATININE 0.80 12/22/2017   BUN 18 12/22/2017   CO2 23 12/22/2017   TSH 3.360 12/14/2017   HGBA1C 6.3 12/23/2017   MICROALBUR 9.0 (H) 12/23/2017   BP Readings from Last 3 Encounters:  12/23/17 124/70  12/22/17 (!) 142/81  06/22/17 126/80   Last ekg in march in ed  Borderline t wave flattenting  Sinus rhytym.  ASSESSMENT AND PLAN:  Discussed the following assessment and plan:  No diagnosis found.  -Patient advised to return or notify health care team  if  new concerns arise.  There are no Patient Instructions on file for this visit.   Standley Brooking. Giuliano Preece M.D.

## 2018-02-20 NOTE — Patient Instructions (Addendum)
A few things to remember from today's visit:   Heart palpitations - Plan: Basic metabolic panel, EKG 37-CWUG, TSH  Essential hypertension  Palpitations A palpitation is the feeling that your heart:  Has an uneven (irregular) heartbeat.  Is beating faster than normal.  Is fluttering.  Is skipping a beat.  This is usually not a serious problem. In some cases, you may need more medical tests. Follow these instructions at home:  Avoid: ? Caffeine in coffee, tea, soft drinks, diet pills, and energy drinks. ? Chocolate. ? Alcohol.  Do not use any tobacco products. These include cigarettes, chewing tobacco, and e-cigarettes. If you need help quitting, ask your doctor.  Try to reduce your stress. These things may help: ? Yoga. ? Meditation. ? Physical activity. Swimming, jogging, and walking are good choices. ? A method that helps you use your mind to control things in your body, like heartbeats (biofeedback).  Get plenty of rest and sleep.  Take over-the-counter and prescription medicines only as told by your doctor.  Keep all follow-up visits as told by your doctor. This is important. Contact a doctor if:  Your heartbeat is still fast or uneven after 24 hours.  Your palpitations occur more often. Get help right away if:  You have chest pain.  You feel short of breath.  You have a very bad headache.  You feel dizzy.  You pass out (faint). This information is not intended to replace advice given to you by your health care provider. Make sure you discuss any questions you have with your health care provider. Document Released: 07/20/2008 Document Revised: 03/18/2016 Document Reviewed: 06/26/2015 Elsevier Interactive Patient Education  2018 [employer].  Decreased/avoid caffeine intake. Adequate hydration. If problem is persistent we could change blood pressure medications for something that helps with palpitations. Cardiology referral coloscopy consider if  problem does not improve.  Please be sure medication list is accurate. If a new problem present, please set up appointment sooner than planned today.

## 2018-02-20 NOTE — Telephone Encounter (Signed)
Pt. Reports having "some palpitations since this past Friday." Lasts a couple of seconds. Denies chest pain or shortness of breath. States has "cut back on caffeine, especially in the evenings." Appointment made for today. Reason for Disposition . [1] Palpitations AND [2] no improvement after following Care Advice  Answer Assessment - Initial Assessment Questions 1. DESCRIPTION: "Please describe your heart rate or heart beat that you are having" (e.g., fast/slow, regular/irregular, skipped or extra beats, "palpitations")     Some skipping beats 2. ONSET: "When did it start?" (Minutes, hours or days)      Started Friday night 3. DURATION: "How long does it last" (e.g., seconds, minutes, hours)     A couple of seconds 4. PATTERN "Does it come and go, or has it been constant since it started?"  "Does it get worse with exertion?"   "Are you feeling it now?"     Comes and goes 5. TAP: "Using your hand, can you tap out what you are feeling on a chair or table in front of you, so that I can hear?" (Note: not all patients can do this)       100 - feels regular 6. HEART RATE: "Can you tell me your heart rate?" "How many beats in 15 seconds?"  (Note: not all patients can do this)       100 7. RECURRENT SYMPTOM: "Have you ever had this before?" If so, ask: "When was the last time?" and "What happened that time?"      Yes, but not often 8. CAUSE: "What do you think is causing the palpitations?"     Unsure 9. CARDIAC HISTORY: "Do you have any history of heart disease?" (e.g., heart attack, angina, bypass surgery, angioplasty, arrhythmia)      No 10. OTHER SYMPTOMS: "Do you have any other symptoms?" (e.g., dizziness, chest pain, sweating, difficulty breathing)       A warmth in my chest 11. PREGNANCY: "Is there any chance you are pregnant?" "When was your last menstrual period?"       No  Protocols used: HEART RATE AND HEARTBEAT QUESTIONS-A-AH

## 2018-02-20 NOTE — Progress Notes (Addendum)
ACUTE VISIT   HPI:  Chief Complaint  Patient presents with  . Palpitations    off and on at random times during the day since Thursday night    [name] is a 42 y.o. female, who is here today complaining of heart palpitations for the past 3 to 4 days. It is intermittent, initially it was worse at night but today she has had frequent episodes through the day. Associated "little" chest tightness and "warm feeling",episodes lasts a few seconds. No associated dyspnea,diaphoresis,or syncope. She has checked HR when symptomatic and HR between 80-90/min. She has not noticed irregular HR.  She has not identified exacerbating factors,it is random.  No changes in stress level.  She had a cold last week,so she took OTC cold medication but has not done so this week.   Lab Results  Component Value Date   TSH 3.360 12/14/2017   Hx of hypertension, currently she is on amlodipine 5 mg daily and losartan 100 mg daily. Negative for headache, visual changes, chest pain, focal weakness, or edema.     Lab Results  Component Value Date   CREATININE 0.80 12/22/2017   BUN 18 12/22/2017   NA 138 12/22/2017   K 3.7 12/22/2017   CL 102 12/22/2017   CO2 23 12/22/2017   DM II on Metformin. Hx of depression/anxiety,symptpms reported as stable.   Review of Systems  Constitutional: Negative for activity change, appetite change, fatigue and fever.  HENT: Negative for mouth sores, nosebleeds and trouble swallowing.   Eyes: Negative for redness and visual disturbance.  Respiratory: Positive for chest tightness. Negative for cough, shortness of breath and wheezing.   Cardiovascular: Positive for palpitations. Negative for chest pain and leg swelling.  Gastrointestinal: Negative for abdominal pain, nausea and vomiting.       Negative for changes in bowel habits.  Endocrine: Negative for cold intolerance and heat intolerance.  Genitourinary: Negative for decreased urine volume  and hematuria.  Skin: Negative for rash.  Neurological: Negative for syncope, weakness and headaches.  Psychiatric/Behavioral: Negative for confusion. The patient is nervous/anxious.       Current Outpatient Medications on File Prior to Visit  Medication Sig Dispense Refill  . amLODipine (NORVASC) 5 MG tablet TAKE 1 TABLET BY MOUTH  DAILY 90 tablet 1  . glucose blood test strip Use to test blood sugar once daily. 100 each 2  . losartan (COZAAR) 100 MG tablet TAKE 1 TABLET BY MOUTH  DAILY 90 tablet 1  . Melatonin 3 MG TABS Take 3 mg by mouth.    . metFORMIN (GLUCOPHAGE) 1000 MG tablet Take 1 tablet (1,000 mg total) by mouth 2 (two) times daily with a meal. 180 tablet 1  . norethindrone (HEATHER) 0.35 MG tablet Take 1 tablet by mouth daily.    Glory Rosebush DELICA LANCETS 02I MISC Use to test blood sugar once daily. 100 each 2  . rosuvastatin (CRESTOR) 10 MG tablet TAKE 1 TABLET BY MOUTH  DAILY 90 tablet 4  . sertraline (ZOLOFT) 100 MG tablet Take 2 tablets (200 mg total) by mouth daily. (Patient taking differently: Take 150 mg by mouth daily. ) 180 tablet 1  . traZODone (DESYREL) 150 MG tablet Take 1 tablet (150 mg total) by mouth as needed. 90 tablet 0   No current facility-administered medications on file prior to visit.      Past Medical History:  Diagnosis Date  . Anxiety   . Carpal tunnel syndrome of left  wrist 11/2015  . Chronic low back pain   . Cough 12/08/2015  . Depression   . History of kidney stones   . Hyperlipemia 12/02/2015  . Hypertension    states under control with med., has been on med. < 9 mos.  . Migraines   . Non-insulin dependent type 2 diabetes mellitus ([place])   . Polycystic ovarian syndrome   . Renal disorder   . Tension headache, chronic    No Known Allergies  Social History   Socioeconomic History  . Marital status: Married    Spouse name: Not on file  . Number of children: 1  . Years of education: Not on file  . Highest education level: Not on  file  Occupational History  . Occupation: Data entry    Employer: [employer]  . Financial resource strain: Not on file  . Food insecurity:    Worry: Not on file    Inability: Not on file  . Transportation needs:    Medical: Not on file    Non-medical: Not on file  Tobacco Use  . Smoking status: Former Smoker    Packs/day: 0.00    Last attempt to quit: 09/07/1998    Years since quitting: 19.4  . Smokeless tobacco: Never Used  Substance and Sexual Activity  . Alcohol use: Yes    Comment: occasionally  . Drug use: No  . Sexual activity: Yes    Birth control/protection: Pill    Comment: microgestin  Lifestyle  . Physical activity:    Days per week: Not on file    Minutes per session: Not on file  . Stress: Not on file  Relationships  . Social connections:    Talks on phone: Not on file    Gets together: Not on file    Attends religious service: Not on file    Active member of club or organization: Not on file    Attends meetings of clubs or organizations: Not on file    Relationship status: Not on file  Other Topics Concern  . Not on file  Social History Narrative   Works in customer service   Married    Vitals:   02/20/18 1616  BP: 126/82  Pulse: 76  Resp: 12  Temp: 98.6 F (37 C)  SpO2: 97%   Body mass index is 49.09 kg/m.    Physical Exam  Nursing note and vitals reviewed. Constitutional: She is oriented to person, place, and time. She appears well-developed. No distress.  HENT:  Head: Normocephalic and atraumatic.  Mouth/Throat: Oropharynx is clear and moist and mucous membranes are normal.  Eyes: Pupils are equal, round, and reactive to light. Conjunctivae are normal.  Cardiovascular: Normal rate and regular rhythm.  No murmur heard. Pulses:      Dorsalis pedis pulses are 2+ on the right side, and 2+ on the left side.  Respiratory: Effort normal and breath sounds normal. No respiratory distress.  GI: Soft. She exhibits no  mass. There is no hepatomegaly. There is no tenderness.  Musculoskeletal: She exhibits no edema.  Lymphadenopathy:    She has no cervical adenopathy.  Neurological: She is alert and oriented to person, place, and time. She has normal strength. No cranial nerve deficit. Gait normal.  Skin: Skin is warm. No erythema.  Psychiatric: She has a normal mood and affect.  Well groomed, good eye contact.      ASSESSMENT AND PLAN:   Ms. [name] was seen today for palpitations.  Diagnoses and all orders for this visit: Lab Results  Component Value Date   CREATININE 0.60 02/20/2018   BUN 9 02/20/2018   NA 137 02/20/2018   K 4.2 02/20/2018   CL 100 02/20/2018   CO2 25 02/20/2018   Lab Results  Component Value Date   TSH 2.65 02/20/2018    Heart palpitations  We discussed possible etiologies. History,CV risk factors, and physical findings do not suggest a serious condition. EKG done today: SR,normal axis and intervals, no signs of ischemia.Compared with EKG in 12/2017 no significant changes.  I recommended decreasing caffeine intake and adequate hydration. Further recommendations will be given according to lab results. If problem is persistent or if it gets worse we will consider cardiology evaluation.  She voices understanding and agrees with plan.  -     Basic metabolic panel -     EKG [id number] -     TSH  Essential hypertension  Well controlled,for now no changes. If palpitations continue we could add a BB. Continue low salt diet.     Ranata Laughery G. Martinique, MD  [facility]. [place] office.

## 2018-02-21 LAB — BASIC METABOLIC PANEL
BUN: 9 mg/dL (ref 6–23)
CHLORIDE: 100 meq/L (ref 96–112)
CO2: 25 mEq/L (ref 19–32)
Calcium: 9.9 mg/dL (ref 8.4–10.5)
Creatinine, Ser: 0.6 mg/dL (ref 0.40–1.20)
GFR: 116.61 mL/min (ref >60.00)
Glucose, Bld: 72 mg/dL (ref 70–99)
POTASSIUM: 4.2 meq/L (ref 3.5–5.1)
Sodium: 137 mEq/L (ref 135–145)

## 2018-02-21 LAB — TSH: TSH: 2.65 u[IU]/mL (ref 0.35–4.50)

## 2018-02-23 ENCOUNTER — Telehealth: Payer: Self-pay | Admitting: Family Medicine

## 2018-02-23 NOTE — Telephone Encounter (Signed)
Copied from [place] [id number]. Topic: Quick Communication - See Telephone Encounter >> Feb 23, 2018 10:01 AM Rutherford Nail, NT wrote: CRM for notification. See Telephone encounter for: 02/23/18. Patient states that she was seen in the office by Dr. Martinique for her heart palpitations. States that they have not gotten any better, that it is more frequently. States Dr Martinique stated she could change one of her BP medications or do a referral to cardiology. Patient would like a call back to discuss which options are better for her. CB#: [phone]

## 2018-02-24 NOTE — Telephone Encounter (Signed)
Spoke with patient and she stated that sx really haven't changed, seem like they are happening more often and would like to go ahead with the referral to cardiology, preferably a [facility] office and try the change in B/P medication that was discussed in the office visit.

## 2018-02-26 ENCOUNTER — Encounter: Payer: Self-pay | Admitting: Family Medicine

## 2018-02-27 ENCOUNTER — Other Ambulatory Visit: Payer: Self-pay | Admitting: Family Medicine

## 2018-02-27 DIAGNOSIS — R002 Palpitations: Secondary | ICD-10-CM

## 2018-02-27 MED ORDER — AMLODIPINE BESYLATE 5 MG PO TABS: 2.5000 mg | ORAL_TABLET | Freq: Every day | ORAL | 1 refills | Status: DC

## 2018-02-27 MED ORDER — METOPROLOL TARTRATE 25 MG PO TABS: 25.0000 mg | ORAL_TABLET | Freq: Two times a day (BID) | ORAL | 1 refills | Status: DC

## 2018-02-27 NOTE — Telephone Encounter (Signed)
Cardiology referral was placed. Prescription for metoprolol 25 mg to take twice daily was sent to her local pharmacy. Decrease amlodipine dose from 58m to 2.5 mg. Follow-up in 4 weeks if appointment with cardiologist cannot be arranged before.  Thanks, BJ

## 2018-02-28 ENCOUNTER — Ambulatory Visit (INDEPENDENT_AMBULATORY_CARE_PROVIDER_SITE_OTHER): Payer: 59 | Admitting: Psychology

## 2018-02-28 DIAGNOSIS — F331 Major depressive disorder, recurrent, moderate: Secondary | ICD-10-CM

## 2018-02-28 NOTE — Telephone Encounter (Signed)
Left vm for patient with instructions and told patient to give me a call if she has any questions or concerns.

## 2018-02-28 NOTE — Progress Notes (Signed)
   THERAPIST PROGRESS NOTE  Session Time: 10am-10.35am  Participation Level: Active  Behavioral Response: Well GroomedAlertaffect wnl  Type of Therapy: Individual Therapy  Treatment Goals addressed: Diagnosis: MDd and goal 1.  Interventions: CBT and Supportive  Summary: [name] is a 43 y.o. female who presents with affect wnl.  Pt reported that things had been going pretty well till stressor over the weekend.  Pt reported she had noticed being less irritable and less yelling overall.  Pt reported that over weekend husband who had been stable- had SI that seemed to come out of no where and was admitted inpt.  She reported didn't handle well but was able to identify that actually after intial in crisis of not handling well- was able to assist and get help needed.  Pt reported that he returned home 2 days ago and has been positive since- able to talk and resolved.  Pt open to practice of developing gratitude practice daily.  Pt discussed ways to incorporate.     Suicidal/Homicidal: Nowithout intent/plan  Therapist Response: Assessed pt current functioning per pt report. Processed w/pt recent stressor and assisted pt in recognizing ways did handle well and other recent positives.  Explored w/pt pattern of negative thought and ways of developing gratitude practice.   Plan: Return again in 2 weeks.  Diagnosis: MDD  Jan Fireman, [facility] 02/28/2018

## 2018-03-28 ENCOUNTER — Encounter: Payer: Self-pay | Admitting: Cardiology

## 2018-03-28 ENCOUNTER — Ambulatory Visit (INDEPENDENT_AMBULATORY_CARE_PROVIDER_SITE_OTHER): Payer: 59 | Admitting: Cardiology

## 2018-03-28 VITALS — BP 126/80 | HR 86 | Ht 60.0 in | Wt 254.0 lb

## 2018-03-28 DIAGNOSIS — R0789 Other chest pain: Secondary | ICD-10-CM | POA: Insufficient documentation

## 2018-03-28 DIAGNOSIS — R002 Palpitations: Secondary | ICD-10-CM

## 2018-03-28 DIAGNOSIS — N952 Postmenopausal atrophic vaginitis: Secondary | ICD-10-CM | POA: Insufficient documentation

## 2018-03-28 DIAGNOSIS — E119 Type 2 diabetes mellitus without complications: Secondary | ICD-10-CM | POA: Diagnosis not present

## 2018-03-28 DIAGNOSIS — R51 Headache: Secondary | ICD-10-CM

## 2018-03-28 DIAGNOSIS — I10 Essential (primary) hypertension: Secondary | ICD-10-CM

## 2018-03-28 DIAGNOSIS — D259 Leiomyoma of uterus, unspecified: Secondary | ICD-10-CM | POA: Insufficient documentation

## 2018-03-28 DIAGNOSIS — R519 Headache, unspecified: Secondary | ICD-10-CM | POA: Insufficient documentation

## 2018-03-28 DIAGNOSIS — F419 Anxiety disorder, unspecified: Secondary | ICD-10-CM | POA: Insufficient documentation

## 2018-03-28 DIAGNOSIS — G5601 Carpal tunnel syndrome, right upper limb: Secondary | ICD-10-CM | POA: Insufficient documentation

## 2018-03-28 DIAGNOSIS — R309 Painful micturition, unspecified: Secondary | ICD-10-CM | POA: Insufficient documentation

## 2018-03-28 NOTE — Progress Notes (Signed)
Cardiology Office Note:    Date:  03/28/2018   ID:  [name], DOB October 01, 1976, MRN [id number]  PCP:  Martinique, Betty G, MD  Cardiologist:  Jenean Lindau, MD   Referring MD: Martinique, Betty G, MD    ASSESSMENT:    1. Chest tightness   2. Essential hypertension   3. Type 2 diabetes mellitus without complication, without long-term current use of insulin (HCC)   4. Palpitations   5. Morbid obesity ([place])    PLAN:    In order of problems listed above:  1. Primary prevention stressed with the patient.  Importance of compliance with diet and medications stressed and she vocalized understanding. 2. Her blood pressure is stable.  Diet was discussed with dyslipidemia and diabetes mellitus.  Weight reduction was stressed.  Risks of obesity explained and she vocalized understanding. 3. In view of her palpitations she will undergo 48-hour Holter monitor.  If this is negative I told her we will consider a more extended.  Of monitoring especially because her palpitations are concerning.  Also her TSH was unremarkable. 4. In view of her chest tightness and multiple risk factors for coronary artery disease she will undergo exercise stress echo.  She knows to go to the nearest emergency room for any significant symptoms.  Her chest pain symptoms are atypical however she is a diabetic and we will make sure that we will rule out any objective evidence of coronary artery disease. 5. Patient will be seen in follow-up appointment in 2 months or earlier if the patient has any concerns    Medication Adjustments/Labs and Tests Ordered: Current medicines are reviewed at length with the patient today.  Concerns regarding medicines are outlined above.  Orders Placed This Encounter  Procedures  . HOLTER MONITOR - 48 HOUR  . ECHOCARDIOGRAM STRESS TEST   No orders of the defined types were placed in this encounter.    History of Present Illness:    [name] is a 42 y.o. female who is being seen  today for the evaluation of chest tightness and palpitations at the request of Martinique, Malka So, MD.  Patient is a pleasant 42 year old female.  She has past medical history of essential hypertension, diabetes mellitus, morbid obesity and chest tightness.  She mentions to me that she has palpitations almost on a daily basis.  No syncope.  No orthopnea or PND.  She leads a sedentary lifestyle.  She has occasional chest tightness with no radiation to the neck or to the arm.  She mentions to me that her chest tightness does not occur with sexual activity or any physical exertion.  At the time of my evaluation, the patient is alert awake oriented and in no distress.  Past Medical History:  Diagnosis Date  . Anxiety   . Carpal tunnel syndrome of left wrist 11/2015  . Chronic low back pain   . Cough 12/08/2015  . Depression   . History of kidney stones   . Hyperlipemia 12/02/2015  . Hypertension    states under control with med., has been on med. < 9 mos.  . Migraines   . Non-insulin dependent type 2 diabetes mellitus ([place])   . Polycystic ovarian syndrome   . Renal disorder   . Tension headache, chronic     Past Surgical History:  Procedure Laterality Date  . CARPAL TUNNEL RELEASE Right 09/16/2015   Procedure: RIGHT CARPAL TUNNEL RELEASE;  Surgeon: Leanora Cover, MD;  Location: [place];  Service: Orthopedics;  Laterality: Right;  . CARPAL TUNNEL RELEASE Left 12/12/2015   Procedure: LEFT CARPAL TUNNEL RELEASE;  Surgeon: Leanora Cover, MD;  Location: [place];  Service: Orthopedics;  Laterality: Left;  . CESAREAN SECTION  05/06/2007  . EXTRACORPOREAL SHOCK WAVE LITHOTRIPSY  11/22/2011  . LEEP N/A 03/17/2015   Procedure: LOOP ELECTROSURGICAL EXCISION PROCEDURE (LEEP) and Cold Knife [place];  Surgeon: Eldred Manges, MD;  Location: [facility];  Service: Gynecology;  Laterality: N/A;  . WISDOM TOOTH EXTRACTION      Current Medications: Current Meds  Medication Sig  .  amLODipine (NORVASC) 5 MG tablet Take 0.5 tablets (2.5 mg total) by mouth daily.  Marland Kitchen glucose blood test strip Use to test blood sugar once daily.  Marland Kitchen losartan (COZAAR) 100 MG tablet TAKE 1 TABLET BY MOUTH  DAILY  . metFORMIN (GLUCOPHAGE) 1000 MG tablet Take 1 tablet (1,000 mg total) by mouth 2 (two) times daily with a meal.  . metoprolol tartrate (LOPRESSOR) 25 MG tablet Take 1 tablet (25 mg total) by mouth 2 (two) times daily.  . norethindrone (HEATHER) 0.35 MG tablet Take 1 tablet by mouth daily.  Glory Rosebush DELICA LANCETS 01V MISC Use to test blood sugar once daily.  . rosuvastatin (CRESTOR) 10 MG tablet TAKE 1 TABLET BY MOUTH  DAILY  . sertraline (ZOLOFT) 100 MG tablet Take 2 tablets (200 mg total) by mouth daily. (Patient taking differently: Take 150 mg by mouth daily. )  . traZODone (DESYREL) 150 MG tablet Take 1 tablet (150 mg total) by mouth as needed.  . [DISCONTINUED] Melatonin 3 MG TABS Take 3 mg by mouth.     Allergies:   Patient has no known allergies.   Social History   Socioeconomic History  . Marital status: Married    Spouse name: Not on file  . Number of children: 1  . Years of education: Not on file  . Highest education level: Not on file  Occupational History  . Occupation: Data entry    Employer: [employer]  . Financial resource strain: Not on file  . Food insecurity:    Worry: Not on file    Inability: Not on file  . Transportation needs:    Medical: Not on file    Non-medical: Not on file  Tobacco Use  . Smoking status: Former Smoker    Packs/day: 0.00    Last attempt to quit: 09/07/1998    Years since quitting: 19.5  . Smokeless tobacco: Never Used  Substance and Sexual Activity  . Alcohol use: Yes    Comment: occasionally  . Drug use: No  . Sexual activity: Yes    Birth control/protection: Pill    Comment: microgestin  Lifestyle  . Physical activity:    Days per week: Not on file    Minutes per session: Not on file  .  Stress: Not on file  Relationships  . Social connections:    Talks on phone: Not on file    Gets together: Not on file    Attends religious service: Not on file    Active member of club or organization: Not on file    Attends meetings of clubs or organizations: Not on file    Relationship status: Not on file  Other Topics Concern  . Not on file  Social History Narrative   Works in Therapist, art   Married     Family History: The patient's family history includes Arthritis in her  mother; Bipolar disorder in her father; Breast cancer in her paternal aunt; Depression in her paternal aunt; Melanoma in her father; Mental illness in her father.  ROS:   Please see the history of present illness.    All other systems reviewed and are negative.  EKGs/Labs/Other Studies Reviewed:    The following studies were reviewed today: I reviewed her labs and EKG and discussed with the patient.   Recent Labs: 12/14/2017: ALT 19 12/22/2017: Hemoglobin 14.0; Platelets 460 02/20/2018: BUN 9; Creatinine, Ser 0.60; Potassium 4.2; Sodium 137; TSH 2.65  Recent Lipid Panel    Component Value Date/Time   CHOL 130 02/21/2017 1117   TRIG 257.0 (H) 02/21/2017 1117   HDL 42.20 02/21/2017 1117   CHOLHDL 3 02/21/2017 1117   VLDL 51.4 (H) 02/21/2017 1117   LDLDIRECT 50.0 02/21/2017 1117    Physical Exam:    VS:  BP 126/80 (BP Location: Right Arm, Patient Position: Sitting, Cuff Size: Normal)   Pulse 86   Ht 5' (1.524 m)   Wt 254 lb (115.2 kg)   SpO2 98%   BMI 49.61 kg/m     Wt Readings from Last 3 Encounters:  03/28/18 254 lb (115.2 kg)  02/20/18 251 lb 6 oz (114 kg)  12/23/17 244 lb (110.7 kg)     GEN: Patient is in no acute distress HEENT: Normal NECK: No JVD; No carotid bruits LYMPHATICS: No lymphadenopathy CARDIAC: S1 S2 regular, 2/6 systolic murmur at the apex. RESPIRATORY:  Clear to auscultation without rales, wheezing or rhonchi  ABDOMEN: Soft, non-tender,  non-distended MUSCULOSKELETAL:  No edema; No deformity  SKIN: Warm and dry NEUROLOGIC:  Alert and oriented x 3 PSYCHIATRIC:  Normal affect    Signed, Jenean Lindau, MD  03/28/2018 9:38 AM    [place]

## 2018-03-28 NOTE — Patient Instructions (Signed)
Medication Instructions: Your physician recommends that you continue on your current medications as directed. Please refer to the Current Medication list given to you today.  Labwork: None  Testing/Procedures: Your physician has requested that you have a stress echocardiogram. For further information please visit [email or web address]. Please follow instruction sheet as given.  Your physician has recommended that you wear a holter monitor. Holter monitors are medical devices that record the heart's electrical activity. Doctors most often use these monitors to diagnose arrhythmias. Arrhythmias are problems with the speed or rhythm of the heartbeat. The monitor is a small, portable device. You can wear one while you do your normal daily activities. This is usually used to diagnose what is causing palpitations/syncope (passing out).  Follow-Up: Your physician recommends that you schedule a follow-up appointment in: 2 months  Any Other Special Instructions Will Be Listed Below (If Applicable).     If you need a refill on your cardiac medications before your next appointment, please call your pharmacy.   [place], RN, BSN    Holter Monitoring A Holter monitor is a small device that is used to detect abnormal heart rhythms. It clips to your clothing and is connected by wires to flat, sticky disks (electrodes) that attach to your chest. It is worn continuously for 24-48 hours. Follow these instructions at home:  Wear your Holter monitor at all times, even while exercising and sleeping, for as long as directed by your health care provider.  Make sure that the Holter monitor is safely clipped to your clothing or close to your body as recommended by your health care provider.  Do not get the monitor or wires wet.  Do not put body lotion or moisturizer on your chest.  Keep your skin clean.  Keep a diary of your daily activities, such as walking and doing chores. If you  feel that your heartbeat is abnormal or that your heart is fluttering or skipping a beat: ? Record what you are doing when it happens. ? Record what time of day the symptoms occur.  Return your Holter monitor as directed by your health care provider.  Keep all follow-up visits as directed by your health care provider. This is important. Get help right away if:  You feel lightheaded or you faint.  You have trouble breathing.  You feel pain in your chest, upper arm, or jaw.  You feel sick to your stomach and your skin is pale, cool, or damp.  You heartbeat feels unusual or abnormal. This information is not intended to replace advice given to you by your health care provider. Make sure you discuss any questions you have with your health care provider. Document Released: 07/09/2004 Document Revised: 03/18/2016 Document Reviewed: 05/20/2014 Elsevier Interactive Patient Education  2018 [employer].  Cardiopulmonary Exercise Stress Test Cardiopulmonary exercise testing (CPET) is a test that checks how your heart and lungs react to exercise. This is called your exercise capacity. During this test, you will walk or run on a treadmill or pedal on a stationary bike while tests are done on your heart and lungs. You may have this test to:  See why you are short of breath.  Check for exercise intolerance.  See how your lungs work.  See how your heart works.  Check for how you are responding to a heart or lung rehabilitation program.  See if you have a heart or lung problem.  See if you are healthy enough to have surgery.  What  happens before the procedure?  Follow instructions from your doctor about what you cannot eat or drink.  Ask your doctor about changing or stopping your normal medicines. This is important if you take diabetes medicines or blood thinners.  Wear loose, comfortable clothing and shoes.  If you use an inhaler, bring it with you to the test. What happens during  the procedure?  A blood pressure cuff will be placed on your arm.  Several stick-on patches (electrodes) will be placed on your chest. They will be attached to an electrocardiogram (EKG) machine.  A clip-on monitor that measures the amount of oxygen in your blood will be placed on your finger (pulse oximeter).  A clip will be placed on your nose and a mouthpiece will be placed in your mouth. This may be held in place with a headpiece. You will breathe through the mouthpiece during the test.  You will be asked to start exercising. You will be closely watched while you exercise.  The amount of effort for your exercise will be gradually increased.  During exercise, the test will measure: ? Your heart rate. ? Your heart rhythm. ? Your oxygen blood level. ? The amount of oxygen and carbon dioxide that you breathe out.  The test will end when: ? You have finished the test. ? You have reached your maximum ability to exercise. ? You have chest or leg pain, dizziness, or shortness of breath. The procedure may vary among doctors and hospitals. What happens after the procedure?  Your blood pressure and EKG will be checked to watch how you recover from the test. This information is not intended to replace advice given to you by your health care provider. Make sure you discuss any questions you have with your health care provider. Document Released: 09/29/2009 Document Revised: 03/02/2016 Document Reviewed: 08/25/2015 Elsevier Interactive Patient Education  2018 [employer].

## 2018-03-30 ENCOUNTER — Ambulatory Visit (INDEPENDENT_AMBULATORY_CARE_PROVIDER_SITE_OTHER): Payer: 59 | Admitting: Psychiatry

## 2018-03-30 ENCOUNTER — Encounter (HOSPITAL_COMMUNITY): Payer: Self-pay | Admitting: Psychiatry

## 2018-03-30 VITALS — BP 132/84 | HR 95 | Ht 60.0 in | Wt 254.0 lb

## 2018-03-30 DIAGNOSIS — F419 Anxiety disorder, unspecified: Secondary | ICD-10-CM

## 2018-03-30 DIAGNOSIS — Z87891 Personal history of nicotine dependence: Secondary | ICD-10-CM | POA: Diagnosis not present

## 2018-03-30 DIAGNOSIS — R632 Polyphagia: Secondary | ICD-10-CM | POA: Diagnosis not present

## 2018-03-30 DIAGNOSIS — F1099 Alcohol use, unspecified with unspecified alcohol-induced disorder: Secondary | ICD-10-CM

## 2018-03-30 DIAGNOSIS — Z793 Long term (current) use of hormonal contraceptives: Secondary | ICD-10-CM

## 2018-03-30 DIAGNOSIS — F331 Major depressive disorder, recurrent, moderate: Secondary | ICD-10-CM

## 2018-03-30 DIAGNOSIS — E559 Vitamin D deficiency, unspecified: Secondary | ICD-10-CM

## 2018-03-30 DIAGNOSIS — Z818 Family history of other mental and behavioral disorders: Secondary | ICD-10-CM

## 2018-03-30 MED ORDER — BUPROPION HCL ER (SR) 150 MG PO TB12: 150.0000 mg | ORAL_TABLET | Freq: Every day | ORAL | 0 refills | Status: DC

## 2018-03-30 NOTE — Progress Notes (Signed)
BH MD/PA/NP OP Progress Note  03/30/2018 1:09 PM [name]  MRN:  [id number]  Chief Complaint:  HPI: [name] feels overall stable with mood and anxiety, sleeping well at night.  Recently saw cardiology and is trying to work on diet and exercise habits.  I suggested we initiate a low-dose of Wellbutrin to assist with energy and appetite stabilization given her issues with overeating.  Reviewed the risks of increased anxiety, confirmed that patient has no history of seizures or bulimia.  We will follow-up in 3 months or sooner if needed.  Visit Diagnosis:    ICD-10-CM   1. Moderate episode of recurrent major depressive disorder (HCC) F33.1   2. Hypovitaminosis D E55.9     Past Psychiatric History: See intake H&P for full details. Reviewed, with no updates at this time.   Past Medical History:  Past Medical History:  Diagnosis Date  . Anxiety   . Carpal tunnel syndrome of left wrist 11/2015  . Chronic low back pain   . Cough 12/08/2015  . Depression   . History of kidney stones   . Hyperlipemia 12/02/2015  . Hypertension    states under control with med., has been on med. < 9 mos.  . Migraines   . Non-insulin dependent type 2 diabetes mellitus ([place])   . Polycystic ovarian syndrome   . Renal disorder   . Tension headache, chronic     Past Surgical History:  Procedure Laterality Date  . CARPAL TUNNEL RELEASE Right 09/16/2015   Procedure: RIGHT CARPAL TUNNEL RELEASE;  Surgeon: Leanora Cover, MD;  Location: [facility];  Service: Orthopedics;  Laterality: Right;  . CARPAL TUNNEL RELEASE Left 12/12/2015   Procedure: LEFT CARPAL TUNNEL RELEASE;  Surgeon: Leanora Cover, MD;  Location: [facility];  Service: Orthopedics;  Laterality: Left;  . CESAREAN SECTION  05/06/2007  . EXTRACORPOREAL SHOCK WAVE LITHOTRIPSY  11/22/2011  . LEEP N/A 03/17/2015   Procedure: LOOP ELECTROSURGICAL EXCISION PROCEDURE (LEEP) and Cold Knife [place];  Surgeon: Eldred Manges, MD;  Location: [facility];  Service: Gynecology;  Laterality: N/A;  . WISDOM TOOTH EXTRACTION      Family Psychiatric History: See intake H&P for full details. Reviewed, with no updates at this time.   Family History:  Family History  Problem Relation Age of Onset  . Arthritis Mother   . Breast cancer Paternal Aunt   . Depression Paternal Aunt   . Mental illness Father   . Melanoma Father   . Bipolar disorder Father     Social History:  Social History   Socioeconomic History  . Marital status: Married    Spouse name: Not on file  . Number of children: 1  . Years of education: Not on file  . Highest education level: Not on file  Occupational History  . Occupation: Data entry    Employer: [employer]  . Financial resource strain: Not on file  . Food insecurity:    Worry: Not on file    Inability: Not on file  . Transportation needs:    Medical: Not on file    Non-medical: Not on file  Tobacco Use  . Smoking status: Former Smoker    Packs/day: 0.00    Last attempt to quit: 09/07/1998    Years since quitting: 19.5  . Smokeless tobacco: Never Used  Substance and Sexual Activity  . Alcohol use: Yes    Comment: occasionally  . Drug use: Yes  Frequency: 2.0 times per week    Types: Marijuana  . Sexual activity: Yes    Partners: Male    Birth control/protection: Pill    Comment: microgestin  Lifestyle  . Physical activity:    Days per week: Not on file    Minutes per session: Not on file  . Stress: Not on file  Relationships  . Social connections:    Talks on phone: Not on file    Gets together: Not on file    Attends religious service: Not on file    Active member of club or organization: Not on file    Attends meetings of clubs or organizations: Not on file    Relationship status: Not on file  Other Topics Concern  . Not on file  Social History Narrative   Works in customer service   Married    Allergies: No Known  Allergies  Metabolic Disorder Labs: Lab Results  Component Value Date   HGBA1C 6.3 12/23/2017   MPG 114 11/08/2012   No results found for: PROLACTIN Lab Results  Component Value Date   CHOL 130 02/21/2017   TRIG 257.0 (H) 02/21/2017   HDL 42.20 02/21/2017   CHOLHDL 3 02/21/2017   VLDL 51.4 (H) 02/21/2017   Lab Results  Component Value Date   TSH 2.65 02/20/2018   TSH 3.360 12/14/2017    Therapeutic Level Labs: No results found for: LITHIUM No results found for: VALPROATE No components found for:  CBMZ  Current Medications: Current Outpatient Medications  Medication Sig Dispense Refill  . amLODipine (NORVASC) 5 MG tablet Take 0.5 tablets (2.5 mg total) by mouth daily. 90 tablet 1  . buPROPion (WELLBUTRIN SR) 150 MG 12 hr tablet Take 1 tablet (150 mg total) by mouth daily. 30 tablet 0  . glucose blood test strip Use to test blood sugar once daily. 100 each 2  . losartan (COZAAR) 100 MG tablet TAKE 1 TABLET BY MOUTH  DAILY 90 tablet 1  . metFORMIN (GLUCOPHAGE) 1000 MG tablet Take 1 tablet (1,000 mg total) by mouth 2 (two) times daily with a meal. 180 tablet 1  . metoprolol tartrate (LOPRESSOR) 25 MG tablet Take 1 tablet (25 mg total) by mouth 2 (two) times daily. 30 tablet 1  . norethindrone (HEATHER) 0.35 MG tablet Take 1 tablet by mouth daily.    [name] DELICA LANCETS 03J MISC Use to test blood sugar once daily. 100 each 2  . rosuvastatin (CRESTOR) 10 MG tablet TAKE 1 TABLET BY MOUTH  DAILY 90 tablet 4  . sertraline (ZOLOFT) 100 MG tablet Take 2 tablets (200 mg total) by mouth daily. (Patient taking differently: Take 150 mg by mouth daily. ) 180 tablet 1  . traZODone (DESYREL) 150 MG tablet Take 1 tablet (150 mg total) by mouth as needed. 90 tablet 0   No current facility-administered medications for this visit.      Musculoskeletal: Strength & Muscle Tone: within normal limits Gait & Station: normal Patient leans: N/A  Psychiatric Specialty Exam: ROS  Blood  pressure 132/84, pulse 95, height 5' (1.524 m), weight 254 lb (115.2 kg), SpO2 94 %.Body mass index is 49.61 kg/m.  General Appearance: Casual and Well Groomed  Eye Contact:  Good  Speech:  Clear and Coherent and Normal Rate  Volume:  Normal  Mood:  Euthymic  Affect:  Appropriate and Congruent  Thought Process:  Goal Directed and Descriptions of Associations: Intact  Orientation:  Full (Time, Place, and Person)  Thought  Content: Logical   Suicidal Thoughts:  No  Homicidal Thoughts:  No  Memory:  Immediate;   Fair  Judgement:  Good  Insight:  Good  Psychomotor Activity:  Normal  Concentration:  Concentration: Good  Recall:  Good  Fund of Knowledge: Good  Language: Good  Akathisia:  Negative  Handed:  Right  AIMS (if indicated): not done  Assets:  Communication Skills Desire for Improvement Financial Resources/Insurance Housing Intimacy Leisure Time [place] Talents/Skills Transportation Vocational/Educational  ADL's:  Intact  Cognition: WNL  Sleep:  Good   Screenings: GAD-7     Counselor from 01/19/2018 in [place]  Total GAD-7 Score  12    PHQ2-9     Counselor from 01/19/2018 in [place] [facility] Nutrition from 09/15/2015 in [facility]  PHQ-2 Total Score  3  0  0  PHQ-9 Total Score  11  -  -       Assessment and Plan: [name] has been tolerating Zoloft and trazodone well, with good sleep and mood overall.  Still has some flattening of affect and energy, difficulty with motivation to exercise, some overeating.  We agreed to initiate augmentation with Wellbutrin 150 mg sustained release.  Reviewed the risks and benefits and agreed to follow-up in 3 months.  Disclosed to patient that this Probation officer is leaving this practice at the end of August 2019, and patients  always has the right to choose their provider. Reassured patient that office will work to provide smooth transition of care whether they wish to remain at this office, or to continue with this provider, or seek alternative care options in community.  They expressed understanding.   1. Moderate episode of recurrent major depressive disorder ([place])   2. Hypovitaminosis D     Status of current problems: stable  Labs Ordered: No orders of the defined types were placed in this encounter.   Labs Reviewed: na  Collateral Obtained/Records Reviewed: I reviewed the cardiology note, and agree with their suggestions and interventions.  Spent time with patient echoing their suggestions regarding dietary changes and lifestyle changes for her heart health  Plan:  Continue Zoloft and trazodone as prescribed Initiate Wellbutrin 150 mg SR daily to help promote increased activity, reduction of appetite, increased energy rtc 3 months  Aundra Dubin, MD 03/30/2018, 1:09 PM

## 2018-03-31 ENCOUNTER — Other Ambulatory Visit: Payer: Self-pay | Admitting: Family Medicine

## 2018-03-31 DIAGNOSIS — R002 Palpitations: Secondary | ICD-10-CM

## 2018-04-05 ENCOUNTER — Encounter (HOSPITAL_COMMUNITY): Payer: Self-pay | Admitting: Psychology

## 2018-04-05 ENCOUNTER — Ambulatory Visit (HOSPITAL_COMMUNITY): Payer: Self-pay | Admitting: Psychology

## 2018-04-06 ENCOUNTER — Encounter (HOSPITAL_COMMUNITY): Payer: Self-pay | Admitting: Psychology

## 2018-04-06 NOTE — Progress Notes (Signed)
[name] is a 42 y.o. female patient who didn't show for appointment.  Letter sent.        Jan Fireman, LPC

## 2018-04-17 ENCOUNTER — Other Ambulatory Visit: Payer: Self-pay | Admitting: Family Medicine

## 2018-04-21 ENCOUNTER — Ambulatory Visit: Payer: 59

## 2018-04-21 ENCOUNTER — Ambulatory Visit (HOSPITAL_BASED_OUTPATIENT_CLINIC_OR_DEPARTMENT_OTHER)
Admission: RE | Admit: 2018-04-21 | Discharge: 2018-04-21 | Disposition: A | Discharge status: Home / Self Care | Payer: 59 | Source: Ambulatory Visit | Attending: Cardiology | Admitting: Cardiology

## 2018-04-21 DIAGNOSIS — R0789 Other chest pain: Secondary | ICD-10-CM | POA: Diagnosis not present

## 2018-04-21 DIAGNOSIS — E785 Hyperlipidemia, unspecified: Secondary | ICD-10-CM | POA: Insufficient documentation

## 2018-04-21 DIAGNOSIS — E119 Type 2 diabetes mellitus without complications: Secondary | ICD-10-CM | POA: Insufficient documentation

## 2018-04-21 DIAGNOSIS — Z6841 Body Mass Index (BMI) 40.0 and over, adult: Secondary | ICD-10-CM | POA: Diagnosis not present

## 2018-04-21 DIAGNOSIS — E669 Obesity, unspecified: Secondary | ICD-10-CM | POA: Diagnosis not present

## 2018-04-21 DIAGNOSIS — I10 Essential (primary) hypertension: Secondary | ICD-10-CM | POA: Diagnosis not present

## 2018-04-21 DIAGNOSIS — R002 Palpitations: Secondary | ICD-10-CM | POA: Insufficient documentation

## 2018-04-21 NOTE — Progress Notes (Signed)
  Echocardiogram Echocardiogram Stress Test has been performed.  Joelene Millin 04/21/2018, 3:36 PM

## 2018-04-25 ENCOUNTER — Other Ambulatory Visit: Payer: Self-pay | Admitting: Family Medicine

## 2018-04-25 DIAGNOSIS — E119 Type 2 diabetes mellitus without complications: Secondary | ICD-10-CM

## 2018-04-26 ENCOUNTER — Telehealth (HOSPITAL_COMMUNITY): Payer: Self-pay

## 2018-04-26 NOTE — Telephone Encounter (Signed)
She definitely needs a follow up visit scheduled in the next 2-3 months.  Okay to send 30 days + 1 refill and check in to see what her plans are regarding follow-up

## 2018-04-26 NOTE — Telephone Encounter (Signed)
Received medication fax request for Bupropion 120m tabs.  No future appointment has been scheduled. Please advise

## 2018-04-28 ENCOUNTER — Other Ambulatory Visit (HOSPITAL_COMMUNITY): Payer: Self-pay

## 2018-04-28 MED ORDER — BUPROPION HCL ER (SR) 150 MG PO TB12: 150.0000 mg | ORAL_TABLET | Freq: Every day | ORAL | 1 refills | Status: AC

## 2018-04-28 NOTE — Telephone Encounter (Signed)
Sent in refill and called and left voicemail message inquiring about follow up appointment

## 2018-05-04 ENCOUNTER — Other Ambulatory Visit: Payer: Self-pay | Admitting: Family Medicine

## 2018-05-10 ENCOUNTER — Encounter (INDEPENDENT_AMBULATORY_CARE_PROVIDER_SITE_OTHER): Payer: Self-pay

## 2018-06-22 ENCOUNTER — Other Ambulatory Visit (HOSPITAL_COMMUNITY): Payer: Self-pay | Admitting: Psychiatry

## 2018-06-24 ENCOUNTER — Other Ambulatory Visit: Payer: Self-pay | Admitting: Family Medicine

## 2018-06-24 DIAGNOSIS — R002 Palpitations: Secondary | ICD-10-CM

## 2018-06-26 NOTE — Progress Notes (Signed)
HPI:   [name] is a 42 y.o. female, who is here today for her routine physical.  Last CPE: Over a year.  Regular exercise 3 or more time per week: Not constantly. Following a healthy diet: Eating better., Hello fresh. She lives with her husband and 15 years old.  Chronic medical problems: DM II,HLD,HTN,obesity,anxiety,and depression among some.  Pap smear 05/30/18, she follows with gyn. Hx of abnormal pap smears: HSIL Hx of STD's: Just HPV.   Immunization History  Administered Date(s) Administered  . Influenza,inj,Quad PF,6+ Mos 08/06/2015, 08/02/2016  . Influenza-Unspecified 08/06/2017  . Pneumococcal Polysaccharide-23 12/02/2015  . Tdap 03/28/2015    Follow up and concerns today:  Lab Results  Component Value Date   HGBA1C 6.3 12/23/2017   BSs in the low 100's,max 115. Denies abdominal pain, nausea,vomiting, polydipsia or polyphagia.  Hyperlipidemia: Currently she is on Crestor 10 mg daily. She is trying to follow a low-fat diet. She is tolerating medication well, no side effects reported.  Lab Results  Component Value Date   CHOL 130 02/21/2017   HDL 42.20 02/21/2017   LDLDIRECT 50.0 02/21/2017   TRIG 257.0 (H) 02/21/2017   CHOLHDL 3 02/21/2017   Hypertension on Cozaar 100 mg daily and metoprolol titrate 25 mg twice daily.  -She thinks she has a UTI, she has had urine frequency for about a week. Nocturia x1. She has not noted fever, chills, abdominal pain, dysuria, gross hematuria, or incontinence. She has not tried OTC medications.   Review of Systems  Constitutional: Negative for appetite change, fatigue and fever.  HENT: Negative for dental problem, hearing loss, mouth sores, sore throat, trouble swallowing and voice change.   Eyes: Negative for redness and visual disturbance.  Respiratory: Negative for cough, shortness of breath and wheezing.   Cardiovascular: Negative for chest pain and leg swelling.  Gastrointestinal: Positive  for diarrhea (intermittent for 3 weeks). Negative for abdominal pain, nausea and vomiting.       No changes in bowel habits.  Endocrine: Negative for cold intolerance, heat intolerance, polydipsia, polyphagia and polyuria.  Genitourinary: Positive for frequency. Negative for decreased urine volume, dysuria, hematuria, vaginal bleeding and vaginal discharge.  Musculoskeletal: Negative for arthralgias, gait problem and myalgias.  Skin: Negative for color change and rash.  Allergic/Immunologic: Negative for environmental allergies.  Neurological: Negative for syncope, weakness and headaches.  Hematological: Negative for adenopathy. Does not bruise/bleed easily.  Psychiatric/Behavioral: Positive for sleep disturbance. Negative for confusion. The patient is nervous/anxious.   All other systems reviewed and are negative.     Current Outpatient Medications on File Prior to Visit  Medication Sig Dispense Refill  . amLODipine (NORVASC) 5 MG tablet Take 0.5 tablets (2.5 mg total) by mouth daily. 90 tablet 1  . buPROPion (WELLBUTRIN SR) 150 MG 12 hr tablet Take 1 tablet (150 mg total) by mouth daily. 30 tablet 1  . glucose blood test strip Use to test blood sugar once daily. 100 each 2  . losartan (COZAAR) 100 MG tablet TAKE 1 TABLET BY MOUTH  DAILY 90 tablet 1  . metFORMIN (GLUCOPHAGE) 1000 MG tablet TAKE 1 TABLET(1000 MG) BY MOUTH TWICE DAILY WITH A MEAL 180 tablet 3  . norethindrone (MICRONOR,CAMILA,ERRIN) 0.35 MG tablet Take by mouth.    Glory Rosebush DELICA LANCETS 85F MISC Use to test blood sugar once daily. 100 each 2  . rosuvastatin (CRESTOR) 10 MG tablet TAKE 1 TABLET BY MOUTH  DAILY 90 tablet 4  . sertraline (  ZOLOFT) 100 MG tablet Take 2 tablets (200 mg total) by mouth daily. (Patient taking differently: Take 150 mg by mouth daily. ) 180 tablet 1  . traZODone (DESYREL) 150 MG tablet TAKE 1 TABLET BY MOUTH  EVERY NIGHT AT BEDTIME AS  NEEDED 90 tablet 0  . metoprolol tartrate (LOPRESSOR) 25 MG  tablet TAKE 1 TABLET(25 MG) BY MOUTH TWICE DAILY 180 tablet 2   No current facility-administered medications on file prior to visit.      Past Medical History:  Diagnosis Date  . Anxiety   . Carpal tunnel syndrome of left wrist 11/2015  . Chronic low back pain   . Cough 12/08/2015  . Depression   . History of kidney stones   . Hyperlipemia 12/02/2015  . Hypertension    states under control with med., has been on med. < 9 mos.  . Migraines   . Non-insulin dependent type 2 diabetes mellitus ([place])   . Polycystic ovarian syndrome   . Renal disorder   . Tension headache, chronic     Past Surgical History:  Procedure Laterality Date  . CARPAL TUNNEL RELEASE Right 09/16/2015   Procedure: RIGHT CARPAL TUNNEL RELEASE;  Surgeon: Leanora Cover, MD;  Location: [facility];  Service: Orthopedics;  Laterality: Right;  . CARPAL TUNNEL RELEASE Left 12/12/2015   Procedure: LEFT CARPAL TUNNEL RELEASE;  Surgeon: Leanora Cover, MD;  Location: [facility];  Service: Orthopedics;  Laterality: Left;  . CESAREAN SECTION  05/06/2007  . EXTRACORPOREAL SHOCK WAVE LITHOTRIPSY  11/22/2011  . LEEP N/A 03/17/2015   Procedure: LOOP ELECTROSURGICAL EXCISION PROCEDURE (LEEP) and Cold Knife [place];  Surgeon: Eldred Manges, MD;  Location: [facility];  Service: Gynecology;  Laterality: N/A;  . WISDOM TOOTH EXTRACTION      No Known Allergies  Family History  Problem Relation Age of Onset  . Arthritis Mother   . Breast cancer Paternal Aunt   . Depression Paternal Aunt   . Mental illness Father   . Melanoma Father   . Bipolar disorder Father     Social History   Socioeconomic History  . Marital status: Married    Spouse name: Not on file  . Number of children: 1  . Years of education: Not on file  . Highest education level: Not on file  Occupational History  . Occupation: Data entry    Employer: [employer]  . Financial resource strain: Not on file  .  Food insecurity:    Worry: Not on file    Inability: Not on file  . Transportation needs:    Medical: Not on file    Non-medical: Not on file  Tobacco Use  . Smoking status: Former Smoker    Packs/day: 0.00    Last attempt to quit: 09/07/1998    Years since quitting: 19.8  . Smokeless tobacco: Never Used  Substance and Sexual Activity  . Alcohol use: Yes    Comment: occasionally  . Drug use: Yes    Frequency: 2.0 times per week    Types: Marijuana  . Sexual activity: Yes    Partners: Male    Birth control/protection: Pill    Comment: microgestin  Lifestyle  . Physical activity:    Days per week: Not on file    Minutes per session: Not on file  . Stress: Not on file  Relationships  . Social connections:    Talks on phone: Not on file    Gets together:  Not on file    Attends religious service: Not on file    Active member of club or organization: Not on file    Attends meetings of clubs or organizations: Not on file    Relationship status: Not on file  Other Topics Concern  . Not on file  Social History Narrative   Works in customer service   Married     Vitals:   06/27/18 0852  BP: 124/80  Pulse: 74  Resp: 12  Temp: 98.2 F (36.8 C)  SpO2: 96%   Body mass index is 47.9 kg/m.   Wt Readings from Last 3 Encounters:  06/27/18 245 lb 4 oz (111.2 kg)  03/28/18 254 lb (115.2 kg)  02/20/18 251 lb 6 oz (114 kg)     Physical Exam  Nursing note and vitals reviewed. Constitutional: She is oriented to person, place, and time. She appears well-developed. No distress.  HENT:  Head: Normocephalic and atraumatic.  Right Ear: Hearing, tympanic membrane, external ear and ear canal normal.  Left Ear: Hearing, tympanic membrane, external ear and ear canal normal.  Mouth/Throat: Uvula is midline, oropharynx is clear and moist and mucous membranes are normal.  Eyes: Pupils are equal, round, and reactive to light. Conjunctivae and EOM are normal.  Neck: No tracheal  deviation present. No thyromegaly present.  Cardiovascular: Normal rate and regular rhythm.  No murmur heard. Pulses:      Dorsalis pedis pulses are 2+ on the right side, and 2+ on the left side.  Respiratory: Effort normal and breath sounds normal. No respiratory distress.  GI: Soft. She exhibits no mass. There is no hepatomegaly. There is no tenderness.  Genitourinary:  Genitourinary Comments: Deferred to gyn.  Musculoskeletal: She exhibits no edema.  No major deformity or signs of synovitis appreciated.  Lymphadenopathy:    She has no cervical adenopathy.       Right: No supraclavicular adenopathy present.       Left: No supraclavicular adenopathy present.  Neurological: She is alert and oriented to person, place, and time. She has normal strength. No cranial nerve deficit. Coordination and gait normal.  Reflex Scores:      Bicep reflexes are 2+ on the right side and 2+ on the left side.      Patellar reflexes are 2+ on the right side and 2+ on the left side. Skin: Skin is warm. No rash noted. No erythema.  Psychiatric: She has a normal mood and affect. Her speech is normal.  Well groomed, good eye contact.      ASSESSMENT AND PLAN:  [name] was here today annual physical examination and follow up..  Orders Placed This Encounter  Procedures  . Urine culture  . Basic metabolic panel  . Lipid panel  . Hemoglobin A1c  . CBC with Differential/Platelet  . LDL cholesterol, direct  . POCT Urinalysis Dipstick (Automated)    Lab Results  Component Value Date   CREATININE 0.68 06/27/2018   BUN 8 06/27/2018   NA 136 06/27/2018   K 3.9 06/27/2018   CL 100 06/27/2018   CO2 24 06/27/2018   Lab Results  Component Value Date   WBC 12.3 (H) 06/27/2018   HGB 12.7 06/27/2018   HCT 38.0 06/27/2018   MCV 80.5 06/27/2018   PLT 470.0 (H) 06/27/2018   Lab Results  Component Value Date   HGBA1C 6.1 06/27/2018   Lab Results  Component Value Date   CHOL 201 (H)  06/27/2018  HDL 36.30 (L) 06/27/2018   LDLDIRECT 100.0 06/27/2018   TRIG (H) 06/27/2018    443.0 Triglyceride is over 400; calculations on Lipids are invalid.   CHOLHDL 6 06/27/2018     Routine general medical examination at a health care facility  We discussed the importance of regular physical activity and healthy diet for prevention of chronic illness and/or complications. Preventive guidelines reviewed. Vaccination up to date. She will continue following with her gyn for her female preventive care.  Next CPE in a year.  Urinary frequency  Urine dipstick today otherwise negative except for 1+ blood and protein. Urine sent for Cx.  -     POCT Urinalysis Dipstick (Automated)  Urinary tract infection without hematuria, site unspecified  Empiric abx treatment started today and will be tailored according to Ucx results and susceptibility report.  Clearly instructed about warning signs. F/U if symptoms persist.  -     Urine culture -     nitrofurantoin, macrocrystal-monohydrate, (MACROBID) 100 MG capsule; Take 1 capsule (100 mg total) by mouth 2 (two) times daily for 5 days.  Abnormal CBC -     CBC with Differential/Platelet   Hyperlipemia She will continue Crestor 10 mg daily. Continue low-fat diet. Further recommendation will be given according to lipid panel results. Follow-up in 6 to 12 months depending on lab results.  Type 2 diabetes mellitus without complication, without long-term current use of insulin ([place]) HgA1C has been at goal, lab is pending today. No changes in current management. Regular exercise and healthy diet with avoidance of added sugar food intake is an important part of treatment and recommended. Annual eye exam, she is overdue. Periodic dental and foot care also recommended. F/U in 5-6 months   Essential hypertension Adequately controlled. No changes in Cozaar 100 mg or metoprolol titrate 25 mg twice daily. Continue low-salt  diet. Follow-up in 6 months, before if needed.      Return in 6 months (on 12/26/2018) for DM II,wt,.     Betty G. Martinique, MD  [facility]. [place] office.

## 2018-06-27 ENCOUNTER — Ambulatory Visit (INDEPENDENT_AMBULATORY_CARE_PROVIDER_SITE_OTHER): Payer: 59 | Admitting: Family Medicine

## 2018-06-27 ENCOUNTER — Encounter: Payer: Self-pay | Admitting: Family Medicine

## 2018-06-27 VITALS — BP 124/80 | HR 74 | Temp 98.2°F | Resp 12 | Ht 60.0 in | Wt 245.2 lb

## 2018-06-27 DIAGNOSIS — R7989 Other specified abnormal findings of blood chemistry: Secondary | ICD-10-CM | POA: Diagnosis not present

## 2018-06-27 DIAGNOSIS — Z0000 Encounter for general adult medical examination without abnormal findings: Secondary | ICD-10-CM | POA: Diagnosis not present

## 2018-06-27 DIAGNOSIS — I10 Essential (primary) hypertension: Secondary | ICD-10-CM

## 2018-06-27 DIAGNOSIS — E782 Mixed hyperlipidemia: Secondary | ICD-10-CM | POA: Diagnosis not present

## 2018-06-27 DIAGNOSIS — N390 Urinary tract infection, site not specified: Secondary | ICD-10-CM

## 2018-06-27 DIAGNOSIS — E119 Type 2 diabetes mellitus without complications: Secondary | ICD-10-CM | POA: Diagnosis not present

## 2018-06-27 DIAGNOSIS — R350 Frequency of micturition: Secondary | ICD-10-CM | POA: Diagnosis not present

## 2018-06-27 LAB — BASIC METABOLIC PANEL
BUN: 8 mg/dL (ref 6–23)
CHLORIDE: 100 meq/L (ref 96–112)
CO2: 24 mEq/L (ref 19–32)
CREATININE: 0.68 mg/dL (ref 0.40–1.20)
Calcium: 9.4 mg/dL (ref 8.4–10.5)
GFR: 100.76 mL/min (ref >60.00)
Glucose, Bld: 94 mg/dL (ref 70–99)
Potassium: 3.9 mEq/L (ref 3.5–5.1)
Sodium: 136 mEq/L (ref 135–145)

## 2018-06-27 LAB — CBC WITH DIFFERENTIAL/PLATELET
BASOS PCT: 0.5 % (ref 0.0–3.0)
Basophils Absolute: 0.1 10*3/uL (ref 0.0–0.1)
EOS PCT: 2.2 % (ref 0.0–5.0)
Eosinophils Absolute: 0.3 10*3/uL (ref 0.0–0.7)
HEMATOCRIT: 38 % (ref 36.0–46.0)
Hemoglobin: 12.7 g/dL (ref 12.0–15.0)
LYMPHS ABS: 3.3 10*3/uL (ref 0.7–4.0)
LYMPHS PCT: 27 % (ref 12.0–46.0)
MCHC: 33.4 g/dL (ref 30.0–36.0)
MCV: 80.5 fl (ref 78.0–100.0)
MONOS PCT: 6.4 % (ref 3.0–12.0)
Monocytes Absolute: 0.8 10*3/uL (ref 0.1–1.0)
NEUTROS ABS: 7.9 10*3/uL — AB (ref 1.4–7.7)
NEUTROS PCT: 63.9 % (ref 43.0–77.0)
PLATELETS: 470 10*3/uL — AB (ref 150.0–400.0)
RBC: 4.72 Mil/uL (ref 3.87–5.11)
RDW: 15.2 % (ref 11.5–15.5)
WBC: 12.3 10*3/uL — ABNORMAL HIGH (ref 4.0–10.5)

## 2018-06-27 LAB — POC URINALSYSI DIPSTICK (AUTOMATED)
BILIRUBIN UA: NEGATIVE
GLUCOSE UA: NEGATIVE
KETONES UA: NEGATIVE
LEUKOCYTES UA: NEGATIVE
NITRITE UA: NEGATIVE
Protein, UA: POSITIVE — AB
Spec Grav, UA: >=1.03 — AB (ref 1.010–1.025)
Urobilinogen, UA: 0.2 E.U./dL
pH, UA: 5.5 (ref 5.0–8.0)

## 2018-06-27 LAB — HEMOGLOBIN A1C: Hgb A1c MFr Bld: 6.1 % (ref 4.6–6.5)

## 2018-06-27 LAB — LIPID PANEL
CHOLESTEROL: 201 mg/dL — AB (ref 0–200)
HDL: 36.3 mg/dL — ABNORMAL LOW (ref >39.00)
Total CHOL/HDL Ratio: 6

## 2018-06-27 LAB — LDL CHOLESTEROL, DIRECT: Direct LDL: 100 mg/dL

## 2018-06-27 MED ORDER — NITROFURANTOIN MONOHYD MACRO 100 MG PO CAPS: 100.0000 mg | ORAL_CAPSULE | Freq: Two times a day (BID) | ORAL | 0 refills | Status: AC

## 2018-06-27 NOTE — Assessment & Plan Note (Signed)
Adequately controlled. No changes in Cozaar 100 mg or metoprolol titrate 25 mg twice daily. Continue low-salt diet. Follow-up in 6 months, before if needed.

## 2018-06-27 NOTE — Patient Instructions (Addendum)
A few things to remember from today's visit:   Routine general medical examination at a health care facility  Type 2 diabetes mellitus without complication, without long-term current use of insulin ([place]) - Plan: Basic metabolic panel, Hemoglobin A1c  Essential hypertension - Plan: Basic metabolic panel  Mixed hyperlipidemia - Plan: Lipid panel  Urinary frequency - Plan: POCT Urinalysis Dipstick (Automated)  Urinary tract infection without hematuria, site unspecified - Plan: Urine culture, nitrofurantoin, macrocrystal-monohydrate, (MACROBID) 100 MG capsule  Abnormal CBC - Plan: CBC with Differential/Platelet  Today you have you routine preventive visit.  At least 150 minutes of moderate exercise per week, daily brisk walking for 15-30 min is a good exercise option. Healthy diet low in saturated (animal) fats and sweets and consisting of fresh fruits and vegetables, lean meats such as fish and white chicken and whole grains.  These are some of recommendations for screening depending of age and risk factors:   - Vaccines:  Tdap vaccine every 10 years.  Shingles vaccine recommended at age 26, could be given after 42 years of age but not sure about insurance coverage.   Pneumonia vaccines:  Prevnar 13 at 65 and Pneumovax at 57. Sometimes Pneumovax is giving earlier if history of smoking, lung disease,diabetes,kidney disease among some.   Cervical cancer prevention:  Pap smear starts at 42 years of age and continues periodically until 42 years old in low risk women. Pap smear every 3 years between 54 and 27 years old. Pap smear every 3-5 years between women 55 and older if pap smear negative and HPV screening negative.   -Breast cancer: Mammogram: There is disagreement between experts about when to start screening in low risk asymptomatic female but recent recommendations are to start screening at 23 and not later than 42 years old , every 1-2 years and after 42 yo q 2 years.  Screening is recommended until 42 years old but some women can continue screening depending of healthy issues.   Colon cancer screening: starts at 42 years old until 42 years old.   Also recommended:  1. Dental visit- Brush and floss your teeth twice daily; visit your dentist twice a year. 2. Eye doctor- Get an eye exam at least every 2 years. 3. Helmet use- Always wear a helmet when riding a bicycle, motorcycle, rollerblading or skateboarding. 4. Safe sex- If you may be exposed to sexually transmitted infections, use a condom. 5. Seat belts- Seat belts can save your live; always wear one. 6. Smoke/Carbon Monoxide detectors- These detectors need to be installed on the appropriate level of your home. Replace batteries at least once a year. 7. Skin cancer- When out in the sun please cover up and use sunscreen 15 SPF or higher. 8. Violence- If anyone is threatening or hurting you, please tell your healthcare provider.  9. Drink alcohol in moderation- Limit alcohol intake to one drink or less per day. Never drink and drive.   Please be sure medication list is accurate. If a new problem present, please set up appointment sooner than planned today.

## 2018-06-27 NOTE — Assessment & Plan Note (Signed)
HgA1C has been at goal, lab is pending today. No changes in current management. Regular exercise and healthy diet with avoidance of added sugar food intake is an important part of treatment and recommended. Annual eye exam, she is overdue. Periodic dental and foot care also recommended. F/U in 5-6 months

## 2018-06-27 NOTE — Assessment & Plan Note (Signed)
She will continue Crestor 10 mg daily. Continue low-fat diet. Further recommendation will be given according to lipid panel results. Follow-up in 6 to 12 months depending on lab results.

## 2018-06-28 LAB — URINE CULTURE
MICRO NUMBER: 91049280
SPECIMEN QUALITY:: ADEQUATE

## 2018-06-30 ENCOUNTER — Encounter: Payer: Self-pay | Admitting: Family Medicine

## 2018-07-03 ENCOUNTER — Telehealth: Payer: Self-pay | Admitting: Cardiology

## 2018-07-03 NOTE — Telephone Encounter (Signed)
Fine with me

## 2018-07-03 NOTE — Telephone Encounter (Signed)
New Message    Patient is calling because she wants to switch providers. She wants to go from Dr. Geraldo Pitter to Dr. Charlott Holler. Please advise.

## 2018-07-12 ENCOUNTER — Other Ambulatory Visit: Payer: Self-pay | Admitting: Family Medicine

## 2018-11-22 NOTE — Telephone Encounter (Signed)
yes

## 2018-12-26 ENCOUNTER — Encounter: Payer: Self-pay | Admitting: Family Medicine

## 2018-12-26 ENCOUNTER — Ambulatory Visit (INDEPENDENT_AMBULATORY_CARE_PROVIDER_SITE_OTHER): Payer: No Typology Code available for payment source | Admitting: Family Medicine

## 2018-12-26 ENCOUNTER — Other Ambulatory Visit: Payer: Self-pay | Admitting: Family Medicine

## 2018-12-26 VITALS — BP 120/78 | HR 72 | Temp 97.8°F | Resp 12 | Ht 60.0 in | Wt 245.5 lb

## 2018-12-26 DIAGNOSIS — E782 Mixed hyperlipidemia: Secondary | ICD-10-CM

## 2018-12-26 DIAGNOSIS — I10 Essential (primary) hypertension: Secondary | ICD-10-CM

## 2018-12-26 DIAGNOSIS — E119 Type 2 diabetes mellitus without complications: Secondary | ICD-10-CM

## 2018-12-26 DIAGNOSIS — Z6841 Body Mass Index (BMI) 40.0 and over, adult: Secondary | ICD-10-CM

## 2018-12-26 DIAGNOSIS — L989 Disorder of the skin and subcutaneous tissue, unspecified: Secondary | ICD-10-CM

## 2018-12-26 LAB — LIPID PANEL
CHOL/HDL RATIO: 3
CHOLESTEROL: 136 mg/dL (ref 0–200)
HDL: 41.5 mg/dL (ref >39.00)
NonHDL: 94.56
TRIGLYCERIDES: 367 mg/dL — AB (ref 0.0–149.0)
VLDL: 73.4 mg/dL — AB (ref 0.0–40.0)

## 2018-12-26 LAB — COMPREHENSIVE METABOLIC PANEL
ALK PHOS: 82 U/L (ref 39–117)
ALT: 19 U/L (ref 0–35)
AST: 17 U/L (ref 0–37)
Albumin: 4.4 g/dL (ref 3.5–5.2)
BUN: 9 mg/dL (ref 6–23)
CHLORIDE: 104 meq/L (ref 96–112)
CO2: 22 meq/L (ref 19–32)
Calcium: 9.7 mg/dL (ref 8.4–10.5)
Creatinine, Ser: 0.75 mg/dL (ref 0.40–1.20)
GFR: 84.46 mL/min (ref >60.00)
Glucose, Bld: 93 mg/dL (ref 70–99)
POTASSIUM: 4 meq/L (ref 3.5–5.1)
Sodium: 139 mEq/L (ref 135–145)
Total Bilirubin: 0.4 mg/dL (ref 0.2–1.2)
Total Protein: 6.6 g/dL (ref 6.0–8.3)

## 2018-12-26 LAB — LDL CHOLESTEROL, DIRECT: Direct LDL: 54 mg/dL

## 2018-12-26 NOTE — Assessment & Plan Note (Signed)
Problem has been stable. No changes in metformin 1000 mg twice daily.  Regular exercise and healthy diet with avoidance of added sugar food intake is an important part of treatment and recommended. Annual eye exam, periodic dental and foot care recommended. F/U in 5-6 months

## 2018-12-26 NOTE — Assessment & Plan Note (Signed)
BP adequately controlled. Eye exam is current. Continue low-salt diet. No changes in current management.

## 2018-12-26 NOTE — Patient Instructions (Signed)
A few things to remember from today's visit:   Mixed hyperlipidemia - Plan: Comprehensive metabolic panel, Lipid panel  Type 2 diabetes mellitus without complication, without long-term current use of insulin ([place]) - Plan: Comprehensive metabolic panel  No changes in meds today. Continue working on wt loss.  Please be sure medication list is accurate. If a new problem present, please set up appointment sooner than planned today.

## 2018-12-26 NOTE — Progress Notes (Signed)
HPI:   [name] is a 43 y.o. female, who is here today for 6 months follow up.   She was last seen on 06/27/18.  HTN on Losartan 100 mg aily and amlodipine 5 mg daily. Not checking BP. Denies severe/frequent headache, visual changes, chest pain, dyspnea, palpitation, claudication, focal weakness, or edema.  DM II: She is on Metformin 1000 mg bid.  Lab Results  Component Value Date   HGBA1C 6.1 06/27/2018   Denies abdominal pain, nausea,vomiting, polydipsia,polyuria, or polyphagia. BS's in low 110's   HLD,currently on Crestor 10 mg daily. Tolerating medication well. Trying to follow low fat diet.  Lab Results  Component Value Date   CHOL 201 (H) 06/27/2018   HDL 36.30 (L) 06/27/2018   LDLDIRECT 100.0 06/27/2018   TRIG (H) 06/27/2018    443.0 Triglyceride is over 400; calculations on Lipids are invalid.   CHOLHDL 6 06/27/2018    Decreased carb intake. She is not exercising regularly.   Today she is concerned about "lump" she noted in right foot.  She is not sure for how long this has been going on. It is not tender,no skin changes,and no limitation of movement.   Review of Systems  Constitutional: Negative for activity change, appetite change, fatigue and fever.  HENT: Negative for mouth sores, nosebleeds and trouble swallowing.   Eyes: Negative for redness and visual disturbance.  Respiratory: Negative for cough, shortness of breath and wheezing.   Cardiovascular: Negative for chest pain, palpitations and leg swelling.  Gastrointestinal: Negative for abdominal pain, nausea and vomiting.       Negative for changes in bowel habits.  Endocrine: Negative for polydipsia, polyphagia and polyuria.  Genitourinary: Negative for decreased urine volume, dysuria and hematuria.  Musculoskeletal: Negative for gait problem and myalgias.  Skin: Negative for rash and wound.  Neurological: Negative for syncope, weakness, numbness and headaches.    Psychiatric/Behavioral: Negative for confusion. The patient is nervous/anxious.      Current Outpatient Medications on File Prior to Visit  Medication Sig Dispense Refill  . amLODipine (NORVASC) 5 MG tablet TAKE 1 TABLET BY MOUTH  DAILY 90 tablet 1  . buPROPion (WELLBUTRIN SR) 150 MG 12 hr tablet Take 1 tablet (150 mg total) by mouth daily. 30 tablet 1  . ergocalciferol (VITAMIN D2) 1.25 MG (50000 UT) capsule Take 50,000 Units by mouth once a week.    Marland Kitchen glucose blood test strip Use to test blood sugar once daily. 100 each 2  . metFORMIN (GLUCOPHAGE) 1000 MG tablet TAKE 1 TABLET(1000 MG) BY MOUTH TWICE DAILY WITH A MEAL 180 tablet 3  . metoprolol tartrate (LOPRESSOR) 25 MG tablet TAKE 1 TABLET(25 MG) BY MOUTH TWICE DAILY 180 tablet 2  . norethindrone (MICRONOR,CAMILA,ERRIN) 0.35 MG tablet Take by mouth.    Glory Rosebush DELICA LANCETS 65K MISC Use to test blood sugar once daily. 100 each 2  . rosuvastatin (CRESTOR) 10 MG tablet TAKE 1 TABLET BY MOUTH  DAILY 90 tablet 4  . sertraline (ZOLOFT) 100 MG tablet Take 2 tablets (200 mg total) by mouth daily. (Patient taking differently: Take 150 mg by mouth daily. ) 180 tablet 1  . traZODone (DESYREL) 150 MG tablet TAKE 1 TABLET BY MOUTH  EVERY NIGHT AT BEDTIME AS  NEEDED 90 tablet 0   No current facility-administered medications on file prior to visit.      Past Medical History:  Diagnosis Date  . Anxiety   . Carpal tunnel syndrome of  left wrist 11/2015  . Chronic low back pain   . Cough 12/08/2015  . Depression   . History of kidney stones   . Hyperlipemia 12/02/2015  . Hypertension    states under control with med., has been on med. < 9 mos.  . Migraines   . Non-insulin dependent type 2 diabetes mellitus ([place])   . Polycystic ovarian syndrome   . Renal disorder   . Tension headache, chronic    No Known Allergies  Social History   Socioeconomic History  . Marital status: Married    Spouse name: Not on file  . Number of children: 1   . Years of education: Not on file  . Highest education level: Not on file  Occupational History  . Occupation: Data entry    Employer: [employer]  . Financial resource strain: Not on file  . Food insecurity:    Worry: Not on file    Inability: Not on file  . Transportation needs:    Medical: Not on file    Non-medical: Not on file  Tobacco Use  . Smoking status: Former Smoker    Packs/day: 0.00    Last attempt to quit: 09/07/1998    Years since quitting: 20.3  . Smokeless tobacco: Never Used  Substance and Sexual Activity  . Alcohol use: Yes    Comment: occasionally  . Drug use: Yes    Frequency: 2.0 times per week    Types: Marijuana  . Sexual activity: Yes    Partners: Male    Birth control/protection: Pill    Comment: microgestin  Lifestyle  . Physical activity:    Days per week: Not on file    Minutes per session: Not on file  . Stress: Not on file  Relationships  . Social connections:    Talks on phone: Not on file    Gets together: Not on file    Attends religious service: Not on file    Active member of club or organization: Not on file    Attends meetings of clubs or organizations: Not on file    Relationship status: Not on file  Other Topics Concern  . Not on file  Social History Narrative   Works in customer service   Married    Vitals:   12/26/18 0850  BP: 120/78  Pulse: 72  Resp: 12  Temp: 97.8 F (36.6 C)  SpO2: 99%   Body mass index is 47.95 kg/m.  Wt Readings from Last 3 Encounters:  12/26/18 245 lb 8 oz (111.4 kg)  06/27/18 245 lb 4 oz (111.2 kg)  03/28/18 254 lb (115.2 kg)    Physical Exam  Nursing note and vitals reviewed. Constitutional: She is oriented to person, place, and time. She appears well-developed. No distress.  HENT:  Head: Normocephalic and atraumatic.  Mouth/Throat: Oropharynx is clear and moist and mucous membranes are normal.  Eyes: Pupils are equal, round, and reactive to light.  Conjunctivae are normal.  Cardiovascular: Normal rate and regular rhythm.  No murmur heard. Pulses:      Dorsalis pedis pulses are 2+ on the right side and 2+ on the left side.  Respiratory: Effort normal and breath sounds normal. No respiratory distress.  GI: Soft. She exhibits no mass. There is no hepatomegaly. There is no abdominal tenderness.  Musculoskeletal:        General: No edema.       Feet:     Comments: Prominent round area, about  2.5 to 3 cm diameter, soft, no tenderness, not clear definition.  There is callus overlapping area.  Lymphadenopathy:    She has no cervical adenopathy.  Neurological: She is alert and oriented to person, place, and time. She has normal strength. No cranial nerve deficit. Gait normal.  Skin: Skin is warm. No rash noted. No erythema.  Psychiatric: She has a normal mood and affect.  Well groomed, good eye contact.   Diabetic Foot Exam - Simple   Simple Foot Form Diabetic Foot exam was performed with the following findings:  Yes 12/26/2018  9:08 AM  Visual Inspection See comments:  Yes Sensation Testing Intact to touch and monofilament testing bilaterally:  Yes Pulse Check Posterior Tibialis and Dorsalis pulse intact bilaterally:  Yes Comments Plantar callus, bilateral.       ASSESSMENT AND PLAN:   Ms. VIKKIE GOEDEN was seen today for 6 months follow-up.  Orders Placed This Encounter  Procedures  . Comprehensive metabolic panel  . Lipid panel  . LDL cholesterol, direct   Lab Results  Component Value Date   CHOL 136 12/26/2018   HDL 41.50 12/26/2018   LDLDIRECT 54.0 12/26/2018   TRIG 367.0 (H) 12/26/2018   CHOLHDL 3 12/26/2018   Lab Results  Component Value Date   ALT 19 12/26/2018   AST 17 12/26/2018   ALKPHOS 82 12/26/2018   BILITOT 0.4 12/26/2018   Lab Results  Component Value Date   CREATININE 0.75 12/26/2018   BUN 9 12/26/2018   NA 139 12/26/2018   K 4.0 12/26/2018   CL 104 12/26/2018   CO2 22 12/26/2018     Morbid obesity with BMI of 45.0-49.9, adult ([place]) Weight otherwise stable.  Since her last visit.  Consistency with healthy diet and physical activity recommended.   Hyperlipemia No changes in Crestor 20 mg daily will follow labs done today and will give further recommendations accordingly. Continue working on low-fat diet.  Essential hypertension BP adequately controlled. Eye exam is current. Continue low-salt diet. No changes in current management.  Type 2 diabetes mellitus without complication, without long-term current use of insulin (HCC) Problem has been stable. No changes in metformin 1000 mg twice daily.  Regular exercise and healthy diet with avoidance of added sugar food intake is an important part of treatment and recommended. Annual eye exam, periodic dental and foot care recommended. F/U in 5-6 months   Foot lesion Asymptomatic. ? Bone exostosis. She is not interested in imagine. She will monitor for changes or new symptoms.  Return in about 7 months (around 07/28/2019) for cpe.     Tikia Skilton G. [place], MD  [facility]. [place] office.

## 2018-12-26 NOTE — Assessment & Plan Note (Signed)
No changes in Crestor 20 mg daily will follow labs done today and will give further recommendations accordingly. Continue working on low-fat diet.

## 2018-12-26 NOTE — Assessment & Plan Note (Signed)
Weight otherwise stable.  Since her last visit.  Consistency with healthy diet and physical activity recommended.

## 2018-12-29 ENCOUNTER — Other Ambulatory Visit: Payer: Self-pay | Admitting: Family Medicine

## 2018-12-31 ENCOUNTER — Encounter: Payer: Self-pay | Admitting: Family Medicine

## 2018-12-31 MED ORDER — ROSUVASTATIN CALCIUM 20 MG PO TABS: 20.0000 mg | ORAL_TABLET | Freq: Every day | ORAL | 2 refills | Status: DC

## 2019-02-01 ENCOUNTER — Encounter (HOSPITAL_COMMUNITY): Payer: Self-pay | Admitting: Psychology

## 2019-02-01 DIAGNOSIS — F331 Major depressive disorder, recurrent, moderate: Secondary | ICD-10-CM

## 2019-02-01 NOTE — Progress Notes (Signed)
[name] is a 44 y.o. female patient who is discharged from counseling as last seen on 02/28/18 and didn't return.  Outpatient Therapist Discharge Summary  LEXINE JASPERS    Dec 20, 1975   Admission Date: 01/19/18   Discharge Date:  02/01/19 Reason for Discharge:  Didn't return Diagnosis:    Moderate episode of recurrent major depressive disorder ([place]) at last session    Comments:  Pt may return as needed in future  [facility], [facility]

## 2019-02-20 ENCOUNTER — Other Ambulatory Visit: Payer: Self-pay | Admitting: *Deleted

## 2019-02-20 DIAGNOSIS — R002 Palpitations: Secondary | ICD-10-CM

## 2019-02-20 MED ORDER — METOPROLOL TARTRATE 25 MG PO TABS: ORAL_TABLET | ORAL | 2 refills | Status: DC

## 2019-03-25 ENCOUNTER — Other Ambulatory Visit: Payer: Self-pay | Admitting: Family Medicine

## 2019-03-25 DIAGNOSIS — R002 Palpitations: Secondary | ICD-10-CM

## 2019-05-20 ENCOUNTER — Other Ambulatory Visit: Payer: Self-pay | Admitting: Family Medicine

## 2019-05-20 DIAGNOSIS — E119 Type 2 diabetes mellitus without complications: Secondary | ICD-10-CM

## 2019-06-17 ENCOUNTER — Other Ambulatory Visit: Payer: Self-pay | Admitting: Family Medicine

## 2019-06-23 ENCOUNTER — Other Ambulatory Visit: Payer: Self-pay | Admitting: Family Medicine

## 2019-06-26 ENCOUNTER — Telehealth: Payer: Self-pay | Admitting: Family Medicine

## 2019-06-26 NOTE — Telephone Encounter (Signed)
LMVM for the patient to contact the office to reschedule her appointment due to the provider being out of the office.  I will also try and contact the patient through [place].

## 2019-06-29 ENCOUNTER — Encounter: Payer: No Typology Code available for payment source | Admitting: Family Medicine

## 2019-07-11 ENCOUNTER — Other Ambulatory Visit: Payer: Self-pay

## 2019-07-11 ENCOUNTER — Ambulatory Visit (INDEPENDENT_AMBULATORY_CARE_PROVIDER_SITE_OTHER): Payer: No Typology Code available for payment source | Admitting: Family Medicine

## 2019-07-11 ENCOUNTER — Encounter: Payer: Self-pay | Admitting: Family Medicine

## 2019-07-11 DIAGNOSIS — R350 Frequency of micturition: Secondary | ICD-10-CM | POA: Diagnosis not present

## 2019-07-11 DIAGNOSIS — N200 Calculus of kidney: Secondary | ICD-10-CM | POA: Diagnosis not present

## 2019-07-11 DIAGNOSIS — Z515 Encounter for palliative care: Secondary | ICD-10-CM | POA: Insufficient documentation

## 2019-07-11 MED ORDER — HYDROCODONE-ACETAMINOPHEN 5-325 MG PO TABS: 1.0000 | ORAL_TABLET | Freq: Three times a day (TID) | ORAL | 0 refills | Status: AC | PRN

## 2019-07-11 NOTE — Progress Notes (Signed)
Virtual Visit via Video Note  I connected with Patricia Halberg on 07/11/19 by a video enabled telemedicine application and verified that I am speaking with the correct person using two identifiers.  Location patient: home Location provider:work office Persons participating in the virtual visit: patient, provider  I discussed the limitations of evaluation and management by telemedicine and the availability of in person appointments. The patient expressed understanding and agreed to proceed.   HPI: [name] is a 43 yo female with Hx of DM II,depression,and HTN c/o severe suprapubic pain.  2 days of intermittent suprapubic achy/dull pain with episodes of sharp pain. Pain is not radiated She is attributing symptoms to a kidney stone. She has not identified exacerbating or alleviating factors.  She denies having fever,chills,fatigue,N/V,or changes in bowel habits.  Denies dysuria, gross hematuria,decreased urine output,vaginal bleeding,or discharge. Mild urinary frequency.  She has history of nephrolithiasis and acute episodes are similar to symptoms she is reporting at this time. She has not tried OTC medication.  She had flank pain 6 days ago but thought was related to menses now she thinks it may have been related with kidney stone. In the past she has been treated with Hydrocodone or Dilaudid.   ROS: See pertinent positives and negatives per HPI.  Past Medical History:  Diagnosis Date  . Anxiety   . Carpal tunnel syndrome of left wrist 11/2015  . Chronic low back pain   . Cough 12/08/2015  . Depression   . History of kidney stones   . Hyperlipemia 12/02/2015  . Hypertension    states under control with med., has been on med. < 9 mos.  . Migraines   . Non-insulin dependent type 2 diabetes mellitus ([place])   . Polycystic ovarian syndrome   . Renal disorder   . Tension headache, chronic     Past Surgical History:  Procedure Laterality Date  . CARPAL TUNNEL RELEASE Right  09/16/2015   Procedure: RIGHT CARPAL TUNNEL RELEASE;  Surgeon: Leanora Cover, MD;  Location: [place];  Service: Orthopedics;  Laterality: Right;  . CARPAL TUNNEL RELEASE Left 12/12/2015   Procedure: LEFT CARPAL TUNNEL RELEASE;  Surgeon: Leanora Cover, MD;  Location: [facility];  Service: Orthopedics;  Laterality: Left;  . CESAREAN SECTION  05/06/2007  . EXTRACORPOREAL SHOCK WAVE LITHOTRIPSY  11/22/2011  . LEEP N/A 03/17/2015   Procedure: LOOP ELECTROSURGICAL EXCISION PROCEDURE (LEEP) and Cold Knife [place];  Surgeon: Eldred Manges, MD;  Location: [facility];  Service: Gynecology;  Laterality: N/A;  . WISDOM TOOTH EXTRACTION      Family History  Problem Relation Age of Onset  . Arthritis Mother   . Breast cancer Paternal Aunt   . Depression Paternal Aunt   . Mental illness Father   . Melanoma Father   . Bipolar disorder Father     Social History   Socioeconomic History  . Marital status: Married    Spouse name: Not on file  . Number of children: 1  . Years of education: Not on file  . Highest education level: Not on file  Occupational History  . Occupation: Data entry    Employer: [employer]  . Financial resource strain: Not on file  . Food insecurity    Worry: Not on file    Inability: Not on file  . Transportation needs    Medical: Not on file    Non-medical: Not on file  Tobacco Use  . Smoking status: Former Smoker  Packs/day: 0.00    Quit date: 09/07/1998    Years since quitting: 20.8  . Smokeless tobacco: Never Used  Substance and Sexual Activity  . Alcohol use: Yes    Comment: occasionally  . Drug use: Yes    Frequency: 2.0 times per week    Types: Marijuana  . Sexual activity: Yes    Partners: Male    Birth control/protection: Pill    Comment: microgestin  Lifestyle  . Physical activity    Days per week: Not on file    Minutes per session: Not on file  . Stress: Not on file  Relationships  . Social  Herbalist on phone: Not on file    Gets together: Not on file    Attends religious service: Not on file    Active member of club or organization: Not on file    Attends meetings of clubs or organizations: Not on file    Relationship status: Not on file  . Intimate partner violence    Fear of current or ex partner: Not on file    Emotionally abused: Not on file    Physically abused: Not on file    Forced sexual activity: Not on file  Other Topics Concern  . Not on file  Social History Narrative   Works in Therapist, art   Married    Current Outpatient Medications:  .  amLODipine (NORVASC) 5 MG tablet, TAKE 1 TABLET BY MOUTH  DAILY, Disp: 90 tablet, Rfl: 3 .  buPROPion (WELLBUTRIN SR) 150 MG 12 hr tablet, Take 1 tablet (150 mg total) by mouth daily., Disp: 30 tablet, Rfl: 1 .  ergocalciferol (VITAMIN D2) 1.25 MG (50000 UT) capsule, Take 50,000 Units by mouth once a week., Disp: , Rfl:  .  glucose blood test strip, Use to test blood sugar once daily., Disp: 100 each, Rfl: 2 .  HYDROcodone-acetaminophen (NORCO/VICODIN) 5-325 MG tablet, Take 1 tablet by mouth every 8 (eight) hours as needed for up to 3 days for moderate pain., Disp: 9 tablet, Rfl: 0 .  losartan (COZAAR) 100 MG tablet, TAKE 1 TABLET BY MOUTH  DAILY, Disp: 90 tablet, Rfl: 3 .  metFORMIN (GLUCOPHAGE) 1000 MG tablet, TAKE 1 TABLET(1000 MG) BY MOUTH TWICE DAILY WITH A MEAL, Disp: 180 tablet, Rfl: 3 .  metFORMIN (GLUCOPHAGE) 500 MG tablet, TAKE 1 TABLET BY MOUTH TWO  TIMES DAILY WITH MEALS, Disp: 180 tablet, Rfl: 2 .  metoprolol tartrate (LOPRESSOR) 25 MG tablet, TAKE 1 TABLET(25 MG) BY MOUTH TWICE DAILY, Disp: 180 tablet, Rfl: 2 .  norethindrone (MICRONOR,CAMILA,ERRIN) 0.35 MG tablet, Take by mouth., Disp: , Rfl:  .  ONETOUCH DELICA LANCETS 59D MISC, Use to test blood sugar once daily., Disp: 100 each, Rfl: 2 .  rosuvastatin (CRESTOR) 20 MG tablet, Take 1 tablet (20 mg total) by mouth daily., Disp: 90 tablet, Rfl:  2 .  sertraline (ZOLOFT) 100 MG tablet, Take 2 tablets (200 mg total) by mouth daily. (Patient taking differently: Take 150 mg by mouth daily. ), Disp: 180 tablet, Rfl: 1 .  traZODone (DESYREL) 150 MG tablet, TAKE 1 TABLET BY MOUTH  EVERY NIGHT AT BEDTIME AS  NEEDED, Disp: 90 tablet, Rfl: 0  EXAM:  VITALS per patient if applicable:N/A  GENERAL: alert, oriented, appears well and in no acute distress  HEENT: atraumatic, conjunctiva clear, no obvious abnormalities on inspection of external nose and ears  LUNGS: on inspection no signs of respiratory distress, breathing rate appears normal, no  obvious gross SOB, gasping or wheezing  CV: no obvious cyanosis  PSYCH/NEURO: pleasant and cooperative, no obvious depression or anxiety, speech and thought processing grossly intact  ASSESSMENT AND PLAN:  Discussed the following assessment and plan:  Urinary frequency  Nephrolithiasis - Plan: HYDROcodone-acetaminophen (NORCO/VICODIN) 5-325 MG tablet  We discussed other possible etiologies for urinary frequency and suprapubic abdominal pain. This is not the typical renal colic but she is reporting similar symptoms with previous episodes; so will treat as a such. Hydrocodone-Acetaminophen side effects discussed. Increase fluid intake and monitor for fever. Instructed about warning signs.       I discussed the assessment and treatment plan with the patient. The patientwas provided an opportunity to ask questions and all were answered. The patient agreed with the plan and demonstrated an understanding of the instructions.   The patient was advised to call back or seek an in-person evaluation if the symptoms worsen or if the condition fails to improve as anticipated.  Return if symptoms worsen or fail to improve.    Ereka Brau [place], MD

## 2019-07-16 ENCOUNTER — Encounter: Payer: No Typology Code available for payment source | Admitting: Family Medicine

## 2019-08-10 ENCOUNTER — Encounter: Payer: No Typology Code available for payment source | Admitting: Family Medicine

## 2019-08-13 ENCOUNTER — Other Ambulatory Visit: Payer: Self-pay | Admitting: Family Medicine

## 2019-08-13 DIAGNOSIS — E782 Mixed hyperlipidemia: Secondary | ICD-10-CM

## 2019-08-14 ENCOUNTER — Ambulatory Visit (INDEPENDENT_AMBULATORY_CARE_PROVIDER_SITE_OTHER): Payer: No Typology Code available for payment source | Admitting: Family Medicine

## 2019-08-14 ENCOUNTER — Encounter: Payer: Self-pay | Admitting: Family Medicine

## 2019-08-14 ENCOUNTER — Other Ambulatory Visit: Payer: Self-pay

## 2019-08-14 VITALS — BP 130/78 | HR 91 | Temp 97.6°F | Resp 16 | Ht 60.0 in | Wt 244.8 lb

## 2019-08-14 DIAGNOSIS — E559 Vitamin D deficiency, unspecified: Secondary | ICD-10-CM | POA: Diagnosis not present

## 2019-08-14 DIAGNOSIS — Z23 Encounter for immunization: Secondary | ICD-10-CM

## 2019-08-14 DIAGNOSIS — D72829 Elevated white blood cell count, unspecified: Secondary | ICD-10-CM

## 2019-08-14 DIAGNOSIS — E782 Mixed hyperlipidemia: Secondary | ICD-10-CM

## 2019-08-14 DIAGNOSIS — E119 Type 2 diabetes mellitus without complications: Secondary | ICD-10-CM

## 2019-08-14 DIAGNOSIS — I10 Essential (primary) hypertension: Secondary | ICD-10-CM | POA: Diagnosis not present

## 2019-08-14 DIAGNOSIS — R002 Palpitations: Secondary | ICD-10-CM

## 2019-08-14 DIAGNOSIS — Z0000 Encounter for general adult medical examination without abnormal findings: Secondary | ICD-10-CM

## 2019-08-14 LAB — CBC WITH DIFFERENTIAL/PLATELET
Basophils Absolute: 0.1 10*3/uL (ref 0.0–0.1)
Basophils Relative: 0.8 % (ref 0.0–3.0)
Eosinophils Absolute: 1 10*3/uL — ABNORMAL HIGH (ref 0.0–0.7)
Eosinophils Relative: 7 % — ABNORMAL HIGH (ref 0.0–5.0)
HCT: 38.6 % (ref 36.0–46.0)
Hemoglobin: 12.7 g/dL (ref 12.0–15.0)
Lymphocytes Relative: 30.2 % (ref 12.0–46.0)
Lymphs Abs: 4.2 10*3/uL — ABNORMAL HIGH (ref 0.7–4.0)
MCHC: 32.9 g/dL (ref 30.0–36.0)
MCV: 83 fl (ref 78.0–100.0)
Monocytes Absolute: 0.8 10*3/uL (ref 0.1–1.0)
Monocytes Relative: 5.5 % (ref 3.0–12.0)
Neutro Abs: 7.9 10*3/uL — ABNORMAL HIGH (ref 1.4–7.7)
Neutrophils Relative %: 56.5 % (ref 43.0–77.0)
Platelets: 439 10*3/uL — ABNORMAL HIGH (ref 150.0–400.0)
RBC: 4.66 Mil/uL (ref 3.87–5.11)
RDW: 14.5 % (ref 11.5–15.5)
WBC: 14 10*3/uL — ABNORMAL HIGH (ref 4.0–10.5)

## 2019-08-14 LAB — BASIC METABOLIC PANEL
BUN: 13 mg/dL (ref 6–23)
CO2: 26 mEq/L (ref 19–32)
Calcium: 9.4 mg/dL (ref 8.4–10.5)
Chloride: 103 mEq/L (ref 96–112)
Creatinine, Ser: 0.7 mg/dL (ref 0.40–1.20)
GFR: 91.19 mL/min (ref >60.00)
Glucose, Bld: 107 mg/dL — ABNORMAL HIGH (ref 70–99)
Potassium: 4 mEq/L (ref 3.5–5.1)
Sodium: 138 mEq/L (ref 135–145)

## 2019-08-14 LAB — VITAMIN D 25 HYDROXY (VIT D DEFICIENCY, FRACTURES): VITD: 15.36 ng/mL — ABNORMAL LOW (ref 30.00–100.00)

## 2019-08-14 LAB — LDL CHOLESTEROL, DIRECT: Direct LDL: 37 mg/dL

## 2019-08-14 LAB — MICROALBUMIN / CREATININE URINE RATIO
Creatinine,U: 274.9 mg/dL
Microalb Creat Ratio: 1.5 mg/g (ref 0.0–30.0)
Microalb, Ur: 4 mg/dL — ABNORMAL HIGH (ref 0.0–1.9)

## 2019-08-14 LAB — LIPID PANEL
Cholesterol: 113 mg/dL (ref 0–200)
HDL: 38.4 mg/dL — ABNORMAL LOW (ref >39.00)
NonHDL: 74.23
Total CHOL/HDL Ratio: 3
Triglycerides: 279 mg/dL — ABNORMAL HIGH (ref 0.0–149.0)
VLDL: 55.8 mg/dL — ABNORMAL HIGH (ref 0.0–40.0)

## 2019-08-14 LAB — HEMOGLOBIN A1C: Hgb A1c MFr Bld: 6.6 % — ABNORMAL HIGH (ref 4.6–6.5)

## 2019-08-14 MED ORDER — METOPROLOL TARTRATE 25 MG PO TABS: ORAL_TABLET | ORAL | 2 refills | Status: DC

## 2019-08-14 NOTE — Progress Notes (Signed)
HPI:   [name] is a 43 y.o. female, who is here today for her routine physical.  Last CPE: 06/27/18. Regular exercise 3 or more time per week: Not consistently. Following a healthful diet: She has decreased meal portions She lives with her husband and daughter.  Chronic medical problems: DM II,HTN,PCOS,nephrolothiasis,abnormal pap skear,anxiety,and depression. She sees her psychiatrist every 3 months.  Pap smear: 07/24/2018. She sees her gyn regularly.  Immunization History  Administered Date(s) Administered  . Influenza,inj,Quad PF,6+ Mos 08/06/2015, 08/02/2016, 08/14/2019  . Influenza-Unspecified 08/06/2017  . Pneumococcal Polysaccharide-23 12/02/2015  . Tdap 03/28/2015    Mammogram: 06/2018.  She has some concerns today.  DM II,BS's low 100's. She is on Metformin 1000 mg bid. Denies polydipsia,polyuria, or polyphagia.   Lab Results  Component Value Date   HGBA1C 6.1 06/27/2018   Lab Results  Component Value Date   MICROALBUR 9.0 (H) 12/23/2017   HLD: She is on Crestor 20 mg daily. Tolerating medication well.  Lab Results  Component Value Date   CHOL 136 12/26/2018   HDL 41.50 12/26/2018   LDLDIRECT 54.0 12/26/2018   TRIG 367.0 (H) 12/26/2018   CHOLHDL 3 12/26/2018   C/O palpitations,which she has had intermittently for years. Started again last week, HR up to 120/min. She denies associated symptoms and not related to stress or exertion. Episodes last < than an hour. No bradycardia. Reports negative cardia work up 2 years ago, including stress test.  She has tried to decrease caffeine intake but has not noted  HTN: She is on Metoprolol Tartrate 25 mg bid.  Vit D def: She is not on vit D supplementation.  Elevated WBC's Negative for abnormal wt loss or night sweats.  Component     Latest Ref Rng & Units 06/27/2018  WBC     4.0 - 10.5 K/uL 12.3 (H)  RBC     3.87 - 5.11 Mil/uL 4.72  Hemoglobin     12.0 - 15.0 g/dL 12.7  HCT    36.0 - 46.0 % 38.0  MCV     78.0 - 100.0 fl 80.5  MCH     26.0 - 34.0 pg   MCHC     30.0 - 36.0 g/dL 33.4  RDW     11.5 - 15.5 % 15.2  Platelets     150.0 - 400.0 K/uL 470.0 (H)  Neutrophils     43.0 - 77.0 % 63.9  Lymphs     Not Estab. %   Monocytes     Not Estab. %   Eos     Not Estab. %   Basos     Not Estab. %   NEUT#     1.4 - 7.7 K/uL 7.9 (H)  Lymphocyte #     0.7 - 4.0 K/uL 3.3  Monocytes Absolute     0.1 - 0.9 x10E3/uL   EOS (ABSOLUTE)     0.0 - 0.4 x10E3/uL   Basophils Absolute     0.0 - 0.1 K/uL 0.1  Immature Granulocytes     Not Estab. %   Immature Grans (Abs)     0.0 - 0.1 x10E3/uL   lymph#     0.9 - 3.3 10e3/uL   MONO#     0.1 - 0.9 10e3/uL   Eosinophils Absolute     0.0 - 0.7 K/uL 0.3  NEUT%     38.4 - 76.8 %   LYMPH%     14.0 - 49.7 %  MONO%     0.0 - 14.0 %   EOS%     0.0 - 7.0 %   BASO%     0.0 - 2.0 %   Lymphocytes     12.0 - 46.0 % 27.0  Monocytes Relative     3.0 - 12.0 % 6.4  Monocyte #     0.1 - 1.0 K/uL 0.8  Eosinophil     0.0 - 5.0 % 2.2  Basophil     0.0 - 3.0 % 0.5        Review of Systems  Constitutional: Negative for appetite change, fatigue and fever.  HENT: Negative for dental problem, hearing loss, mouth sores, sore throat, trouble swallowing and voice change.   Eyes: Negative for redness and visual disturbance.  Respiratory: Negative for cough, shortness of breath and wheezing.   Cardiovascular: Negative for chest pain and leg swelling. + Palpitations. Gastrointestinal: Negative for abdominal pain, nausea and vomiting.       No changes in bowel habits.  Endocrine: Negative for cold intolerance, heat intolerance, polydipsia, polyphagia and polyuria.  Genitourinary: Negative for decreased urine volume, dysuria, hematuria, vaginal bleeding and vaginal discharge.  Musculoskeletal: Negative for arthralgias, gait problem and myalgias.  Skin: Negative for color change and rash.  Allergic/Immunologic: Negative  for environmental allergies.  Neurological: Negative for syncope, weakness and headaches.  Hematological: Negative for adenopathy. Does not bruise/bleed easily.  Psychiatric/Behavioral: Negative for confusion and sleep disturbance. The patient is not nervous/anxious.   All other systems reviewed and are negative.   Current Outpatient Medications on File Prior to Visit  Medication Sig Dispense Refill  . amLODipine (NORVASC) 5 MG tablet TAKE 1 TABLET BY MOUTH  DAILY 90 tablet 3  . atomoxetine (STRATTERA) 25 MG capsule atomoxetine 25 mg capsule  TK 1 C PO QAM    . atomoxetine (STRATTERA) 40 MG capsule atomoxetine 40 mg capsule  TK 1 C PO QAM    . glucose blood test strip Use to test blood sugar once daily. 100 each 2  . losartan (COZAAR) 100 MG tablet TAKE 1 TABLET BY MOUTH  DAILY 90 tablet 3  . metFORMIN (GLUCOPHAGE) 1000 MG tablet TAKE 1 TABLET(1000 MG) BY MOUTH TWICE DAILY WITH A MEAL 180 tablet 3  . norethindrone (MICRONOR,CAMILA,ERRIN) 0.35 MG tablet Take by mouth.    [name] DELICA LANCETS 40J MISC Use to test blood sugar once daily. 100 each 2  . sertraline (ZOLOFT) 100 MG tablet Take 2 tablets (200 mg total) by mouth daily. (Patient taking differently: Take 150 mg by mouth daily. ) 180 tablet 1  . traZODone (DESYREL) 150 MG tablet TAKE 1 TABLET BY MOUTH  EVERY NIGHT AT BEDTIME AS  NEEDED 90 tablet 0  . buPROPion (WELLBUTRIN SR) 150 MG 12 hr tablet Take 1 tablet (150 mg total) by mouth daily. 30 tablet 1  . ergocalciferol (VITAMIN D2) 1.25 MG (50000 UT) capsule Take 50,000 Units by mouth once a week.    . metFORMIN (GLUCOPHAGE) 500 MG tablet TAKE 1 TABLET BY MOUTH TWO  TIMES DAILY WITH MEALS 180 tablet 2  . rosuvastatin (CRESTOR) 20 MG tablet TAKE 1 TABLET BY MOUTH  DAILY 90 tablet 3   No current facility-administered medications on file prior to visit.      Past Medical History:  Diagnosis Date  . Anxiety   . Carpal tunnel syndrome of left wrist 11/2015  . Chronic low back  pain   . Cough 12/08/2015  . Depression   .  History of kidney stones   . Hyperlipemia 12/02/2015  . Hypertension    states under control with med., has been on med. < 9 mos.  . Migraines   . Non-insulin dependent type 2 diabetes mellitus ([place])   . Polycystic ovarian syndrome   . Renal disorder   . Tension headache, chronic     Past Surgical History:  Procedure Laterality Date  . CARPAL TUNNEL RELEASE Right 09/16/2015   Procedure: RIGHT CARPAL TUNNEL RELEASE;  Surgeon: Leanora Cover, MD;  Location: [facility];  Service: Orthopedics;  Laterality: Right;  . CARPAL TUNNEL RELEASE Left 12/12/2015   Procedure: LEFT CARPAL TUNNEL RELEASE;  Surgeon: Leanora Cover, MD;  Location: [facility];  Service: Orthopedics;  Laterality: Left;  . CESAREAN SECTION  05/06/2007  . EXTRACORPOREAL SHOCK WAVE LITHOTRIPSY  11/22/2011  . LEEP N/A 03/17/2015   Procedure: LOOP ELECTROSURGICAL EXCISION PROCEDURE (LEEP) and Cold Knife [place];  Surgeon: Eldred Manges, MD;  Location: [facility];  Service: Gynecology;  Laterality: N/A;  . WISDOM TOOTH EXTRACTION      No Known Allergies  Family History  Problem Relation Age of Onset  . Arthritis Mother   . Breast cancer Paternal Aunt   . Depression Paternal Aunt   . Mental illness Father   . Melanoma Father   . Bipolar disorder Father     Social History   Socioeconomic History  . Marital status: Married    Spouse name: Not on file  . Number of children: 1  . Years of education: Not on file  . Highest education level: Not on file  Occupational History  . Occupation: Data entry    Employer: [employer]  . Financial resource strain: Not on file  . Food insecurity    Worry: Not on file    Inability: Not on file  . Transportation needs    Medical: Not on file    Non-medical: Not on file  Tobacco Use  . Smoking status: Former Smoker    Packs/day: 0.00    Quit date: 09/07/1998    Years since quitting:  20.9  . Smokeless tobacco: Never Used  Substance and Sexual Activity  . Alcohol use: Yes    Comment: occasionally  . Drug use: Yes    Frequency: 2.0 times per week    Types: Marijuana  . Sexual activity: Yes    Partners: Male    Birth control/protection: Pill    Comment: microgestin  Lifestyle  . Physical activity    Days per week: Not on file    Minutes per session: Not on file  . Stress: Not on file  Relationships  . Social Herbalist on phone: Not on file    Gets together: Not on file    Attends religious service: Not on file    Active member of club or organization: Not on file    Attends meetings of clubs or organizations: Not on file    Relationship status: Not on file  Other Topics Concern  . Not on file  Social History Narrative   Works in customer service   Married    Vitals:   08/14/19 0740  BP: 130/78  Pulse: 91  Resp: 16  Temp: 97.6 F (36.4 C)  SpO2: 97%   Body mass index is 47.81 kg/m.   Wt Readings from Last 3 Encounters:  08/14/19 244 lb 12.8 oz (111 kg)  12/26/18 245 lb 8 oz (111.4  kg)  06/27/18 245 lb 4 oz (111.2 kg)    Physical Exam  Nursing note and vitals reviewed. Constitutional: She is oriented to person, place, and time. She appears well-developed,obese. No distress.  HENT:  Head: Normocephalic and atraumatic.  Right Ear: Hearing, tympanic membrane, external ear and ear canal normal.  Left Ear: Hearing, tympanic membrane, external ear and ear canal normal.  Mouth/Throat: Uvula is midline, oropharynx is clear and moist and mucous membranes are normal.  Eyes: Pupils are equal, round, and reactive to light. Conjunctivae and EOM are normal.  Neck: No tracheal deviation present. No thyromegaly present.  Cardiovascular: Normal rate and regular rhythm.  No murmur heard. Pulses:      Dorsalis pedis pulses are 2+ on the right side, and 2+ on the left side.  Respiratory: Effort normal and breath sounds normal. No respiratory  distress.  GI: Soft. She exhibits no mass. There is no hepatomegaly. There is no tenderness.  Genitourinary:Comments: Deferred to gyn.  Musculoskeletal: She exhibits no edema.  No major deformity or signs of synovitis appreciated.  Lymphadenopathy:    She has no cervical adenopathy.       Right: No supraclavicular adenopathy present.       Left: No supraclavicular adenopathy present.  Neurological: She is alert and oriented to person, place, and time. She has normal strength. No cranial nerve deficit. Coordination and gait normal.  Reflex Scores:      Bicep reflexes are 2+ on the right side and 2+ on the left side.      Patellar reflexes are 2+ on the right side and 2+ on the left side. Skin: Skin is warm. No rash noted. No erythema.  Psychiatric: She has a normal mood and affect. Cognitive function grossly intact. Well groomed, good eye contact.   ASSESSMENT AND PLAN:  Ms. DESHAUN SCHOU was here today annual physical examination.   Orders Placed This Encounter  Procedures  . Flu Vaccine QUAD 36+ mos IM  . Microalbumin / creatinine urine ratio  . Fructosamine  . Basic metabolic panel  . Lipid panel  . Hemoglobin A1c  . CBC with Differential  . VITAMIN D 25 Hydroxy (Vit-D Deficiency, Fractures)  . LDL cholesterol, direct  . EKG 12-Lead    Lab Results  Component Value Date   HGBA1C 6.6 (H) 08/14/2019   Lab Results  Component Value Date   CHOL 113 08/14/2019   HDL 38.40 (L) 08/14/2019   LDLDIRECT 37.0 08/14/2019   TRIG 279.0 (H) 08/14/2019   CHOLHDL 3 08/14/2019   Lab Results  Component Value Date   CREATININE 0.70 08/14/2019   BUN 13 08/14/2019   NA 138 08/14/2019   K 4.0 08/14/2019   CL 103 08/14/2019   CO2 26 08/14/2019   Lab Results  Component Value Date   MICROALBUR 4.0 (H) 08/14/2019   Lab Results  Component Value Date   WBC 14.0 (H) 08/14/2019   HGB 12.7 08/14/2019   HCT 38.6 08/14/2019   MCV 83.0 08/14/2019   PLT 439.0 (H) 08/14/2019      Routine general medical examination at a health care facility We discussed the importance of regular physical activity and healthy diet for prevention of chronic illness and/or complications. Preventive guidelines reviewed. Vaccination up to date. She will continue following with gynecologist for her female preventive care. Next CPE in a year..  Type 2 diabetes mellitus without complication, without long-term current use of insulin ([place]) HgA1C pending. No changes in current management,will adjust according  to results. Regular exercise and healthy diet with avoidance of added sugar food intake is an important part of treatment and recommended. Annual eye exam, periodic dental and foot care recommended. F/U in 5-6 months   Mixed hyperlipidemia No changes in current management, will follow labs done today and will give further recommendations accordingly.  Essential hypertension BP adequately controlled. Low salt diet. Monitor BP at home. Continue Metoprolol Tartrate 25 mg bid.   Leukocytosis, unspecified type It has been stable.  -     CBC with Differential  Heart palpitations Hx and examination do not suggest a serious process. Reports negative cardia work up 2 years ago. EKG today normal otherwise. Recommend taking an extra Metoprolol 25 mg daily as needed.  -     metoprolol tartrate (LOPRESSOR) 25 MG tablet; 1 tablet twice daily and an extra metoprolol 25 mg daily as needed for palpitations.  Vitamin D deficiency, unspecified She is not on vit D supplementation. Further recommendations will be given according to 25 [place] vit D results.  Need for immunization against influenza -     Flu Vaccine QUAD 36+ mos IM  Other orders -     LDL cholesterol, direct   Return in 6 months (on 02/12/2020).    Tachina Spoonemore G. Martin[place], MD  [facility]. [place] office.

## 2019-08-14 NOTE — Patient Instructions (Addendum)
Today you have you routine preventive visit.  At least 150 minutes of moderate exercise per week, daily brisk walking for 15-30 min is a good exercise option. Healthy diet low in saturated (animal) fats and sweets and consisting of fresh fruits and vegetables, lean meats such as fish and white chicken and whole grains.  These are some of recommendations for screening depending of age and risk factors: A few things to remember from today's visit:   Routine general medical examination at a health care facility  Type 2 diabetes mellitus without complication, without long-term current use of insulin ([place]) - Plan: Microalbumin / creatinine urine ratio, Fructosamine, Hemoglobin A1c  Mixed hyperlipidemia - Plan: Lipid panel  Essential hypertension - Plan: Basic metabolic panel, EKG 83-KFMM  Leukocytosis, unspecified type - Plan: CBC with Differential  Heart palpitations - Plan: metoprolol tartrate (LOPRESSOR) 25 MG tablet  Vitamin D deficiency, unspecified - Plan: VITAMIN D 25 Hydroxy (Vit-D Deficiency, Fractures)  Take an extra metoprolol 25 mg daily as needed for palpitations. Continue metoprolol titrate 25 mg twice daily. Continue monitoring pulse and blood pressure periodically.   - Vaccines:  Tdap vaccine every 10 years.  Shingles vaccine recommended at age 63, could be given after 43 years of age but not sure about insurance coverage.   Pneumonia vaccines:  Prevnar 13 at 65 and Pneumovax at 60. Sometimes Pneumovax is giving earlier if history of smoking, lung disease,diabetes,kidney disease among some.  Cervical cancer prevention:  Pap smear starts at 43 years of age and continues periodically until 43 years old in low risk women. Pap smear every 3 years between 80 and 62 years old. Pap smear every 3-5 years between women 39 and older if pap smear negative and HPV screening negative.   -Breast cancer: Mammogram: There is disagreement between experts about when to start  screening in low risk asymptomatic female but recent recommendations are to start screening at 66 and not later than 43 years old , every 1-2 years and after 43 yo q 2 years. Screening is recommended until 42 years old but some women can continue screening depending of healthy issues.   Colon cancer screening: starts at 43 years old until 43 years old.  Also recommended:  1. Dental visit- Brush and floss your teeth twice daily; visit your dentist twice a year. 2. Eye doctor- Get an eye exam at least every 2 years. 3. Helmet use- Always wear a helmet when riding a bicycle, motorcycle, rollerblading or skateboarding. 4. Safe sex- If you may be exposed to sexually transmitted infections, use a condom. 5. Seat belts- Seat belts can save your live; always wear one. 6. Smoke/Carbon Monoxide detectors- These detectors need to be installed on the appropriate level of your home. Replace batteries at least once a year. 7. Skin cancer- When out in the sun please cover up and use sunscreen 15 SPF or higher. 8. Violence- If anyone is threatening or hurting you, please tell your healthcare provider.  9. Drink alcohol in moderation- Limit alcohol intake to one drink or less per day. Never drink and drive.

## 2019-08-18 MED ORDER — ERGOCALCIFEROL 1.25 MG (50000 UT) PO CAPS: ORAL_CAPSULE | ORAL | 1 refills | Status: AC

## 2019-08-21 LAB — FRUCTOSAMINE: Fructosamine: 197 umol/L — ABNORMAL LOW (ref 205–285)

## 2019-08-22 ENCOUNTER — Encounter: Payer: Self-pay | Admitting: Family Medicine

## 2019-08-31 NOTE — Telephone Encounter (Unsigned)
Copied from [place] [id number]. Topic: General - Call Back - No Documentation >> Aug 23, 2019  3:55 PM Richardo Priest, [place] wrote: Reason for CRM: Patient called in stating they are returning a call from [place]. Please advise.

## 2019-09-06 ENCOUNTER — Other Ambulatory Visit: Payer: Self-pay | Admitting: Family Medicine

## 2020-06-07 ENCOUNTER — Other Ambulatory Visit: Payer: Self-pay | Admitting: Family Medicine

## 2020-06-07 DIAGNOSIS — E119 Type 2 diabetes mellitus without complications: Secondary | ICD-10-CM

## 2020-07-25 ENCOUNTER — Other Ambulatory Visit: Payer: Self-pay | Admitting: Family Medicine

## 2020-07-28 ENCOUNTER — Other Ambulatory Visit: Payer: Self-pay | Admitting: Family Medicine

## 2020-07-28 DIAGNOSIS — R002 Palpitations: Secondary | ICD-10-CM

## 2020-12-30 ENCOUNTER — Other Ambulatory Visit: Payer: Self-pay | Admitting: Family Medicine

## 2020-12-30 DIAGNOSIS — E119 Type 2 diabetes mellitus without complications: Secondary | ICD-10-CM

## 2021-01-29 ENCOUNTER — Other Ambulatory Visit: Payer: Self-pay | Admitting: Family Medicine

## 2021-01-29 DIAGNOSIS — E119 Type 2 diabetes mellitus without complications: Secondary | ICD-10-CM

## 2021-02-28 ENCOUNTER — Other Ambulatory Visit: Payer: Self-pay | Admitting: Family Medicine

## 2021-02-28 DIAGNOSIS — E119 Type 2 diabetes mellitus without complications: Secondary | ICD-10-CM

## 2021-03-30 ENCOUNTER — Other Ambulatory Visit: Payer: Self-pay | Admitting: Family Medicine

## 2021-03-30 DIAGNOSIS — E119 Type 2 diabetes mellitus without complications: Secondary | ICD-10-CM

## 2021-08-27 LAB — COLOGUARD: COLOGUARD: NEGATIVE

## 2024-01-24 ENCOUNTER — Other Ambulatory Visit: Payer: Self-pay | Admitting: Obstetrics & Gynecology

## 2024-04-06 ENCOUNTER — Ambulatory Visit (HOSPITAL_COMMUNITY): Admission: RE | Admit: 2024-04-06 | Source: Home / Self Care | Admitting: Obstetrics & Gynecology

## 2024-04-06 SURGERY — SALPINGECTOMY, ROBOT-ASSISTED
Anesthesia: General | Laterality: Bilateral
